# Patient Record
Sex: Female | Born: 1940 | Race: White | Hispanic: No | Smoking: Never smoker
Health system: Southern US, Community
[De-identification: ages and names within clinical notes are randomized; demographics above are authoritative.]

## PROBLEM LIST (undated history)

## (undated) DIAGNOSIS — F0391 Unspecified dementia with behavioral disturbance: Secondary | ICD-10-CM

## (undated) DIAGNOSIS — F0280 Dementia in other diseases classified elsewhere without behavioral disturbance: Secondary | ICD-10-CM

## (undated) DIAGNOSIS — F03918 Unspecified dementia, unspecified severity, with other behavioral disturbance: Secondary | ICD-10-CM

## (undated) DIAGNOSIS — R4701 Aphasia: Secondary | ICD-10-CM

## (undated) DIAGNOSIS — M810 Age-related osteoporosis without current pathological fracture: Secondary | ICD-10-CM

## (undated) DIAGNOSIS — F0390 Unspecified dementia without behavioral disturbance: Secondary | ICD-10-CM

## (undated) DIAGNOSIS — G309 Alzheimer's disease, unspecified: Secondary | ICD-10-CM

## (undated) HISTORY — PX: CATARACT EXTRACTION: SUR2

## (undated) HISTORY — DX: Dementia in other diseases classified elsewhere, unspecified severity, without behavioral disturbance, psychotic disturbance, mood disturbance, and anxiety: F02.80

## (undated) HISTORY — PX: COLONOSCOPY: SHX174

## (undated) HISTORY — DX: Alzheimer's disease, unspecified: G30.9

---

## 1998-07-02 ENCOUNTER — Other Ambulatory Visit: Admission: RE | Admit: 1998-07-02 | Discharge: 1998-07-02 | Payer: Self-pay | Admitting: *Deleted

## 1998-07-03 ENCOUNTER — Other Ambulatory Visit: Admission: RE | Admit: 1998-07-03 | Discharge: 1998-07-03 | Payer: Self-pay | Admitting: *Deleted

## 1998-09-05 ENCOUNTER — Other Ambulatory Visit: Admission: RE | Admit: 1998-09-05 | Discharge: 1998-09-05 | Payer: Self-pay | Admitting: *Deleted

## 2000-03-01 ENCOUNTER — Other Ambulatory Visit: Admission: RE | Admit: 2000-03-01 | Discharge: 2000-03-01 | Payer: Self-pay | Admitting: *Deleted

## 2001-06-13 ENCOUNTER — Other Ambulatory Visit: Admission: RE | Admit: 2001-06-13 | Discharge: 2001-06-13 | Payer: Self-pay | Admitting: *Deleted

## 2002-06-26 ENCOUNTER — Other Ambulatory Visit: Admission: RE | Admit: 2002-06-26 | Discharge: 2002-06-26 | Payer: Self-pay | Admitting: *Deleted

## 2004-03-06 ENCOUNTER — Other Ambulatory Visit: Admission: RE | Admit: 2004-03-06 | Discharge: 2004-03-06 | Payer: Self-pay | Admitting: *Deleted

## 2012-05-25 ENCOUNTER — Ambulatory Visit: Payer: Self-pay | Admitting: Family Medicine

## 2012-07-15 DIAGNOSIS — E039 Hypothyroidism, unspecified: Secondary | ICD-10-CM | POA: Insufficient documentation

## 2012-07-28 ENCOUNTER — Other Ambulatory Visit: Payer: Self-pay | Admitting: Chiropractor

## 2012-07-28 ENCOUNTER — Ambulatory Visit
Admission: RE | Admit: 2012-07-28 | Discharge: 2012-07-28 | Disposition: A | Discharge status: Home / Self Care | Payer: Medicare Other | Source: Ambulatory Visit | Attending: Chiropractor | Admitting: Chiropractor

## 2012-07-28 DIAGNOSIS — R52 Pain, unspecified: Secondary | ICD-10-CM

## 2014-03-23 ENCOUNTER — Ambulatory Visit: Payer: Self-pay | Admitting: Family Medicine

## 2014-03-23 LAB — URINALYSIS, COMPLETE
BACTERIA: NEGATIVE
BLOOD: NEGATIVE
Bilirubin,UR: NEGATIVE
GLUCOSE, UR: NEGATIVE mg/dL (ref 0–75)
Ketone: NEGATIVE
NITRITE: NEGATIVE
PH: 6 (ref 4.5–8.0)
PROTEIN: NEGATIVE
Specific Gravity: 1.02 (ref 1.003–1.030)

## 2014-05-15 DIAGNOSIS — M1990 Unspecified osteoarthritis, unspecified site: Secondary | ICD-10-CM | POA: Insufficient documentation

## 2015-07-23 DIAGNOSIS — R4701 Aphasia: Secondary | ICD-10-CM | POA: Insufficient documentation

## 2015-07-23 DIAGNOSIS — G309 Alzheimer's disease, unspecified: Secondary | ICD-10-CM | POA: Insufficient documentation

## 2016-04-07 ENCOUNTER — Ambulatory Visit: Payer: Medicare Other | Attending: Family Medicine | Admitting: Physical Therapy

## 2016-04-07 DIAGNOSIS — R293 Abnormal posture: Secondary | ICD-10-CM | POA: Insufficient documentation

## 2016-04-07 DIAGNOSIS — Z9181 History of falling: Secondary | ICD-10-CM

## 2016-04-07 DIAGNOSIS — M6281 Muscle weakness (generalized): Secondary | ICD-10-CM | POA: Insufficient documentation

## 2016-04-07 NOTE — Therapy (Signed)
Sydney Turner [address] [place], [place], [address] Phone: [phone]   Fax:  [phone]  Physical Therapy Evaluation  Patient Details  Name: Sydney Turner MRN: [id number] Date of Birth: [birth year] Referring Provider: Marlowe Alt Turner  Encounter Date: 04/07/2016      PT End of Session - 04/07/16 1709    Visit Number 1   Number of Visits 6   Date for PT Re-Evaluation 05/19/16   PT Start Time 0125   PT Stop Time 0225   PT Time Calculation (min) 60 min   Activity Tolerance Patient tolerated treatment well;Patient limited by fatigue   Behavior During Therapy [facility] for tasks assessed/performed      No past medical history on file.  No past surgical history on file.  There were no vitals filed for this visit.       Subjective Assessment - 04/07/16 1332    Subjective Pt states she has severe foot pain in L foot underneath MT heads due to plantar wart with extended periods of walking. 2 weeks ago she had wart removed. Pt states she gets exercise by walking the dog, moderate distance (approx mile). She states she has had 3 falls in the past with no injuries sustained. States tension in neck but no pain.   Patient is accompained by: Family member   Limitations House hold activities   Patient Stated Goals pt would like to increase strength and endurance to become indep with exercise program   Currently in Pain? No/denies            PT Long Term Goals - 04/07/16 1432    PT LONG TERM GOAL #1   Title Pt will be able to perform SLS on BLE for 2 minutes without UE support to improve balance and decrease fall risk   Baseline pt with LOB with SLS on LLE, able to perform 1 minute SLS on RLE   Time 6   Period Weeks   Status New   PT LONG TERM GOAL #2   Title Pt will improve MMT scores by half grade to progress strength and improve functional mobility   Baseline R/L hip flexion 4+/4, knee ext/flx 4+/5, plantarflex 4+/5,  shoulder flex 4+/5, biceps 4/4, triceps 4-/4   Time 6   Period Weeks   Status New   PT LONG TERM GOAL #3   Title Pt will be able to tolerate 30 minutes of continuous exercise in therapy clinic to promote independent exercise program post PT   Baseline pt needs to sit after 15 minutes using NuStep at level 5.   Time 6   Period Weeks   Status New      Objective: There ex: standing in // bars pt performed marching x30 B, donkey kicks x15 B, mini squats 2x15 B, calf raises 2x10, lateral walks with UE support 5x81ft. Nu Step 15 minutes level 5.   Pt response for medical necessity: Pt demonstrates mild weakness in LE/UE musculature, endurance deficits, balance deficits and tendency for excess thoracic kyphosis with sitting/standing posture. She will benefit from skilled PT program to address strength deficits, build functional endurance, increase dynamic/static balance ability and promote postural awareness with ultimate goal of progressing toward independent exercise program.         Plan - 04/07/16 1711    Clinical Impression Statement Pt is a pleasant 75 year old woman with dx of osteoarthritis of multiple joints. She presents for PT evaluation with pain in L  foot associated with minor foot surgery, but does not c/o pain limiting functional mobility. Strength testing: R/L hip flexion 4/4+, knee ext 5/5, knee flexion 5/4+, dorsiflexion 5/5, plantarflexion 4+/5, shoulder abd 5/5, flexion 4+/5, elbow flx 4/4, elbow ext 4/4-. ROM: cervical spine WFL for all planes with limited rotation due to "tightness", UE WFL all planes, lumbar flexion WFL, lumbar ext lacking 25%. Pt with moderate thoracic kyphosis, able to self correct 75% with scapular stabilizers. Balance: tandem stance >1 minute, SLS LLE <10 sec, SLS RLE >30sec. 5XSTS: 11.25s, TUG: 8.58sec. Pt will benefit from skilled PT services to address postural abnormality, progress strengthening program for UE/LE and incorporate static/dynamic balance  training to promote safety with household activities and exercise program.   Rehab Potential Good   PT Frequency 1x / week   PT Duration 6 weeks   PT Treatment/Interventions ADLs/Self Care Home Management;Aquatic Therapy;Cryotherapy;Moist Heat;Gait training;Stair training;Functional mobility training;Therapeutic activities;Therapeutic exercise;Balance training;Neuromuscular re-education;Patient/family education;Manual techniques;Passive range of motion   PT Next Visit Plan Progress strengthening program; emphasis on UE and neutral spine   Consulted and Agree with Plan of Care Patient;Family member/caregiver      Patient will benefit from skilled therapeutic intervention in order to improve the following deficits and impairments:  Decreased activity tolerance, Decreased balance, Decreased endurance, Decreased mobility, Decreased range of motion, Decreased safety awareness, Decreased strength, Hypomobility, Impaired perceived functional ability, Impaired flexibility, Improper body mechanics, Postural dysfunction, Pain  Visit Diagnosis: Muscle weakness (generalized)  Abnormal posture  History of falling     Problem List There are no active problems to display for this patient.  Sydney Turner, PT, DPT # [id number] Sydney Turner, SPT   04/07/2016, 5:25 PM   [facility] [address] [place], [place], [address] Phone: [phone]   Fax:  [phone]  Name: Sydney Turner MRN: [id number] Date of Birth: 19-Aug-1941

## 2016-04-08 ENCOUNTER — Encounter: Payer: Self-pay | Admitting: Physical Therapy

## 2016-04-14 ENCOUNTER — Encounter: Payer: Self-pay | Admitting: Physical Therapy

## 2016-04-14 ENCOUNTER — Ambulatory Visit: Payer: Medicare Other | Admitting: Physical Therapy

## 2016-04-14 DIAGNOSIS — M6281 Muscle weakness (generalized): Secondary | ICD-10-CM | POA: Diagnosis not present

## 2016-04-14 DIAGNOSIS — R293 Abnormal posture: Secondary | ICD-10-CM

## 2016-04-14 DIAGNOSIS — Z9181 History of falling: Secondary | ICD-10-CM

## 2016-04-14 NOTE — Therapy (Signed)
[facility] [address] [place], [place], [address] Phone: [phone]   Fax:  [phone]  Physical Therapy Treatment  Patient Details  Name: Sydney Turner MRN: [id number] Date of Birth: Oct 12, 1941 Referring Provider: Marlowe AltStephanie Jeanine Foley  Encounter Date: 04/14/2016      PT End of Session - 04/14/16 1806    Visit Number 2   Number of Visits 6   Date for PT Re-Evaluation 05/19/16   PT Start Time 1600   PT Stop Time 1647   PT Time Calculation (min) 47 min   Activity Tolerance Patient tolerated treatment well;Patient limited by fatigue   Behavior During Therapy [facility] for tasks assessed/performed      History reviewed. No pertinent past medical history.  History reviewed. No pertinent past surgical history.  There were no vitals filed for this visit.      Subjective Assessment - 04/14/16 1805    Subjective Pt reports that she is feeling well overall but did experience some muscle soreness following last therapy session.   Patient is accompained by: Family member   Limitations House hold activities   Patient Stated Goals pt would like to increase strength and endurance to become indep with exercise program   Currently in Pain? No/denies      Objective: Nu Step level 7, 15 minutes (warm up, no charge). In // bars (Scientist, physiologicalfacing mirror for visual feedback): bicep curls 2# 3x10, scap retractions red theraband 3x10 with emphasis on proper posture - "squeezing shoulder blades" and activating abdominals, shoulder flx/abd #2 3x10 BUE, punches with 2# on BUE with emphasis on open chest posture, standing hip 3 way (ant/lat/post) 2x15 bilat, standing abd 3x10 with cueing for neutral spine due to pt tendency for lateral lean, calf raises 3x10, standing donkey kicks 2x10, sit to stand 1x15. Pt with tendency to use momentum during there ex, requires cueing to slow down movement and focus on control. Pt requires frequent cueing for exercise set  up, but able to perform with supervision.  Pt response for medical necessity: Pt demonstrates mild weakness in LE/UE musculature, endurance deficits, balance deficits and tendency for excess thoracic kyphosis with sitting/standing posture. She will benefit from skilled PT program to address strength deficits, build functional endurance, increase dynamic/static balance ability and promote postural awareness with ultimate goal of progressing toward independent exercise program.        PT Long Term Goals - 04/07/16 1432    PT LONG TERM GOAL #1   Title Pt will be able to perform SLS on BLE for 2 minutes without UE support to improve balance and decrease fall risk   Baseline pt with LOB with SLS on LLE, able to perform 1 minute SLS on RLE   Time 6   Period Weeks   Status New   PT LONG TERM GOAL #2   Title Pt will improve MMT scores by half grade to progress strength and improve functional mobility   Baseline R/L hip flexion 4+/4, knee ext/flx 4+/5, plantarflex 4+/5, shoulder flex 4+/5, biceps 4/4, triceps 4-/4   Time 6   Period Weeks   Status New   PT LONG TERM GOAL #3   Title Pt will be able to tolerate 30 minutes of continuous exercise in therapy clinic to promote independent exercise program post PT   Baseline pt needs to sit after 15 minutes using NuStep at level 5.   Time 6   Period Weeks   Status New  Plan - 04/14/16 1806    Clinical Impression Statement Pt requires frequent cueing for posture with tendency for thoracic kyphosis during UE tasks. Pt requires frequent cueing for exercise set up with tendency for compensatory strategies with LE standing ther ex. Able to respond to verbal/visual cueing to correct exercises.   Rehab Potential Good   PT Frequency 1x / week   PT Duration 6 weeks   PT Treatment/Interventions ADLs/Self Care Home Management;Aquatic Therapy;Cryotherapy;Moist Heat;Gait training;Stair training;Functional mobility training;Therapeutic  activities;Therapeutic exercise;Balance training;Neuromuscular re-education;Patient/family education;Manual techniques;Passive range of motion   PT Next Visit Plan Progress strengthening program; emphasis on UE and neutral spine   Consulted and Agree with Plan of Care Patient;Family member/caregiver      Patient will benefit from skilled therapeutic intervention in order to improve the following deficits and impairments:  Decreased activity tolerance, Decreased balance, Decreased endurance, Decreased mobility, Decreased range of motion, Decreased safety awareness, Decreased strength, Hypomobility, Impaired perceived functional ability, Impaired flexibility, Improper body mechanics, Postural dysfunction, Pain  Visit Diagnosis: Muscle weakness (generalized)  Abnormal posture  History of falling     Problem List There are no active problems to display for this patient.  Cammie McgeeMichael C Sherk, PT, DPT # [id number] Michaelyn BarterLaura Dally Oshel, SPT  04/15/2016, 8:03 AM  [facility] [address] [place], [place], [address] Phone: [phone]   Fax:  [phone]  Name: Sydney Turner MRN: [id number] Date of Birth: 1941/01/28

## 2016-04-22 ENCOUNTER — Encounter: Payer: Self-pay | Admitting: Physical Therapy

## 2016-04-22 ENCOUNTER — Ambulatory Visit: Payer: Medicare Other | Attending: Family Medicine

## 2016-04-22 DIAGNOSIS — R293 Abnormal posture: Secondary | ICD-10-CM | POA: Diagnosis present

## 2016-04-22 DIAGNOSIS — Z9181 History of falling: Secondary | ICD-10-CM | POA: Diagnosis present

## 2016-04-22 DIAGNOSIS — M6281 Muscle weakness (generalized): Secondary | ICD-10-CM | POA: Diagnosis present

## 2016-04-22 NOTE — Therapy (Signed)
[facility] [address] [place], [place], [address] Phone: [phone]   Fax:  [phone]  Physical Therapy Treatment  Patient Details  Name: Sydney GumMargaret K Gellatly MRN: [id number] Date of Birth: July 12, 1941 Referring Provider: Marlowe AltStephanie Jeanine Foley  Encounter Date: 04/22/2016      PT End of Session - 04/22/16 1718    Visit Number 3   Number of Visits 6   Date for PT Re-Evaluation 05/19/16   PT Start Time 1415   PT Stop Time 1500   PT Time Calculation (min) 45 min   Activity Tolerance Patient tolerated treatment well   Behavior During Therapy [facility] for tasks assessed/performed      History reviewed. No pertinent past medical history.  History reviewed. No pertinent past surgical history.  There were no vitals filed for this visit.      Subjective Assessment - 04/22/16 1718    Subjective Pt states she enjoyed last therapy session and felt good about strengthening program. States that she is trying to be more aware of her overall posture during functional tasks.   Patient is accompained by: Family member   Limitations House hold activities   Patient Stated Goals pt would like to increase strength and endurance to become indep with exercise program   Currently in Pain? No/denies      Objective: Nu Step level 5, 15 minutes (warm up, no charge). In // bars (Scientist, physiologicalfacing mirror for visual feedback): bicep curls 2# 3x10, scap retractions red theraband 3x10 with emphasis on proper posture - "squeezing shoulder blades" and activating abdominals, shoulder flx/abd #2 3x10 BUE, punches with 2# on BUE with emphasis on open chest posture, D1/D2 pattern with #1 BUE 2x10, lumbar stretches ROT R/L LF R/L x10. Standing knee flexion #4 ankle weights 3x10 bilat. Standing marching #4 ankle weights 3x10 bilat. Pt with tendency to use momentum during there ex, requires cueing to slow down movement and focus on control. Pt requires frequent cueing for exercise  set up, but able to perform with supervision.  Pt response for medical necessity: Pt demonstrates mild weakness in LE/UE musculature, endurance deficits, balance deficits and tendency for excess thoracic kyphosis with sitting/standing posture. She will benefit from skilled PT program to address strength deficits, build functional endurance, increase dynamic/static balance ability and promote postural awareness with ultimate goal of progressing toward independent exercise program.      PT Long Term Goals - 04/07/16 1432    PT LONG TERM GOAL #1   Title Pt will be able to perform SLS on BLE for 2 minutes without UE support to improve balance and decrease fall risk   Baseline pt with LOB with SLS on LLE, able to perform 1 minute SLS on RLE   Time 6   Period Weeks   Status New   PT LONG TERM GOAL #2   Title Pt will improve MMT scores by half grade to progress strength and improve functional mobility   Baseline R/L hip flexion 4+/4, knee ext/flx 4+/5, plantarflex 4+/5, shoulder flex 4+/5, biceps 4/4, triceps 4-/4   Time 6   Period Weeks   Status New   PT LONG TERM GOAL #3   Title Pt will be able to tolerate 30 minutes of continuous exercise in therapy clinic to promote independent exercise program post PT   Baseline pt needs to sit after 15 minutes using NuStep at level 5.   Time 6   Period Weeks   Status New  Plan - 04/22/16 1719    Clinical Impression Statement Pt continues to require frequent cueing to promote neutral spine especially during reaching tasks; responds best to tactile cueing on anterior shoulder/scapula. Demonstrates good response to progressive strengthening program with no c/o of pain or discomfort during session.   Rehab Potential Good   PT Frequency 1x / week   PT Duration 6 weeks   PT Treatment/Interventions ADLs/Self Care Home Management;Aquatic Therapy;Cryotherapy;Moist Heat;Gait training;Stair training;Functional mobility training;Therapeutic  activities;Therapeutic exercise;Balance training;Neuromuscular re-education;Patient/family education;Manual techniques;Passive range of motion   PT Next Visit Plan Progress strengthening program; emphasis on UE and neutral spine   Consulted and Agree with Plan of Care Patient;Family member/caregiver      Patient will benefit from skilled therapeutic intervention in order to improve the following deficits and impairments:  Decreased activity tolerance, Decreased balance, Decreased endurance, Decreased mobility, Decreased range of motion, Decreased safety awareness, Decreased strength, Hypomobility, Impaired perceived functional ability, Impaired flexibility, Improper body mechanics, Postural dysfunction, Pain  Visit Diagnosis: Muscle weakness (generalized)  Abnormal posture  History of falling     Problem List There are no active problems to display for this patient.  This entire session was performed under direct supervision and direction of a licensed therapist/therapist assistant . I have personally read, edited and approve of the note as written.   Vernona RiegerLaura Anson Peddie, SPT Huprich,Jason DPT 04/23/2016, 11:37 AM  [place] [facility] [address] [place], [place], [address] Phone: [phone]   Fax:  [phone]  Name: Sydney GumMargaret K Mayberry MRN: [id number] Date of Birth: 1941-05-30

## 2016-04-29 ENCOUNTER — Ambulatory Visit: Payer: Medicare Other

## 2016-04-29 DIAGNOSIS — M6281 Muscle weakness (generalized): Secondary | ICD-10-CM

## 2016-04-29 DIAGNOSIS — Z9181 History of falling: Secondary | ICD-10-CM

## 2016-04-29 DIAGNOSIS — R293 Abnormal posture: Secondary | ICD-10-CM

## 2016-04-29 NOTE — Therapy (Signed)
[facility] [facility] [address] [place], [place], [address] Phone: [phone]   Fax:  [phone]  Physical Therapy Treatment  Patient Details  Name: Sydney Turner MRN: [id number] Date of Birth: [birth year] Referring Provider: Marlowe Alt Foley  Encounter Date: 04/29/2016      PT End of Session - 04/29/16 1708    Visit Number 4   Number of Visits 6   Date for PT Re-Evaluation 05/19/16   PT Start Time 1415   PT Stop Time 1500   PT Time Calculation (min) 45 min   Activity Tolerance Patient tolerated treatment well   Behavior During Therapy [facility] for tasks assessed/performed      History reviewed. No pertinent past medical history.  History reviewed. No pertinent past surgical history.  There were no vitals filed for this visit.      Subjective Assessment - 04/29/16 1706    Subjective Pt states she is doing well and has been walking the dog regularly. Reports no increase in muscle soreness or pain folllowing last session and states she is ready for more therapy.   Patient is accompained by: Family member   Limitations House hold activities   Patient Stated Goals pt would like to increase strength and endurance to become indep with exercise program   Currently in Pain? No/denies      Objective: Nu Step level 6, 15 minutes (warm up, no charge). In // bars (Scientist, physiological for visual feedback): bicep curls 2# 3x10, scap retractions red theraband 3x10 with emphasis on proper posture - "squeezing shoulder blades" and activating abdominals, shoulder flx/abd #2 3x10 BUE, punches with 2# on BUE with emphasis on open chest posture, ER/IR 2x10 with yellow theraband - pt limited in range with tendency for wrist flexion, lumbar stretches ROT R/L LF R/L x10. Standing marching 3x10 bilat. Standing R/L abduction and extension 2x10 with emphasis on neutral lumbar spine and UE prn. Standing stretches: gastroc stretch 45sec bilaterally, hamstring  stretch 45sec bilaterally, quad stretch 45sec bilaterally. Pt reporting tension in posterior leg during hamstring stretch R>L. Pt with tendency to use momentum during there ex, requires cueing to slow down movement and focus on control. Pt requires frequent cueing for exercise set up and benefits from tactile cueing.  Pt response for medical necessity: Pt demonstrates mild weakness in LE/UE musculature, endurance deficits, balance deficits and tendency for excess thoracic kyphosis with sitting/standing posture. She will benefit from skilled PT program to address strength deficits, build functional endurance, increase dynamic/static balance ability and promote postural awareness with ultimate goal of progressing toward independent exercise program.       PT Education - 04/29/16 1707    Education provided Yes   Education Details Emphasis on posture - neutral spine during LE ther ex and scapular retraction during UE ther ex   Person(s) Educated Patient   Methods Explanation   Comprehension Verbalized understanding;Returned demonstration             PT Long Term Goals - 04/07/16 1432    PT LONG TERM GOAL #1   Title Pt will be able to perform SLS on BLE for 2 minutes without UE support to improve balance and decrease fall risk   Baseline pt with LOB with SLS on LLE, able to perform 1 minute SLS on RLE   Time 6   Period Weeks   Status New   PT LONG TERM GOAL #2   Title Pt will improve MMT scores by half grade  to progress strength and improve functional mobility   Baseline R/L hip flexion 4+/4, knee ext/flx 4+/5, plantarflex 4+/5, shoulder flex 4+/5, biceps 4/4, triceps 4-/4   Time 6   Period Weeks   Status New   PT LONG TERM GOAL #3   Title Pt will be able to tolerate 30 minutes of continuous exercise in therapy clinic to promote independent exercise program post PT   Baseline pt needs to sit after 15 minutes using NuStep at level 5.   Time 6   Period Weeks   Status New                Plan - 04/29/16 1708    Clinical Impression Statement Pt responsive to verbal/tactile cueing to address correct posture for therapeutic exercise. She responds best to imitation in exercise instruction and benefits from tactile cueing for any corrections. She continues to require cueing for thoracic posture during UE tasks and maintaning neutral lumbar spine during LE tasks. Demonstrates weakness of shoulder ER/IR with targeted ER/IR with yellow theraband.   Rehab Potential Good   PT Frequency 1x / week   PT Duration 6 weeks   PT Treatment/Interventions ADLs/Self Care Home Management;Aquatic Therapy;Cryotherapy;Moist Heat;Gait training;Stair training;Functional mobility training;Therapeutic activities;Therapeutic exercise;Balance training;Neuromuscular re-education;Patient/family education;Manual techniques;Passive range of motion   PT Next Visit Plan Progress strengthening program; emphasis on UE and neutral spine   Consulted and Agree with Plan of Care Patient;Family member/caregiver      Patient will benefit from skilled therapeutic intervention in order to improve the following deficits and impairments:  Decreased activity tolerance, Decreased balance, Decreased endurance, Decreased mobility, Decreased range of motion, Decreased safety awareness, Decreased strength, Hypomobility, Impaired perceived functional ability, Impaired flexibility, Improper body mechanics, Postural dysfunction, Pain  Visit Diagnosis: Muscle weakness (generalized)  Abnormal posture  History of falling     Problem List There are no active problems to display for this patient.  Sydney Turner SPT Sydney Turner,Sydney Turner 04/30/2016, 10:35 AM  [place] [facility] [address] [place], [place], [address] Phone: [phone]   Fax:  [phone]  Name: Sydney Turner MRN: [id number] Date of Birth: 1941-07-14

## 2016-05-06 ENCOUNTER — Ambulatory Visit: Payer: Medicare Other | Admitting: Physical Therapy

## 2016-05-06 ENCOUNTER — Encounter: Payer: Self-pay | Admitting: Physical Therapy

## 2016-05-06 DIAGNOSIS — Z9181 History of falling: Secondary | ICD-10-CM

## 2016-05-06 DIAGNOSIS — M6281 Muscle weakness (generalized): Secondary | ICD-10-CM | POA: Diagnosis not present

## 2016-05-06 DIAGNOSIS — R293 Abnormal posture: Secondary | ICD-10-CM

## 2016-05-06 NOTE — Therapy (Signed)
[facility] [address] [place], [place], [address] Phone: [phone]   Fax:  [phone]  Physical Therapy Treatment  Patient Details  Name: Prescott GumMargaret K Benscoter MRN: [id number] Date of Birth: 07-Jul-1941 Referring Provider: Marlowe AltStephanie Jeanine Foley  Encounter Date: 05/06/2016      PT End of Session - 05/06/16 1509    Visit Number 5   Number of Visits 6   Date for PT Re-Evaluation 05/19/16   PT Start Time 1402   PT Stop Time 1447   PT Time Calculation (min) 45 min   Activity Tolerance Patient tolerated treatment well   Behavior [facility] for tasks assessed/performed      History reviewed. No pertinent past medical history.  History reviewed. No pertinent past surgical history.  There were no vitals filed for this visit.      Subjective Assessment - 05/06/16 1508    Subjective Pt arrives at PT session wearing clogs instead of gym shoes. States that her husband forgot to remind her to change shoes. States that she got a massage and saw reflexologist yesterday.   Patient is accompained by: Family member   Limitations House hold activities   Patient Stated Goals pt would like to increase strength and endurance to become indep with exercise program   Currently in Pain? No/denies      Objective: SciFit level 6, 15 minutes (warm up, no charge). In // bars (Scientist, physiologicalfacing mirror for visual feedback): bicep curls 2# 3x10, scap retractions red theraband 3x10 with emphasis on proper posture - "squeezing shoulder blades" and activating abdominals, punches with 2# on BUE with emphasis on open chest posture, tricep ext 2x10 with yellow theraband - pt with tendency for wrist flexion and difficulty performing UE tasks without moderate-max verbal/visual/tactile cueing this session. Standing marching 3x10 bilat. Mini squat x30 with emphasis on hip hinge and reduced knee flexion; verbal cueing to increase base of support. Heel raises x30 with UE  support on // bars prn. Standing stretches: gastroc stretch 45sec bilaterally, hamstring stretch 45sec bilaterally, quad stretch 45sec bilaterally.   Pt response for medical necessity: Pt demonstrates mild weakness in LE/UE musculature, endurance deficits, balance deficits and tendency for excess thoracic kyphosis with sitting/standing posture. She will benefit from skilled PT program to address strength deficits, build functional endurance, increase dynamic/static balance ability and promote postural awareness with ultimate goal of progressing toward independent exercise program.       PT Long Term Goals - 04/07/16 1432    PT LONG TERM GOAL #1   Title Pt will be able to perform SLS on BLE for 2 minutes without UE support to improve balance and decrease fall risk   Baseline pt with LOB with SLS on LLE, able to perform 1 minute SLS on RLE   Time 6   Period Weeks   Status New   PT LONG TERM GOAL #2   Title Pt will improve MMT scores by half grade to progress strength and improve functional mobility   Baseline R/L hip flexion 4+/4, knee ext/flx 4+/5, plantarflex 4+/5, shoulder flex 4+/5, biceps 4/4, triceps 4-/4   Time 6   Period Weeks   Status New   PT LONG TERM GOAL #3   Title Pt will be able to tolerate 30 minutes of continuous exercise in therapy clinic to promote independent exercise program post PT   Baseline pt needs to sit after 15 minutes using NuStep at level 5.   Time 6  Period Weeks   Status New            Plan - 05/06/16 1510    Clinical Impression Statement Pt demonstrates moderate difficulty performing exercises today with need for repeated verbal/tactile/visual cueing for majority of ther ex. She requires repeated cueing for postural correction with difficulty with carry over. Demonstrates functional UE/LE strength and overrall good mobility.    Rehab Potential Good   PT Frequency 1x / week   PT Duration 6 weeks   PT Treatment/Interventions ADLs/Self Care Home  Management;Aquatic Therapy;Cryotherapy;Moist Heat;Gait training;Stair training;Functional mobility training;Therapeutic activities;Therapeutic exercise;Balance training;Neuromuscular re-education;Patient/family education;Manual techniques;Passive range of motion   PT Next Visit Plan Progress strengthening program; emphasis on UE and neutral spine   Consulted and Agree with Plan of Care Patient;Family member/caregiver      Patient will benefit from skilled therapeutic intervention in order to improve the following deficits and impairments:  Decreased activity tolerance, Decreased balance, Decreased endurance, Decreased mobility, Decreased range of motion, Decreased safety awareness, Decreased strength, Hypomobility, Impaired perceived functional ability, Impaired flexibility, Improper body mechanics, Postural dysfunction, Pain  Visit Diagnosis: Muscle weakness (generalized)  Abnormal posture  History of falling     Problem List There are no active problems to display for this patient.  Cammie Mcgee, PT, DPT # [id number] Vernona Rieger Betta Balla SPT 05/06/2016, 3:12 PM  [facility] [address] [place], [place], [address] Phone: [phone]   Fax:  [phone]  Name: FERNANDO TORRY MRN: [id number] Date of Birth: 1941-08-11

## 2016-05-13 ENCOUNTER — Ambulatory Visit: Payer: Medicare Other | Admitting: Physical Therapy

## 2016-05-13 ENCOUNTER — Encounter: Payer: Self-pay | Admitting: Physical Therapy

## 2016-05-13 DIAGNOSIS — M6281 Muscle weakness (generalized): Secondary | ICD-10-CM

## 2016-05-13 DIAGNOSIS — Z9181 History of falling: Secondary | ICD-10-CM

## 2016-05-13 DIAGNOSIS — R293 Abnormal posture: Secondary | ICD-10-CM

## 2016-05-13 NOTE — Therapy (Addendum)
[facility] [address] [place], [place], [address] Phone: [phone]   Fax:  [phone]  Physical Therapy Treatment/Discharge Summary  Patient Details  Name: Sydney Turner MRN: [id number] Date of Birth: 03/30/1941 Referring Provider: Maryagnes Amos Foley  Encounter Date: 05/13/2016      PT End of Session - 05/13/16 1750    Visit Number 6   Number of Visits 6   Date for PT Re-Evaluation 05/19/16   PT Start Time 7026   PT Stop Time 1501   PT Time Calculation (min) 49 min   Activity Tolerance Patient tolerated treatment well   Behavior During Therapy [facility] for tasks assessed/performed      History reviewed. No pertinent past medical history.  History reviewed. No pertinent surgical history.  There were no vitals filed for this visit.      Subjective Assessment - 05/13/16 1748    Subjective Pt states she is wearing the correct shoes for physical therapy today and remembered to wear her sneakers. States that the hives on her LE have resolved and she is back to walking the dog regularly.    Patient is accompained by: Family member   Limitations House hold activities   Patient Stated Goals pt would like to increase strength and endurance to become indep with exercise program   Currently in Pain? No/denies      Objective: Nu Step level 5, 15 minutes (warm up, no charge). In // bars (Press photographer for visual feedback): bicep curls with red theraband 2x10, scap retractions 3x10 with emphasis on proper posture; modified punches with 2# on BUE with emphasis on open chest posture, shoulder ext 2x10 with red theraband - pt benefits from imitation of husband when performing majority of UE exercise. Standing hip abd/hip ext 3x10 bilat with cueing for neutral spine to limit compensations; pt with tendency for lateral trunk lean when performing hip abd. Mini squat x30 with emphasis on hip hinge. Heel raises x30 with UE support on //  bars prn; pt unable to perform single leg heel raise but with good tolerance of bilateral heel raise and able to perform without UE assist.  Pt response for medical necessity: Pt demonstrates improvement in LE strength and balance with supervised physical therapy. She is able to perform HEP without therapist supervision but with support of her husband for proper set up. Will progress to home based program to promote longterm functional mobility.       PT Education - 05/13/16 1750    Education provided Yes   Education Details HEP issued   Person(s) Educated Patient   Methods Explanation;Demonstration;Handout   Comprehension Verbalized understanding;Returned demonstration;Verbal cues required;Tactile cues required             PT Long Term Goals - 05/13/16 1752      PT LONG TERM GOAL #1   Title Pt will be able to perform SLS on BLE for 2 minutes without UE support to improve balance and decrease fall risk   Baseline pt with LOB with SLS on LLE, able to perform 1 minute SLS on RLE   Time 6   Period Weeks   Status Partially Met     PT LONG TERM GOAL #2   Title Pt will improve MMT scores by half grade to progress strength and improve functional mobility   Baseline R/L hip flexion 4+/4, knee ext/flx 4+/5, plantarflex 4+/5, shoulder flex 4+/5, biceps 4/4, triceps 4-/4   Time 6   Period  Weeks   Status Achieved     PT LONG TERM GOAL #3   Title Pt will be able to tolerate 30 minutes of continuous exercise in therapy clinic to promote independent exercise program post PT   Baseline pt needs to sit after 15 minutes using NuStep at level 5.   Time 6   Period Weeks   Status Achieved               Plan - 06/02/2016 1751    Clinical Impression Statement Pt demonstrates improvement in LE strength and balance; able to perform SLS on LLE for >1 minutes without balance check. She is able to perform all HEP exercises with good technique and benefits from imitation of her  husband/therapist when performing new exercises.    Rehab Potential Good   PT Frequency 1x / week   PT Duration 6 weeks   PT Treatment/Interventions ADLs/Self Care Home Management;Aquatic Therapy;Cryotherapy;Moist Heat;Gait training;Stair training;Functional mobility training;Therapeutic activities;Therapeutic exercise;Balance training;Neuromuscular re-education;Patient/family education;Manual techniques;Passive range of motion   PT Home Exercise Plan HEP issued   Consulted and Agree with Plan of Care Patient;Family member/caregiver   Family Member Consulted Husband      Patient will benefit from skilled therapeutic intervention in order to improve the following deficits and impairments:  Decreased activity tolerance, Decreased balance, Decreased endurance, Decreased mobility, Decreased range of motion, Decreased safety awareness, Decreased strength, Hypomobility, Impaired perceived functional ability, Impaired flexibility, Improper body mechanics, Postural dysfunction, Pain  Visit Diagnosis: Muscle weakness (generalized)  Abnormal posture  History of falling       G-Codes - June 02, 2016 1659    Functional Assessment Tool Used Clinical impression/ gait/ muscle strength/ pain   Functional Limitation Mobility: Walking and moving around   Mobility: Walking and Moving Around Current Status ([id number]) At least 1 percent but less than 20 percent impaired, limited or restricted   Mobility: Walking and Moving Around Goal Status [phone]) At least 1 percent but less than 20 percent impaired, limited or restricted   Mobility: Walking and Moving Around Discharge Status [phone]) At least 1 percent but less than 20 percent impaired, limited or restricted      Problem List There are no active problems to display for this patient.  Pura Spice, PT, DPT # [id number] Pura Spice SPT 05/14/2016, 2:00 PM  [facility] [address] [place], [place],  [address] Phone: [phone]   Fax:  [phone]  Name: Sydney Turner MRN: [id number] Date of Birth: 11-10-1940

## 2016-05-21 ENCOUNTER — Ambulatory Visit: Payer: Medicare Other | Attending: Neurology | Admitting: Speech Pathology

## 2016-05-21 DIAGNOSIS — R4701 Aphasia: Secondary | ICD-10-CM | POA: Insufficient documentation

## 2016-05-21 DIAGNOSIS — R41841 Cognitive communication deficit: Secondary | ICD-10-CM

## 2016-05-22 ENCOUNTER — Encounter: Payer: Self-pay | Admitting: Speech Pathology

## 2016-05-22 NOTE — Therapy (Signed)
[facility] SERVICES [address] [place], [place], [address] Phone: [phone]   Fax:  [phone]  Speech Language Pathology Evaluation  Patient Details  Name: Sydney Turner MRN: [id number] Date of Birth: [birth year] Referring Provider: Dr. Malvin Johns  Encounter Date: 05/21/2016      End of Session - 05/22/16 1417    Visit Number 1   Number of Visits 17   Date for SLP Re-Evaluation 07/24/16   SLP Start Time 1500   SLP Stop Time  1600   SLP Time Calculation (min) 60 min   Activity Tolerance Patient tolerated treatment well      Past Medical History:  Diagnosis Date  . Alzheimer's dementia    With Primary Progressive Aphasia    History reviewed. No pertinent surgical history.  There were no vitals filed for this visit.          SLP Evaluation [facility] - 05/22/16 0001      SLP Visit Information   SLP Received On 05/21/16   Referring Provider Dr. 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   Patient/Family Stated Goal Maximize communication     Pain Assessment   Currently in Pain? No/denies     Prior Functional Status   Cognitive/Linguistic Baseline Baseline deficits   Baseline deficit details Alzheimer's dementia, PPA     Cognition   Overall Cognitive Status History of cognitive impairments - at baseline     Auditory Comprehension   Overall Auditory Comprehension Impaired   Overall Auditory Comprehension Comments Comprehension decreases as length and complexity of verbal information increases     Reading Comprehension   Reading Status Impaired     Expression   Primary Mode of Expression Verbal     Verbal Expression   Overall Verbal Expression Impaired     Oral Motor/Sensory Function   Overall Oral Motor/Sensory Function Appears  within functional limits for tasks assessed     Motor Speech   Overall Motor Speech Appears within functional limits for tasks assessed     Standardized Assessments   Standardized Assessments  Western Aphasia Battery revised      Western Aphasia Battery- Revised   Spontaneous Speech      Information content  8/10       Fluency   6/10      Comprehension     Yes/No questions  54/60        Auditory Word Recognition 50/60        Sequential Commands 6/80     Repetition   44/100      Naming    Object Naming  51/60        Word Fluency   3/20        Sentence Completion 8/10        Responsive Speech   8/10      Aphasia Quotient  61.8/100    Reading and Writing    Reading   testing incomplete        Writing   testing incomplete           SLP Education - 05/22/16 1416    Education provided Yes   Education Details Role of SLP in treatment of PPA   Person(s) Educated Patient   Methods Explanation   Comprehension Verbalized understanding  SLP Long Term Goals - Jun 01, 2016 1419      SLP LONG TERM GOAL #1   Title Patient will complete 2 unit processing tasks with 80% accuracy without the need of repetition of task instructions or significant delays in responding.   Time 8   Period Weeks   Status New     SLP LONG TERM GOAL #2   Title Patient will complete semantic feature word finding tasks with 80% accuracy.   Time 8   Period Weeks   Status New     SLP LONG TERM GOAL #3   Title Patient will demonstrate reading comprehension for sentences with 80% accuracy.    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  The  patient will benefit from skilled speech therapy for restorative and compensatory treatment of aphasia as well as patient/family education for maximizing communicative effectiveness.   Speech Therapy Frequency 2x / week   Duration Other (comment)  8 weeks   Treatment/Interventions Patient/family education;Compensatory strategies;Multimodal communcation approach;Internal/external aids;Functional tasks   Potential to Achieve Goals Good   Potential Considerations Ability to learn/carryover information;Co-morbidities;Cooperation/participation level;Previous level of function;Severity of impairments;Family/community support   SLP Home Exercise Plan TBD   Consulted and Agree with Plan of Care Patient;Family member/caregiver   Family Member Consulted Spouse      Patient will benefit from skilled therapeutic intervention in order to improve the following deficits and impairments:   Aphasia - Plan: SLP plan of care cert/re-cert  Cognitive communication deficit - Plan: SLP plan of care cert/re-cert      G-Codes - 06-01-2016 1421    Functional Assessment Tool Used Western Aphasia Battery- Revised, clinical judgment   Functional Limitations Spoken language expressive   Spoken Language Expression Current Status [phone]) At least 40 percent but less than 60 percent impaired, limited or restricted   Spoken Language Expression Goal Status (K1601) At least 20 percent but less than 40 percent impaired, limited or restricted      Problem List There are no active problems to display for this patient.  Sydney Primrose, MS/CCC- SLP  Leandrew Koyanagi 06/01/16, 2:23 PM   [facility] MAIN [facility] SERVICES [address] [place], [place], [address] Phone: [phone]   Fax:  [phone]  Name: Sydney Turner MRN: [id number] Date of Birth: 11-18-1940

## 2016-05-25 ENCOUNTER — Ambulatory Visit: Payer: Medicare Other | Admitting: Speech Pathology

## 2016-05-25 DIAGNOSIS — R4701 Aphasia: Secondary | ICD-10-CM | POA: Diagnosis not present

## 2016-05-25 DIAGNOSIS — R41841 Cognitive communication deficit: Secondary | ICD-10-CM

## 2016-05-26 ENCOUNTER — Encounter: Payer: Self-pay | Admitting: Speech Pathology

## 2016-05-26 NOTE — Therapy (Signed)
[place] [facility] MAIN [facility] SERVICES [address] [place], [place], [address] Phone: [phone]   Fax:  [phone]  Speech Language Pathology Treatment  Patient Details  Name: KARISHMA UNREIN MRN: [id number] Date of Birth: 09/05/1941 Referring Provider: Dr. Malvin Johns  Encounter Date: 05/25/2016      End of Session - 05/26/16 1635    Visit Number 2   Number of Visits 17   Date for SLP Re-Evaluation 07/24/16   SLP Start Time 1500   SLP Stop Time  1600   SLP Time Calculation (min) 60 min   Activity Tolerance Patient tolerated treatment well      Past Medical History:  Diagnosis Date  . Alzheimer's dementia    With Primary Progressive Aphasia    History reviewed. No pertinent surgical history.  There were no vitals filed for this visit.      Subjective Assessment - 05/26/16 1634    Subjective Patient requests language exercise home program   Patient is accompained by: Family member   Currently in Pain? No/denies               ADULT SLP TREATMENT - 05/26/16 0001      General Information   Behavior/Cognition Alert;Cooperative;Pleasant mood     Treatment Provided   Treatment provided Cognitive-Linquistic     Pain Assessment   Pain Assessment No/denies pain     Cognitive-Linquistic Treatment   Treatment focused on Aphasia;Cognition;Patient/family/caregiver education   Skilled Treatment READING/WRITING: completed Western Aphasia Battery reading and writing subtests. Patient requires significant support to complete reading and writing tasks at the word and sentence level.  AUDITORY COMPREHENSION: complete one unit listening tasks with 90% accuracy.  VERBAL EXPRESSION: answer semantic analysis questions with 50% accuracy independently.     Assessment / Recommendations / Plan   Plan Continue with current plan of care     Progression Toward Goals   Progression toward goals Progressing toward goals          SLP Education -  05/26/16 1635    Education provided Yes   Education Details Role of practice in maintaining skills   Person(s) Educated Patient;Spouse   Methods Explanation   Comprehension Verbalized understanding            SLP Long Term Goals - 05/22/16 1419      SLP LONG TERM GOAL #1   Title Patient will complete 2 unit processing tasks with 80% accuracy without the need of repetition of task instructions or significant delays in responding.   Time 8   Period Weeks   Status New     SLP LONG TERM GOAL #2   Title Patient will complete semantic feature word finding tasks with 80% accuracy.   Time 8   Period Weeks   Status New     SLP LONG TERM GOAL #3   Title Patient will demonstrate reading comprehension for sentences with 80% accuracy.    Time 8   Period Weeks   Status New          Plan - 05/26/16 1636    Clinical Impression Statement The patient is eager to continue to practice her language skills and continue to communicate effectively with family and friends.   Speech Therapy Frequency 2x / week   Duration Other (comment)   Treatment/Interventions Patient/family education;Compensatory strategies;Multimodal communcation approach;Internal/external aids;Functional tasks   Potential to Achieve Goals Good   Potential Considerations Ability to learn/carryover information;Co-morbidities;Cooperation/participation level;Previous level of function;Severity of impairments;Family/community support  SLP Home Exercise Plan TBD   Consulted and Agree with Plan of Care Patient;Family member/caregiver      Patient will benefit from skilled therapeutic intervention in order to improve the following deficits and impairments:   Aphasia  Cognitive communication deficit    Problem List There are no active problems to display for this patient.  Dollene PrimroseSusan G Ludie Pavlik, MS/CCC- SLP  Leandrew KoyanagiAbernathy, Susie 05/26/2016, 4:36 PM  [facility] MAIN [facility] [address] [place], [place], [address] Phone: [phone]   Fax:  [phone]   Name: Prescott GumMargaret K Alfrey MRN: [id number] Date of Birth: 1941/10/04

## 2016-05-28 ENCOUNTER — Ambulatory Visit: Payer: Medicare Other | Admitting: Speech Pathology

## 2016-05-28 DIAGNOSIS — R4701 Aphasia: Secondary | ICD-10-CM | POA: Diagnosis not present

## 2016-05-28 DIAGNOSIS — R41841 Cognitive communication deficit: Secondary | ICD-10-CM

## 2016-05-29 ENCOUNTER — Encounter: Payer: Self-pay | Admitting: Speech Pathology

## 2016-05-29 NOTE — Therapy (Signed)
[place] [facility] [address] [place], [place], [address] Phone: [phone]   Fax:  [phone]  Speech Language Pathology Treatment  Patient Details  Name: Sydney Turner MRN: [id number] Date of Birth: 03-May-1941 Referring Provider: Dr. Malvin JohnsPotter  Encounter Date: 05/28/2016      End of Session - 05/29/16 1244    Visit Number 3   Number of Visits 17   Date for SLP Re-Evaluation 07/24/16   SLP Start Time 1300   SLP Stop Time  1400   SLP Time Calculation (min) 60 min      Past Medical History:  Diagnosis Date  . Alzheimer's dementia    With Primary Progressive Aphasia    History reviewed. No pertinent surgical history.  There were no vitals filed for this visit.      Subjective Assessment - 05/29/16 1241    Subjective Patient requests language exercise home program and tips for her family   Patient is accompained by: Family member   Currently in Pain? No/denies               ADULT SLP TREATMENT - 05/29/16 0001      General Information   Behavior/Cognition Alert;Cooperative;Pleasant mood     Treatment Provided   Treatment provided Cognitive-Linquistic     Pain Assessment   Pain Assessment No/denies pain     Cognitive-Linquistic Treatment   Treatment focused on Aphasia;Cognition;Patient/family/caregiver education   Skilled Treatment READING/WRITING: Patient requires significant support to complete reading and writing tasks at the word and sentence level.  Written cues did not aid patient's memory for task at hand.  VERBAL EXPRESSION: attempted variety of word retrieval exercises; patient did not retain task rules and was not easily cued for targeted response.  PATIENT/FAMILY EDUCATION: Patient[facility]  The patient demonstrates significant decline in language skills.  Her Aphasia Quotient (Western Aphasia Battery- Revised) went for 83.5 (mild aphasia) to 61.8  (moderate aphasia).     Assessment / Recommendations / Plan   Plan Continue with current plan of care     Progression Toward Goals   Progression toward goals Progressing toward goals          SLP Education - 05/29/16 1244    Education provided Yes   Education Details Review documentation from ST a year ago   Person(s) Educated Patient;Spouse   Methods Explanation   Comprehension Verbalized understanding            SLP Long Term Goals - 05/22/16 1419      SLP LONG TERM GOAL #1   Title Patient will complete 2 unit processing tasks with 80% accuracy without the need of repetition of task instructions or significant delays in responding.   Time 8   Period Weeks   Status New     SLP LONG TERM GOAL #2   Title Patient will complete semantic feature word finding tasks with 80% accuracy.   Time 8   Period Weeks   Status New     SLP LONG TERM GOAL #3   Title Patient will demonstrate reading comprehension for sentences with 80% accuracy.    Time 8   Period Weeks   Status New          Plan - 05/29/16 1245    Clinical Impression Statement The patient is eager to continue to practice her language skills and continue to communicate effectively with family and friends.   Speech Therapy Frequency  2x / week   Duration Other (comment)   Treatment/Interventions Patient/family education;Compensatory strategies;Multimodal communcation approach;Internal/external aids;Functional tasks   Potential to Achieve Goals Good   Potential Considerations Ability to learn/carryover information;Co-morbidities;Cooperation/participation level;Previous level of function;Severity of impairments;Family/community support   Consulted and Agree with Plan of Care Patient;Family member/caregiver   Family Member Consulted Spouse      Patient will benefit from skilled therapeutic intervention in order to improve the following deficits and impairments:   Aphasia  Cognitive communication  deficit    Problem List There are no active problems to display for this patient.  Dollene Primrose, MS/CCC- SLP  Leandrew Koyanagi 05/29/2016, 12:46 PM  [place] [facility] MAIN [facility] SERVICES [address] [place], [place], [address] Phone: [phone]   Fax:  [phone]   Name: Sydney Turner MRN: [id number] Date of Birth: 02/12/1941

## 2016-06-01 ENCOUNTER — Encounter: Payer: 59 | Admitting: Speech Pathology

## 2016-06-01 ENCOUNTER — Encounter: Payer: Medicare Other | Admitting: Speech Pathology

## 2016-06-02 ENCOUNTER — Ambulatory Visit: Payer: Medicare Other | Admitting: Speech Pathology

## 2016-06-02 DIAGNOSIS — R4701 Aphasia: Secondary | ICD-10-CM

## 2016-06-02 DIAGNOSIS — R41841 Cognitive communication deficit: Secondary | ICD-10-CM

## 2016-06-03 ENCOUNTER — Encounter: Payer: Self-pay | Admitting: Speech Pathology

## 2016-06-03 ENCOUNTER — Encounter: Payer: Medicare Other | Admitting: Speech Pathology

## 2016-06-03 NOTE — Therapy (Signed)
[place] MAIN [facility] SERVICES [address] [place], [place], [address] Phone: [phone]   Fax:  [phone]  Speech Language Pathology Treatment/Discharge Summary  Patient Details  Name: Sydney Turner MRN: [id number] Date of Birth: 24-Aug-1941 Referring Provider: Dr. Melrose Nakayama  Encounter Date: 06/02/2016      End of Session - 06/03/16 0929    Visit Number 4   Number of Visits 17   Date for SLP Re-Evaluation 07/24/16   SLP Start Time 59   SLP Stop Time  1640   SLP Time Calculation (min) 40 min   Activity Tolerance Patient tolerated treatment well      Past Medical History:  Diagnosis Date  . Alzheimer's dementia    With Primary Progressive Aphasia    History reviewed. No pertinent surgical history.  There were no vitals filed for this visit.      Subjective Assessment - 06/03/16 0927    Subjective Patient and spouse request discharge from speech therapy.   Patient is accompained by: Family member   Currently in Pain? No/denies               ADULT SLP TREATMENT - 06/03/16 0001      General Information   Behavior/Cognition Alert;Cooperative;Pleasant mood     Treatment Provided   Treatment provided Cognitive-Linquistic     Pain Assessment   Pain Assessment No/denies pain     Cognitive-Linquistic Treatment   Treatment focused on Aphasia;Cognition;Patient/family/caregiver education   Skilled Treatment PATIENT/FAMILY EDUCATION: The patient and her husband state that they do not want to continue with formal speech therapy.  The patient's husband is eager to have any information that will help him support his wife's communication status.  Reviewed the Western Aphasia Battery- Revised, subtest by subtest.  Results, interpretation, and communication tips/strategies discussed.  Reviewed results from [facility] dated 04/24/2015.  The patient demonstrates significant decline in language skills.  Her Aphasia Quotient (Western Aphasia  Battery- Revised) went from 83.5 (mild aphasia) to 61.8 (moderate aphasia).  Gave written word retrieval tips as well as games that promote word retrieval and non-verbal communication skills.     Assessment / Recommendations / Plan   Plan Continue with current plan of care;Discharge SLP treatment due to (comment);Other (Comment)  Per patient and spouse request     Progression Toward Goals   Progression toward goals Not progressing toward goals (comment)  Discharge per patient /spouse request          SLP Education - 06/03/16 0927    Education provided Yes   Education Details Review results/recommendations per testing 05/21/2016   Person(s) Educated Patient;Spouse   Methods Explanation;Demonstration   Comprehension Verbalized understanding            SLP Long Term Goals - 06/03/16 0932      SLP LONG TERM GOAL #1   Title Patient will complete 2 unit processing tasks with 80% accuracy without the need of repetition of task instructions or significant delays in responding.   Status Not Met     SLP LONG TERM GOAL #2   Title Patient will complete semantic feature word finding tasks with 80% accuracy.   Status Not Met     SLP LONG TERM GOAL #3   Title Patient will demonstrate reading comprehension for sentences with 80% accuracy.    Status Not Met          Plan - 06/03/16 0929    Clinical Impression Statement Documentation shows that the patient  has experienced significant decline in language skills over the past year.  The patient and her husband agree that their communication is very functional; also agreeing that this due to their shared knowledge.  They both want to maintain their communication effectiveness, but also have ideas / strategies for family and friends to implement with her.  The patient and her husband state today that they do not want to continue with formal speech therapy.  The patient's husband is eager to have any information that will help him support his  wife's communication status.  Reviewed the Western Aphasia Battery- Revised, subtest by subtest.  Results, interpretation, and communication tips/strategies discussed.  Reviewed results from [facility] dated 04/24/2015.  The patient demonstrates significant decline in language skills.  Her Aphasia Quotient (Western Aphasia Battery- Revised) went from 83.5 (mild aphasia) to 61.8 (moderate aphasia).  Gave written word retrieval tips as well as games that promote word retrieval and non-verbal communication skills.   Speech Therapy Frequency Other (comment)  Discharge speech therapy   Duration Other (comment)  D/C   Treatment/Interventions Patient/family education;Compensatory strategies;Multimodal communcation approach;Internal/external aids;Functional tasks   Potential to Achieve Goals Good   Potential Considerations Ability to learn/carryover information;Co-morbidities;Cooperation/participation level;Previous level of function;Severity of impairments;Family/community support   SLP Home Exercise Plan word retrieval strategies, games to promote verbal and non-verbal communication   Consulted and Agree with Plan of Care Patient;Family member/caregiver   Family Member Consulted Spouse      Patient will benefit from skilled therapeutic intervention in order to improve the following deficits and impairments:   Aphasia  Cognitive communication deficit      G-Codes - June 23, 2016 0932    Functional Assessment Tool Used Western Aphasia Battery- Revised, clinical judgment   Functional Limitations Spoken language expressive   Spoken Language Expression Current Status [phone]) At least 40 percent but less than 60 percent impaired, limited or restricted   Spoken Language Expression Goal Status (U0454) At least 40 percent but less than 60 percent impaired, limited or restricted   Spoken Language Expression Discharge Status [phone]) At least 40 percent but less than 60 percent impaired, limited or restricted       Problem List There are no active problems to display for this patient.  Leroy Sea, MS/CCC- SLP  Lou Miner 2016-06-23, 9:34 AM  [place] MAIN [facility] SERVICES [address] [place], [place], [address] Phone: [phone]   Fax:  [phone]   Name: Sydney Turner MRN: [id number] Date of Birth: April 20, 1941

## 2016-06-05 ENCOUNTER — Encounter: Payer: 59 | Admitting: Speech Pathology

## 2016-06-10 ENCOUNTER — Encounter: Payer: Medicare Other | Admitting: Speech Pathology

## 2016-06-12 ENCOUNTER — Encounter: Payer: 59 | Admitting: Speech Pathology

## 2016-06-15 ENCOUNTER — Encounter: Payer: Medicare Other | Admitting: Speech Pathology

## 2016-06-16 ENCOUNTER — Encounter: Payer: 59 | Admitting: Speech Pathology

## 2016-06-17 ENCOUNTER — Encounter: Payer: Medicare Other | Admitting: Speech Pathology

## 2016-06-19 ENCOUNTER — Encounter: Payer: 59 | Admitting: Speech Pathology

## 2016-06-23 ENCOUNTER — Encounter: Payer: Medicare Other | Admitting: Speech Pathology

## 2016-06-25 ENCOUNTER — Encounter: Payer: Medicare Other | Admitting: Speech Pathology

## 2017-02-02 ENCOUNTER — Encounter: Payer: Self-pay | Admitting: Emergency Medicine

## 2017-02-02 ENCOUNTER — Inpatient Hospital Stay
Admission: EM | Admit: 2017-02-02 | Discharge: 2017-02-03 | DRG: 872 | Disposition: A | Discharge status: Home / Self Care | Payer: Medicare Other | Attending: Internal Medicine | Admitting: Internal Medicine

## 2017-02-02 ENCOUNTER — Emergency Department: Payer: Medicare Other

## 2017-02-02 DIAGNOSIS — R4182 Altered mental status, unspecified: Secondary | ICD-10-CM

## 2017-02-02 DIAGNOSIS — Z880 Allergy status to penicillin: Secondary | ICD-10-CM

## 2017-02-02 DIAGNOSIS — R4701 Aphasia: Secondary | ICD-10-CM | POA: Diagnosis present

## 2017-02-02 DIAGNOSIS — E039 Hypothyroidism, unspecified: Secondary | ICD-10-CM | POA: Diagnosis not present

## 2017-02-02 DIAGNOSIS — Z882 Allergy status to sulfonamides status: Secondary | ICD-10-CM | POA: Diagnosis not present

## 2017-02-02 DIAGNOSIS — Z79899 Other long term (current) drug therapy: Secondary | ICD-10-CM

## 2017-02-02 DIAGNOSIS — M810 Age-related osteoporosis without current pathological fracture: Secondary | ICD-10-CM | POA: Diagnosis not present

## 2017-02-02 DIAGNOSIS — R6510 Systemic inflammatory response syndrome (SIRS) of non-infectious origin without acute organ dysfunction: Secondary | ICD-10-CM

## 2017-02-02 DIAGNOSIS — A419 Sepsis, unspecified organism: Principal | ICD-10-CM | POA: Diagnosis present

## 2017-02-02 DIAGNOSIS — Z881 Allergy status to other antibiotic agents status: Secondary | ICD-10-CM | POA: Diagnosis not present

## 2017-02-02 DIAGNOSIS — E871 Hypo-osmolality and hyponatremia: Secondary | ICD-10-CM | POA: Diagnosis present

## 2017-02-02 DIAGNOSIS — F0280 Dementia in other diseases classified elsewhere without behavioral disturbance: Secondary | ICD-10-CM | POA: Diagnosis present

## 2017-02-02 DIAGNOSIS — D72829 Elevated white blood cell count, unspecified: Secondary | ICD-10-CM

## 2017-02-02 DIAGNOSIS — G309 Alzheimer's disease, unspecified: Secondary | ICD-10-CM | POA: Diagnosis not present

## 2017-02-02 DIAGNOSIS — N390 Urinary tract infection, site not specified: Secondary | ICD-10-CM | POA: Diagnosis not present

## 2017-02-02 LAB — CBC WITH DIFFERENTIAL/PLATELET
BASOS ABS: 0 10*3/uL (ref 0–0.1)
BASOS PCT: 0 %
EOS ABS: 0.1 10*3/uL (ref 0–0.7)
Eosinophils Relative: 0 %
HEMATOCRIT: 36.3 % (ref 35.0–47.0)
HEMOGLOBIN: 12.2 g/dL (ref 12.0–16.0)
Lymphocytes Relative: 2 %
Lymphs Abs: 0.2 10*3/uL — ABNORMAL LOW (ref 1.0–3.6)
MCH: 31.5 pg (ref 26.0–34.0)
MCHC: 33.7 g/dL (ref 32.0–36.0)
MCV: 93.7 fL (ref 80.0–100.0)
Monocytes Absolute: 0.5 10*3/uL (ref 0.2–0.9)
Monocytes Relative: 4 %
NEUTROS ABS: 12.7 10*3/uL — AB (ref 1.4–6.5)
NEUTROS PCT: 94 %
Platelets: 212 10*3/uL (ref 150–440)
RBC: 3.87 MIL/uL (ref 3.80–5.20)
RDW: 13.7 % (ref 11.5–14.5)
WBC: 13.5 10*3/uL — AB (ref 3.6–11.0)

## 2017-02-02 LAB — URINALYSIS, COMPLETE (UACMP) WITH MICROSCOPIC
BILIRUBIN URINE: NEGATIVE
Bacteria, UA: NONE SEEN
GLUCOSE, UA: NEGATIVE mg/dL
Ketones, ur: NEGATIVE mg/dL
LEUKOCYTES UA: NEGATIVE
NITRITE: NEGATIVE
PROTEIN: NEGATIVE mg/dL
Specific Gravity, Urine: 1.013 (ref 1.005–1.030)
Squamous Epithelial / LPF: NONE SEEN
pH: 7 (ref 5.0–8.0)

## 2017-02-02 LAB — COMPREHENSIVE METABOLIC PANEL
ALK PHOS: 49 U/L (ref 38–126)
ALT: 14 U/L (ref 14–54)
ANION GAP: 9 (ref 5–15)
AST: 27 U/L (ref 15–41)
Albumin: 3.7 g/dL (ref 3.5–5.0)
BUN: 18 mg/dL (ref 6–20)
CALCIUM: 9.5 mg/dL (ref 8.9–10.3)
CO2: 26 mmol/L (ref 22–32)
CREATININE: 0.86 mg/dL (ref 0.44–1.00)
Chloride: 97 mmol/L — ABNORMAL LOW (ref 101–111)
Glucose, Bld: 111 mg/dL — ABNORMAL HIGH (ref 65–99)
Potassium: 4.1 mmol/L (ref 3.5–5.1)
SODIUM: 132 mmol/L — AB (ref 135–145)
Total Bilirubin: 1.3 mg/dL — ABNORMAL HIGH (ref 0.3–1.2)
Total Protein: 7 g/dL (ref 6.5–8.1)

## 2017-02-02 LAB — INFLUENZA PANEL BY PCR (TYPE A & B)
INFLBPCR: NEGATIVE
Influenza A By PCR: NEGATIVE

## 2017-02-02 LAB — LACTIC ACID, PLASMA: Lactic Acid, Venous: 1.8 mmol/L (ref 0.5–1.9)

## 2017-02-02 LAB — TROPONIN I: Troponin I: <0.03 ng/mL (ref <0.03)

## 2017-02-02 MED ORDER — LEVOFLOXACIN IN D5W 750 MG/150ML IV SOLN
750.0000 mg | Freq: Once | INTRAVENOUS | Status: AC
  Administered 2017-02-02: 750 mg via INTRAVENOUS
  Filled 2017-02-02: qty 150

## 2017-02-02 MED ORDER — LORAZEPAM 2 MG/ML IJ SOLN
1.0000 mg | Freq: Once | INTRAMUSCULAR | Status: AC
  Administered 2017-02-02: 1 mg via INTRAVENOUS
  Filled 2017-02-02: qty 1

## 2017-02-02 MED ORDER — DEXTROSE 5 % IV SOLN
2.0000 g | Freq: Once | INTRAVENOUS | Status: AC
  Administered 2017-02-02: 2 g via INTRAVENOUS
  Filled 2017-02-02: qty 2

## 2017-02-02 MED ORDER — VANCOMYCIN HCL IN DEXTROSE 1-5 GM/200ML-% IV SOLN
1000.0000 mg | Freq: Once | INTRAVENOUS | Status: AC
  Administered 2017-02-03: 1000 mg via INTRAVENOUS
  Filled 2017-02-02: qty 200

## 2017-02-02 MED ORDER — SODIUM CHLORIDE 0.9 % IV BOLUS (SEPSIS)
1000.0000 mL | Freq: Once | INTRAVENOUS | Status: AC
  Administered 2017-02-02: 1000 mL via INTRAVENOUS

## 2017-02-02 NOTE — H&P (Signed)
History and Physical   [facility] [place] [facility] Admission History and Physical [employer], D.O.    Patient Name: Sydney Turner MR#: [id number] Date of Birth: 05-30-1941 Date of Admission: 02/02/2017  Referring MD/NP/PA: Dr. Derrill Kay Primary Care Physician: Katharine Look, MD Patient coming from: Independent living   Chief Complaint:  Chief Complaint  Patient presents with  . Altered Mental Status  Please note the entire history is obtained from the patient's emergency department chart, emergency department provider and the patient's family who is at the bedside. Patient's personal history is limited by dementia and altered mental status.   HPI: Sydney Turner is a 76 y.o. female with a known history of Alzheimer's dementia, osteoporosis, osteoarthritis, hypothyroidism presents to the emergency department for evaluation of altered mental status.  Patient was in a usual state of health until the past few weeks she has been treated with Macrobid and Cipro for frequent urinary tract infections. She was brought to the emergency department today with a complaint of altered mental status, increasing confusion and agitation..  EMS/ED Course: Patient received aztreonam, Levaquin and vancomycin  Review of Systems:  Unable to obtain secondary to altered mental status and dementia   Past Medical History:  Diagnosis Date  . Alzheimer's dementia    With Primary Progressive Aphasia  Alzheimer's dementia, osteoporosis, osteoarthritis, hypothyroidism   Surgical history significant for cataract extractions, breast biopsies   reports that she has never smoked. She has never used smokeless tobacco. Her alcohol and drug histories are not on file.  Allergies  Allergen Reactions  . Amoxicillin   . Penicillins   . Sulfa Antibiotics    Family History   Medical History Relation Name Comments  Cancer Father    Stroke Father    Breast cancer Mother    Cancer  Mother    Colon cancer Mother       Prior to Admission medications   Medication Sig Start Date End Date Taking? Authorizing Provider  levothyroxine (SYNTHROID, LEVOTHROID) 112 MCG tablet Take 112 mcg by mouth daily before breakfast.   Yes Historical Provider, MD  traZODone (DESYREL) 50 MG tablet Take 1 tablet by mouth as needed. 01/05/17  Yes Historical Provider, MD    Physical Exam: Vitals:   02/02/17 2109 02/02/17 2145 02/02/17 2215 02/02/17 2300  BP:    109/60  Pulse:  88 91 86  Resp:  (!) 26 (!) 23 (!) 21  Temp:      TempSrc:      SpO2:  96% 100% 95%  Weight: 63.6 kg (140 lb 3.2 oz)       GENERAL: 76 y.o.-year-old female patient, well-developed, well-nourished lying in the bed in no acute distress. Arousable but confused, mumbling.   HEENT: Head atraumatic, normocephalic. Pupils equal, round, reactive to light and accommodation. No scleral icterus. Extraocular muscles intact. Nares are patent. Oropharynx is clear. Mucus membranes moist. NECK: Supple, full range of motion. No JVD. CHEST: Normal breath sounds bilaterally. No wheezing, rales, rhonchi or crackles. No use of accessory muscles of respiration.  No reproducible chest wall tenderness.  CARDIOVASCULAR: S1, S2 normal. No murmurs, rubs, or gallops. Cap refill <2 seconds. Pulses intact distally.  ABDOMEN: Soft, nondistended.  Mild suprapubic tenderness to palpation. No rebound, guarding, rigidity. Normoactive bowel sounds present in all four quadrants. No organomegaly or mass. EXTREMITIES: No pedal edema, cyanosis, or clubbing. No calf tenderness or Homan's sign.  NEUROLOGIC: The patient is arousable but not alert, cannot follow commands.  PSYCHIATRIC:  Normal affect, mood, thought content. SKIN: Warm, dry, and intact without obvious rash, lesion, or ulcer.    Labs on Admission:  CBC:  Recent Labs Lab 02/02/17 2109  WBC 13.5*  NEUTROABS 12.7*  HGB 12.2  HCT 36.3  MCV 93.7  PLT 212   Basic Metabolic  Panel:  Recent Labs Lab 02/02/17 2109  NA 132*  K 4.1  CL 97*  CO2 26  GLUCOSE 111*  BUN 18  CREATININE 0.86  CALCIUM 9.5   GFR: CrCl cannot be calculated (Unknown ideal weight.). Liver Function Tests:  Recent Labs Lab 02/02/17 2109  AST 27  ALT 14  ALKPHOS 49  BILITOT 1.3*  PROT 7.0  ALBUMIN 3.7   No results for input(s): LIPASE, AMYLASE in the last 168 hours. No results for input(s): AMMONIA in the last 168 hours. Coagulation Profile: No results for input(s): INR, PROTIME in the last 168 hours. Cardiac Enzymes:  Recent Labs Lab 02/02/17 2109  TROPONINI <0.03   BNP (last 3 results) No results for input(s): PROBNP in the last 8760 hours. HbA1C: No results for input(s): HGBA1C in the last 72 hours. CBG: No results for input(s): GLUCAP in the last 168 hours. Lipid Profile: No results for input(s): CHOL, HDL, LDLCALC, TRIG, CHOLHDL, LDLDIRECT in the last 72 hours. Thyroid Function Tests: No results for input(s): TSH, T4TOTAL, FREET4, T3FREE, THYROIDAB in the last 72 hours. Anemia Panel: No results for input(s): VITAMINB12, FOLATE, FERRITIN, TIBC, IRON, RETICCTPCT in the last 72 hours. Urine analysis:    Component Value Date/Time   COLORURINE YELLOW (A) 02/02/2017 2109   APPEARANCEUR CLEAR (A) 02/02/2017 2109   APPEARANCEUR CLEAR 03/23/2014 1953   LABSPEC 1.013 02/02/2017 2109   LABSPEC 1.020 03/23/2014 1953   PHURINE 7.0 02/02/2017 2109   GLUCOSEU NEGATIVE 02/02/2017 2109   GLUCOSEU NEGATIVE 03/23/2014 1953   HGBUR MODERATE (A) 02/02/2017 2109   BILIRUBINUR NEGATIVE 02/02/2017 2109   BILIRUBINUR NEGATIVE 03/23/2014 1953   KETONESUR NEGATIVE 02/02/2017 2109   PROTEINUR NEGATIVE 02/02/2017 2109   NITRITE NEGATIVE 02/02/2017 2109   LEUKOCYTESUR NEGATIVE 02/02/2017 2109   LEUKOCYTESUR TRACE 03/23/2014 1953   Sepsis Labs: (procalcitonin:4,lacticidven:4) )No results found for this or any previous visit (from the past 240 hour(s)).    Radiological Exams on Admission: Dg Chest Port 1 View  Result Date: 02/02/2017 CLINICAL DATA:  76 y/o  F; weakness and altered mental status. EXAM: PORTABLE CHEST 1 VIEW COMPARISON:  07/28/2012 chest radiograph. FINDINGS: Stable normal cardiac silhouette. Aortic atherosclerosis with calcification. Clear lungs. No pleural effusion or pneumothorax. No acute osseous abnormality is evident. IMPRESSION: No active disease. Electronically Signed   By: Mitzi Hansen M.D.   On: 02/02/2017 21:55    EKG: Normal sinus rhythm at 94 bpm with normal axis and nonspecific ST-T wave changes.   Assessment/Plan  This is a 76 y.o. female with a history of Alzheimer's dementia, osteoporosis, osteoarthritis, hypothyroidism  now being admitted with:  #. Sepsis secondary to UTI - Admit to inpatient with telemetry monitoring - IV antibiotics: Azactam, Levaquin, Vanco - IV fluid hydration - Follow up blood,urine & sputum cultures - Repeat CBC in am.   #. Hyponatremia, mild - IV fluids and repeat BMP in AM  #. History of hypothyroidism - Continue levothyroxine  Admission status: Inpatient IV Fluids: NS Diet/Nutrition: Regular Consults called: None  DVT Px: Lovenox, SCDs and early ambulation. Code Status: Full Code  Disposition Plan: To home in 1-2 days  All the records are reviewed and  case discussed with ED provider. Management plans discussed with the patient and/or family who express understanding and agree with plan of care.  Sonya Pucci D.O. on 02/02/2017 at 11:55 PM Between 7am to 6pm - Pager - [phone] After 6pm go to [email or web address] - Social research officer, government [facility] [place] [facility] Office [phone] CC: Primary care physician; Katharine Look, MD   02/02/2017, 11:55 PM

## 2017-02-02 NOTE — ED Provider Notes (Signed)
[facility] Emergency Department Provider Note   ____________________________________________   I have reviewed the triage vital signs and the nursing notes.   HISTORY  Chief Complaint Altered Mental Status   History limited by: dementia, AMS   HPI Sydney Turner is a 76 y.o. female who presents to the emergency department today because of concerns for altered mental status. Patient has a history of dementia and is unable to give any history. History is obtained from daughters. The patient has had frequent UTIs in the past few weeks. She has been treated with both macrobid and cipro. Family states that he behavior has been more confused and erratic.    Past Medical History:  Diagnosis Date  . Alzheimer's dementia    With Primary Progressive Aphasia    There are no active problems to display for this patient.   No past surgical history on file.  Prior to Admission medications   Medication Sig Start Date End Date Taking? Authorizing Provider  levothyroxine (SYNTHROID, LEVOTHROID) 112 MCG tablet Take 112 mcg by mouth daily before breakfast.    Historical Provider, MD    Allergies Amoxicillin; Penicillins; and Sulfa antibiotics  No family history on file.  Social History Social History  Substance Use Topics  . Smoking status: Never Smoker  . Smokeless tobacco: Never Used  . Alcohol use Not on file    Review of Systems Unable to obtain secondary to dementia and AMS.  ____________________________________________   PHYSICAL EXAM:  VITAL SIGNS: ED Triage Vitals  Enc Vitals Group     BP 02/02/17 2108 118/73     Pulse Rate 02/02/17 2108 93     Resp --      Temp 02/02/17 2108 (!) 101.4 F (38.6 C)     Temp Source 02/02/17 2108 Rectal     SpO2 02/02/17 2108 96 %     Weight 02/02/17 2109 140 lb 3.2 oz (63.6 kg)     Height --     Constitutional: Awake and alert. Not oriented to events.  Eyes: Conjunctivae are normal. Normal  extraocular movements. ENT   Head: Normocephalic and atraumatic.   Nose: No congestion/rhinnorhea.   Mouth/Throat: Mucous membranes are moist.   Neck: No stridor. Hematological/Lymphatic/Immunilogical: No cervical lymphadenopathy. Cardiovascular: Normal rate, regular rhythm.  No murmurs, rubs, or gallops.  Respiratory: Normal respiratory effort without tachypnea nor retractions. Breath sounds are clear and equal bilaterally. No wheezes/rales/rhonchi. Gastrointestinal: Soft and non tender. No rebound. No guarding.  Genitourinary: Deferred Musculoskeletal: Normal range of motion in all extremities. No lower extremity edema. Neurologic:  Awake and alert. Demented. Not oriented to events. Appears to move all extremities.  Skin:  Skin is warm, dry and intact. No rash noted.  ____________________________________________    LABS (pertinent positives/negatives)  WBC 13.5 Lactic 1.8 Cr .86   ____________________________________________   EKG  I, Phineas Semen, attending physician, personally viewed and interpreted this EKG  EKG Time: 2116 Rate: 94 Rhythm: sinus rhythm Axis: normal Intervals: qtc 416 QRS: narrow, RSR' in V1 ST changes: no st elevation Impression: abnormal ekg   ____________________________________________    RADIOLOGY  CXR IMPRESSION:  No active disease.   ____________________________________________   PROCEDURES  Procedures  CRITICAL CARE Performed by: Phineas Semen   Total critical care time: 30 minutes  Critical care time was exclusive of separately billable procedures and treating other patients.  Critical care was necessary to treat or prevent imminent or life-threatening deterioration.  Critical care was time spent personally by me  on the following activities: development of treatment plan with patient and/or surrogate as well as nursing, discussions with consultants, evaluation of patient's response to treatment,  examination of patient, obtaining history from patient or surrogate, ordering and performing treatments and interventions, ordering and review of laboratory studies, ordering and review of radiographic studies, pulse oximetry and re-evaluation of patient's condition.  ____________________________________________   INITIAL IMPRESSION / ASSESSMENT AND PLAN / ED COURSE  Pertinent labs & imaging results that were available during my care of the patient were reviewed by me and considered in my medical decision making (see chart for details).  Presented to the emergency department if he has concerns for continuing urinary tract infection. Patient is unable to give any history and history is obtained from daughters. Patient was febrile and heart rate was over 90. She was called a code sepsis. Patient was given multiple broad spectrum antibiotics. Leukocytosis was present. Patient was admitted to the hospitalist service.   ____________________________________________   FINAL CLINICAL IMPRESSION(S) / ED DIAGNOSES  Final diagnoses:  Altered mental status, unspecified altered mental status type  Leukocytosis, unspecified type  SIRS (systemic inflammatory response syndrome) (HCC)     Note: This dictation was prepared with Dragon dictation. Any transcriptional errors that result from this process are unintentional     Phineas Semen, MD 02/03/17 1510

## 2017-02-02 NOTE — ED Triage Notes (Signed)
Pt arrived to ED per EMS from independent living facility for AMS. EMS reports 4 UTI's in last month, treated with Microbid and cipro. Family concerned due to AMS, worried for worsening infection due to UTI's. HX of dementia.

## 2017-02-03 ENCOUNTER — Inpatient Hospital Stay: Payer: Medicare Other

## 2017-02-03 DIAGNOSIS — Z79899 Other long term (current) drug therapy: Secondary | ICD-10-CM | POA: Diagnosis not present

## 2017-02-03 DIAGNOSIS — N390 Urinary tract infection, site not specified: Secondary | ICD-10-CM

## 2017-02-03 DIAGNOSIS — E871 Hypo-osmolality and hyponatremia: Secondary | ICD-10-CM | POA: Diagnosis not present

## 2017-02-03 DIAGNOSIS — R6510 Systemic inflammatory response syndrome (SIRS) of non-infectious origin without acute organ dysfunction: Secondary | ICD-10-CM | POA: Diagnosis present

## 2017-02-03 DIAGNOSIS — G309 Alzheimer's disease, unspecified: Secondary | ICD-10-CM | POA: Diagnosis not present

## 2017-02-03 DIAGNOSIS — M810 Age-related osteoporosis without current pathological fracture: Secondary | ICD-10-CM | POA: Diagnosis not present

## 2017-02-03 DIAGNOSIS — Z882 Allergy status to sulfonamides status: Secondary | ICD-10-CM | POA: Diagnosis not present

## 2017-02-03 DIAGNOSIS — E039 Hypothyroidism, unspecified: Secondary | ICD-10-CM | POA: Diagnosis not present

## 2017-02-03 DIAGNOSIS — R4701 Aphasia: Secondary | ICD-10-CM | POA: Diagnosis not present

## 2017-02-03 DIAGNOSIS — A419 Sepsis, unspecified organism: Secondary | ICD-10-CM | POA: Diagnosis present

## 2017-02-03 DIAGNOSIS — F0280 Dementia in other diseases classified elsewhere without behavioral disturbance: Secondary | ICD-10-CM | POA: Diagnosis not present

## 2017-02-03 DIAGNOSIS — Z880 Allergy status to penicillin: Secondary | ICD-10-CM | POA: Diagnosis not present

## 2017-02-03 DIAGNOSIS — Z881 Allergy status to other antibiotic agents status: Secondary | ICD-10-CM | POA: Diagnosis not present

## 2017-02-03 LAB — BASIC METABOLIC PANEL
Anion gap: 5 (ref 5–15)
BUN: 19 mg/dL (ref 6–20)
CALCIUM: 8.6 mg/dL — AB (ref 8.9–10.3)
CO2: 26 mmol/L (ref 22–32)
CREATININE: 0.76 mg/dL (ref 0.44–1.00)
Chloride: 100 mmol/L — ABNORMAL LOW (ref 101–111)
GFR calc Af Amer: >60 mL/min (ref >60)
GFR calc non Af Amer: >60 mL/min (ref >60)
GLUCOSE: 94 mg/dL (ref 65–99)
Potassium: 3.8 mmol/L (ref 3.5–5.1)
Sodium: 131 mmol/L — ABNORMAL LOW (ref 135–145)

## 2017-02-03 LAB — CBC
HCT: 30.2 % — ABNORMAL LOW (ref 35.0–47.0)
Hemoglobin: 10.4 g/dL — ABNORMAL LOW (ref 12.0–16.0)
MCH: 32.3 pg (ref 26.0–34.0)
MCHC: 34.3 g/dL (ref 32.0–36.0)
MCV: 94 fL (ref 80.0–100.0)
PLATELETS: 164 10*3/uL (ref 150–440)
RBC: 3.21 MIL/uL — ABNORMAL LOW (ref 3.80–5.20)
RDW: 13.7 % (ref 11.5–14.5)
WBC: 9.8 10*3/uL (ref 3.6–11.0)

## 2017-02-03 LAB — APTT: aPTT: 40 seconds — ABNORMAL HIGH (ref 24–36)

## 2017-02-03 LAB — LACTIC ACID, PLASMA: LACTIC ACID, VENOUS: 1 mmol/L (ref 0.5–1.9)

## 2017-02-03 LAB — PROCALCITONIN: PROCALCITONIN: 0.37 ng/mL

## 2017-02-03 LAB — PROTIME-INR
INR: 1.29
Prothrombin Time: 16.2 seconds — ABNORMAL HIGH (ref 11.4–15.2)

## 2017-02-03 MED ORDER — BISACODYL 5 MG PO TBEC: 5.0000 mg | DELAYED_RELEASE_TABLET | Freq: Every day | ORAL | Status: DC | PRN

## 2017-02-03 MED ORDER — DEXTROSE 5 % IV SOLN
2.0000 g | Freq: Three times a day (TID) | INTRAVENOUS | Status: DC
  Administered 2017-02-03: 2 g via INTRAVENOUS
  Filled 2017-02-03 (×3): qty 2

## 2017-02-03 MED ORDER — TRAZODONE HCL 50 MG PO TABS
50.0000 mg | ORAL_TABLET | ORAL | Status: DC | PRN
Start: 2017-02-03 — End: 2017-02-03
  Administered 2017-02-03: 50 mg via ORAL
  Filled 2017-02-03: qty 1

## 2017-02-03 MED ORDER — ALBUTEROL SULFATE (2.5 MG/3ML) 0.083% IN NEBU
2.5000 mg | INHALATION_SOLUTION | Freq: Four times a day (QID) | RESPIRATORY_TRACT | Status: DC | PRN
Start: 2017-02-03 — End: 2017-02-03

## 2017-02-03 MED ORDER — ONDANSETRON HCL 4 MG PO TABS: 4.0000 mg | ORAL_TABLET | Freq: Four times a day (QID) | ORAL | Status: DC | PRN

## 2017-02-03 MED ORDER — SENNOSIDES-DOCUSATE SODIUM 8.6-50 MG PO TABS: 1.0000 | ORAL_TABLET | Freq: Every evening | ORAL | Status: DC | PRN

## 2017-02-03 MED ORDER — ACETAMINOPHEN 650 MG RE SUPP: 650.0000 mg | Freq: Four times a day (QID) | RECTAL | Status: DC | PRN

## 2017-02-03 MED ORDER — ENOXAPARIN SODIUM 40 MG/0.4ML ~~LOC~~ SOLN: 40.0000 mg | SUBCUTANEOUS | Status: DC

## 2017-02-03 MED ORDER — LEVOTHYROXINE SODIUM 112 MCG PO TABS
112.0000 ug | ORAL_TABLET | Freq: Every day | ORAL | Status: DC
  Administered 2017-02-03: 112 ug via ORAL
  Filled 2017-02-03: qty 1

## 2017-02-03 MED ORDER — LEVOFLOXACIN IN D5W 750 MG/150ML IV SOLN: 750.0000 mg | Freq: Once | INTRAVENOUS | Status: DC

## 2017-02-03 MED ORDER — LEVOFLOXACIN IN D5W 750 MG/150ML IV SOLN: 750.0000 mg | INTRAVENOUS | Status: DC

## 2017-02-03 MED ORDER — OXYCODONE HCL 5 MG PO TABS
5.0000 mg | ORAL_TABLET | ORAL | Status: DC | PRN
  Administered 2017-02-03: 5 mg via ORAL
  Filled 2017-02-03: qty 1

## 2017-02-03 MED ORDER — MAGNESIUM CITRATE PO SOLN
1.0000 | Freq: Once | ORAL | Status: DC | PRN
  Filled 2017-02-03: qty 296

## 2017-02-03 MED ORDER — ONDANSETRON HCL 4 MG/2ML IJ SOLN: 4.0000 mg | Freq: Four times a day (QID) | INTRAMUSCULAR | Status: DC | PRN

## 2017-02-03 MED ORDER — ACETAMINOPHEN 325 MG PO TABS: 650.0000 mg | ORAL_TABLET | Freq: Four times a day (QID) | ORAL | Status: DC | PRN

## 2017-02-03 MED ORDER — DEXTROSE 5 % IV SOLN: 2.0000 g | Freq: Once | INTRAVENOUS | Status: DC

## 2017-02-03 MED ORDER — VANCOMYCIN HCL IN DEXTROSE 1-5 GM/200ML-% IV SOLN
1000.0000 mg | INTRAVENOUS | Status: DC
  Filled 2017-02-03: qty 200

## 2017-02-03 MED ORDER — LEVOFLOXACIN IN D5W 750 MG/150ML IV SOLN
750.0000 mg | INTRAVENOUS | Status: DC
  Filled 2017-02-03: qty 150

## 2017-02-03 MED ORDER — SODIUM CHLORIDE 0.9 % IV SOLN
INTRAVENOUS | Status: DC
  Administered 2017-02-03: 04:00:00 via INTRAVENOUS

## 2017-02-03 MED ORDER — LEVOFLOXACIN 25 MG/ML PO SOLN: 750.0000 mg | Freq: Every day | ORAL | 0 refills | Status: AC

## 2017-02-03 MED ORDER — IPRATROPIUM BROMIDE 0.02 % IN SOLN: 0.5000 mg | Freq: Four times a day (QID) | RESPIRATORY_TRACT | Status: DC | PRN

## 2017-02-03 MED ORDER — RISPERIDONE 0.5 MG PO TBDP
0.5000 mg | ORAL_TABLET | Freq: Three times a day (TID) | ORAL | Status: DC | PRN
  Administered 2017-02-03: 0.5 mg via ORAL
  Filled 2017-02-03: qty 1

## 2017-02-03 NOTE — Progress Notes (Signed)
PT Cancellation Note  Patient Details Name: Sydney Turner MRN: [id number] DOB: [birth year]   Cancelled Treatment:    Reason Eval/Treat Not Completed: Fatigue/lethargy limiting ability to participate (Consult received and chart reviewed.  Patient agitated this AM, received medications to calm patient.  Currently sleeping soundly; caregiver/family at bedside requests therapist not wake patient at this time.  will re-attempt at later time/date as medically appropriate.)  Emmette Katt H. Manson Passey, PT, DPT, NCS 02/03/17, 11:43 AM [phone]

## 2017-02-03 NOTE — Progress Notes (Addendum)
Pharmacy Antibiotic Note  Sydney Turner is a 76 y.o. female admitted on 02/02/2017 with sepsis.  Pharmacy has been consulted for vancomycin, Levaquin, and aztreonam dosing.  Plan: DW 64kg  Vd 45L kei 0.046 hr-1  t1/2 15 hours Vancomycin 1 gram q 18 hours ordered with stacked dosing. Level before 5th dose. Goal trough 15-20.  Levaquin 750 mg q 48 hours ordered.  Aztreonam 2 grams q 8 hours ordered.  4/18 0357 SCr decreased slightly, CrCl estimated >50 mL/min. Levofloxacin dose modified to Q24H - other abx remain unchanged.  Imanii Gosdin A. [place], [place].D., BCPS  Height:  (157.5 cm) Weight: 140 lb 3.2 oz (63.6 kg) IBW/kg (Calculated) : 50.1  Temp (24hrs), Avg:101.4 F (38.6 C), Min:101.4 F (38.6 C), Max:101.4 F (38.6 C)   Recent Labs Lab 02/02/17 2109 02/02/17 2124  WBC 13.5*  --   CREATININE 0.86  --   LATICACIDVEN  --  1.8    Estimated Creatinine Clearance: 49.5 mL/min (by C-G formula based on SCr of 0.86 mg/dL).    Allergies  Allergen Reactions  . Amoxicillin   . Penicillins   . Sulfa Antibiotics     Antimicrobials this admission: Vancomycin, Levaquin, aztreonam 4/17  >>    >>   Dose adjustments this admission:   Microbiology results: 4/17 BCx: pending 4/17 UCx: pending      4/17 CXR: no active disease 4/17 UA: (-) Thank you for allowing pharmacy to be a part of this patient's care.  McBane,Matthew S 02/03/2017 1:10 AM

## 2017-02-03 NOTE — Progress Notes (Signed)
Patient being discharged home today. PIV removed. Discharge instructions reviewed with pt and family, all questions answered. Follow up appointment is scheduled, prescriptions sent to her pharmacy for pickup. She is leaving with all of her belongings, will be transported home via family.

## 2017-02-03 NOTE — Discharge Summary (Signed)
[facility] - [place] at [facility]   PATIENT NAME: Sydney Turner    MR#:  [id number]  DATE OF BIRTH:  24-Jan-1941  DATE OF ADMISSION:  02/02/2017 ADMITTING PHYSICIAN: Tonye Royalty, DO  DATE OF DISCHARGE: 02/03/2017  PRIMARY CARE PHYSICIAN: Katharine Look, MD    ADMISSION DIAGNOSIS:  SIRS (systemic inflammatory response syndrome) (HCC) [R65.10] Sepsis (HCC) [A41.9] Altered mental status, unspecified altered mental status type [R41.82] Leukocytosis, unspecified type [D72.829]  DISCHARGE DIAGNOSIS:  Active Problems:   Sepsis secondary to UTI (HCC)   SECONDARY DIAGNOSIS:   Past Medical History:  Diagnosis Date  . Alzheimer's dementia    With Primary Progressive Aphasia    HOSPITAL COURSE:   76 y.o.femalewith a history of Alzheimer's dementia, osteoporosis, osteoarthritis, hypothyroidism admitted with sepsis.  1. Sepsis: Patient presents with elevated WBC, tachypnea and fever. Source unclear as UA negative for UTI (has RBC) and CXR negative. However family is reporting patient has had a cough for a few days. She may have PNA not yet seen on XRAY, however they do not want to repeat XRAY as they felt it was traumatic to the patient. Continue Levaquin (ALLERGY to PCN). 2. Hyponatremia: From poor po intake 3. Hypothyroid: Continue Synthroid  4. Alzheimer's dementia: family wanted to take her home since she was becoming agitated in the hospital  DISCHARGE CONDITIONS AND DIET:   Stable Regular idet  CONSULTS OBTAINED:    DRUG ALLERGIES:   Allergies  Allergen Reactions  . Amoxicillin   . Penicillins   . Sulfa Antibiotics     DISCHARGE MEDICATIONS:   Current Discharge Medication List    START taking these medications   Details  levofloxacin (LEVAQUIN) 25 MG/ML solution Take 30 mLs (750 mg total) by mouth daily. Qty: 75 mL, Refills: 0      CONTINUE these medications which have NOT CHANGED   Details  levothyroxine (SYNTHROID,  LEVOTHROID) 112 MCG tablet Take 112 mcg by mouth daily before breakfast.    traZODone (DESYREL) 50 MG tablet Take 1 tablet by mouth as needed.          Today   CHIEF COMPLAINT:  Patient with dementia   VITAL SIGNS:  Blood pressure 122/68, pulse 85, temperature 97.8 F (36.6 C), temperature source Oral, resp. rate 18, height  (1.575 m), weight 64.8 kg (142 lb 12.8 oz), SpO2 98 %.   REVIEW OF SYSTEMS:  Review of Systems  Unable to perform ROS: Dementia     PHYSICAL EXAMINATION:  GENERAL:  76 y.o.-year-old patient lying in the bed with no acute distress.  NECK:  Supple, no jugular venous distention. No thyroid enlargement, no tenderness.  LUNGS: Normal breath sounds bilaterally, no wheezing, rales,rhonchi  No use of accessory muscles of respiration.  CARDIOVASCULAR: S1, S2 normal. No murmurs, rubs, or gallops.  ABDOMEN: Soft, non-tender, non-distended. Bowel sounds present. No organomegaly or mass.  EXTREMITIES: No pedal edema, cyanosis, or clubbing.  PSYCHIATRIC: The patient is alert and oriented x name  SKIN: No obvious rash, lesion, or ulcer.   DATA REVIEW:   CBC  Recent Labs Lab 02/03/17 0357  WBC 9.8  HGB 10.4*  HCT 30.2*  PLT 164    Chemistries   Recent Labs Lab 02/02/17 2109 02/03/17 0357  NA 132* 131*  K 4.1 3.8  CL 97* 100*  CO2 26 26  GLUCOSE 111* 94  BUN 18 19  CREATININE 0.86 0.76  CALCIUM 9.5 8.6*  AST 27  --   ALT 14  --  ALKPHOS 49  --   BILITOT 1.3*  --     Cardiac Enzymes  Recent Labs Lab 02/02/17 2109  TROPONINI <0.03    Microbiology Results  @  RADIOLOGY:  Dg Chest Port 1 View  Result Date: 02/02/2017 CLINICAL DATA:  76 y/o  F; weakness and altered mental status. EXAM: PORTABLE CHEST 1 VIEW COMPARISON:  07/28/2012 chest radiograph. FINDINGS: Stable normal cardiac silhouette. Aortic atherosclerosis with calcification. Clear lungs. No pleural effusion or pneumothorax. No acute osseous abnormality is  evident. IMPRESSION: No active disease. Electronically Signed   By: Mitzi Hansen M.D.   On: 02/02/2017 21:55      Current Discharge Medication List    START taking these medications   Details  levofloxacin (LEVAQUIN) 25 MG/ML solution Take 30 mLs (750 mg total) by mouth daily. Qty: 75 mL, Refills: 0      CONTINUE these medications which have NOT CHANGED   Details  levothyroxine (SYNTHROID, LEVOTHROID) 112 MCG tablet Take 112 mcg by mouth daily before breakfast.    traZODone (DESYREL) 50 MG tablet Take 1 tablet by mouth as needed.         Management plans discussed with the patient's family and they arein agreement. Stable for discharge home  Patient should follow up with pcp  CODE STATUS:     Code Status Orders        Start     Ordered   02/03/17 0324  Full code  Continuous     02/03/17 0323    Code Status History    Date Active Date Inactive Code Status Order ID Comments User Context   This patient has a current code status but no historical code status.    Advance Directive Documentation     Most Recent Value  Type of Advance Directive  Healthcare Power of Attorney  Pre-existing out of facility DNR order (yellow form or pink MOST form)  -  "MOST" Form in Place?  -      TOTAL TIME TAKING CARE OF THIS PATIENT: 37 minutes.    Note: This dictation was prepared with Dragon dictation along with smaller phrase technology. Any transcriptional errors that result from this process are unintentional.  Kuzey Ogata M.D on 02/03/2017 at 2:15 PM  Between 7am to 6pm - Pager - [phone] After 6pm go to [email or web address] - Social research officer, government  [facility]  Office  [phone]  CC: Primary care physician; Katharine Look, MD

## 2017-02-03 NOTE — Progress Notes (Signed)
[facility] - [place] at [facility]   PATIENT NAME: Sydney Turner    MR#:  [id number]  DATE OF BIRTH:  24-Nov-1940  SUBJECTIVE:   Patient with dementia Here with fevers Family at bedside Patient agitated this am  REVIEW OF SYSTEMS:    Not able to obtain due to dementia    Tolerating Diet: yes      DRUG ALLERGIES:   Allergies  Allergen Reactions  . Amoxicillin   . Penicillins   . Sulfa Antibiotics     VITALS:  Blood pressure 122/68, pulse 85, temperature 97.8 F (36.6 C), temperature source Oral, resp. rate 18, height  (1.575 m), weight 64.8 kg (142 lb 12.8 oz), SpO2 98 %.  PHYSICAL EXAMINATION:  Constitutional: Appears well-developed and well-nourished. No distress. HENT: Normocephalic. Marland Kitchen Oropharynx is clear and moist.  Eyes: Conjunctivae and EOM are normal. PERRLA, no scleral icterus.  Neck: Normal ROM. Neck supple. No JVD. No tracheal deviation. CVS: RRR, S1/S2 +, no murmurs, no gallops, no carotid bruit.  Pulmonary: Effort and breath sounds normal, no stridor, rhonchi, wheezes, rales.  Abdominal: Soft. BS +,  no distension, tenderness, rebound or guarding.  Musculoskeletal: Normal range of motion. No edema and no tenderness.  Neuro: Alert. CN 2-12 grossly intact. No focal deficits. Skin: Skin is warm and dry. No rash noted. Psychiatric:dementia confused agitated     LABORATORY PANEL:   CBC  Recent Labs Lab 02/03/17 0357  WBC 9.8  HGB 10.4*  HCT 30.2*  PLT 164   ------------------------------------------------------------------------------------------------------------------  Chemistries   Recent Labs Lab 02/02/17 2109 02/03/17 0357  NA 132* 131*  K 4.1 3.8  CL 97* 100*  CO2 26 26  GLUCOSE 111* 94  BUN 18 19  CREATININE 0.86 0.76  CALCIUM 9.5 8.6*  AST 27  --   ALT 14  --   ALKPHOS 49  --   BILITOT 1.3*  --     ------------------------------------------------------------------------------------------------------------------  Cardiac Enzymes  Recent Labs Lab 02/02/17 2109  TROPONINI <0.03   ------------------------------------------------------------------------------------------------------------------  RADIOLOGY:  Dg Chest Port 1 View  Result Date: 02/02/2017 CLINICAL DATA:  76 y/o  F; weakness and altered mental status. EXAM: PORTABLE CHEST 1 VIEW COMPARISON:  07/28/2012 chest radiograph. FINDINGS: Stable normal cardiac silhouette. Aortic atherosclerosis with calcification. Clear lungs. No pleural effusion or pneumothorax. No acute osseous abnormality is evident. IMPRESSION: No active disease. Electronically Signed   By: Mitzi Hansen M.D.   On: 02/02/2017 21:55     ASSESSMENT AND PLAN:   76 y.o. female with a history of Alzheimer's dementia, osteoporosis, osteoarthritis, hypothyroidism admitted with sepsis.  1. Sepsis: Patient presents with elevated WBC, tachypnea and fever. Source unclear as UA negative for UTI (has RBC) and CXR negative. However family is reporting patient has had a cough for a few days. She may have PNA not yet seen on XRAY, however they do not want to repeat XRAY as they felt it was traumatic to the patient. Continue Levaquin and Aztreonam now (ALLERGY to PCN). Follow up on blood cultures.  2. Hyponatremia: Due to poor by mouth intake Continue IV fluids and repeat BMP in a.m.  3. Hypothyroid: Continue Synthroid  4. Alzheimer's dementia: I will add Risperdal when necessary for education     Management plans discussed with the patient's family and they are in agreement.  CODE STATUS: FULL  TOTAL TIME TAKING CARE OF THIS PATIENT: 45 minutes.     POSSIBLE D/C tomorrow, DEPENDING ON CLINICAL CONDITION.  Samiah Ricklefs M.D on 02/03/2017 at 9:35 AM  Between 7am to 6pm - Pager - [phone] After 6pm go to [email or web address] - password [email or web address]  [facility] [place]  Office  [phone]  CC: Primary care physician; Katharine Look, MD  Note: This dictation was prepared with Dragon dictation along with smaller phrase technology. Any transcriptional errors that result from this process are unintentional.

## 2017-02-04 LAB — URINE CULTURE: CULTURE: NO GROWTH

## 2017-02-07 LAB — CULTURE, BLOOD (ROUTINE X 2)
CULTURE: NO GROWTH
Culture: NO GROWTH

## 2017-08-31 ENCOUNTER — Encounter: Payer: Self-pay | Admitting: Emergency Medicine

## 2017-08-31 ENCOUNTER — Emergency Department
Admission: EM | Admit: 2017-08-31 | Discharge: 2017-08-31 | Disposition: A | Discharge status: Home / Self Care | Payer: Medicare Other | Attending: Emergency Medicine | Admitting: Emergency Medicine

## 2017-08-31 ENCOUNTER — Emergency Department: Payer: Medicare Other

## 2017-08-31 ENCOUNTER — Other Ambulatory Visit: Payer: Self-pay

## 2017-08-31 DIAGNOSIS — W19XXXA Unspecified fall, initial encounter: Secondary | ICD-10-CM | POA: Diagnosis not present

## 2017-08-31 DIAGNOSIS — Z043 Encounter for examination and observation following other accident: Secondary | ICD-10-CM | POA: Diagnosis present

## 2017-08-31 DIAGNOSIS — F0281 Dementia in other diseases classified elsewhere with behavioral disturbance: Secondary | ICD-10-CM | POA: Insufficient documentation

## 2017-08-31 DIAGNOSIS — G308 Other Alzheimer's disease: Secondary | ICD-10-CM | POA: Insufficient documentation

## 2017-08-31 MED ORDER — ACETAMINOPHEN 325 MG PO TABS
650.0000 mg | ORAL_TABLET | Freq: Once | ORAL | Status: AC
  Administered 2017-08-31: 650 mg via ORAL
  Filled 2017-08-31: qty 2

## 2017-08-31 NOTE — ED Triage Notes (Signed)
Pt via ems from [facility] following mechanical fall. EMS states no loc; pt is at baseline, has dementia. Pt cannot tell if she is is pain, but she only gets agitated when being changed, etc. Pt states her name is Sydney Turner. NAD noted.

## 2017-08-31 NOTE — ED Provider Notes (Addendum)
[facility] Emergency Department Provider Note  ____________________________________________   I have reviewed the triage vital signs and the nursing notes.   HISTORY  Chief Complaint Fall    HPI Sydney Turner is a 76 y.o. female who presents today complaining of nothing.  She has no complaints of pain.  Patient unfortunately suffers from dementia and Alzheimer's.  She is at her baseline per EMS.  She apparently had a witnessed fall today.  Not syncopal.  No loss of consciousness no seizure.  The patient states that she has no pain.  Level 5 chart caveat; no further history available due to patient status.     Past Medical History:  Diagnosis Date  . Alzheimer's dementia    With Primary Progressive Aphasia    Patient Active Problem List   Diagnosis Date Noted  . Sepsis secondary to UTI (HCC) 02/03/2017    History reviewed. No pertinent surgical history.  Prior to Admission medications   Medication Sig Start Date End Date Taking? Authorizing Provider  levothyroxine (SYNTHROID, LEVOTHROID) 112 MCG tablet Take 112 mcg by mouth daily before breakfast.    [provider]  traZODone (DESYREL) 50 MG tablet Take 1 tablet by mouth as needed. 01/05/17   [provider]    Allergies Amoxicillin; Penicillins; and Sulfa antibiotics  History reviewed. No pertinent family history.  Social History Social History   Tobacco Use  . Smoking status: Never Smoker  . Smokeless tobacco: Never Used  Substance Use Topics  . Alcohol use: Not on file  . Drug use: Not on file    Review of Systems Level 5 chart caveat; no further history available due to patient status.   ____________________________________________   PHYSICAL EXAM:  VITAL SIGNS: ED Triage Vitals  Enc Vitals Group     BP 08/31/17 0940 (!) 143/73     Pulse Rate 08/31/17 0940 76     Resp --      Temp 08/31/17 0940 (!) 97.5 F (36.4 C)     Temp Source 08/31/17 0940 Oral      SpO2 --      Weight 08/31/17 0941 140 lb (63.5 kg)     Height 08/31/17 0941 5\' 5"  (1.651 m)     Head Circumference --      Peak Flow --      Pain Score --      Pain Loc --      Pain Edu? --      Excl. in GC? --     Constitutional: Alert and oriented to name pleasantly demented. Well appearing and in no acute distress. Eyes: Conjunctivae are normal Head: Atraumatic HEENT: No congestion/rhinnorhea. Mucous membranes are moist.  Oropharynx non-erythematous Neck:   Nontender with no meningismus, no masses, no stridor Cardiovascular: Normal rate, regular rhythm. Grossly normal heart sounds.  Good peripheral circulation. Respiratory: Normal respiratory effort.  No retractions. Lungs CTAB. Abdominal: Soft and nontender. No distention. No guarding no rebound Back:  There is no focal tenderness or step off.  there is no midline tenderness there are no lesions noted. there is no CVA tenderness Musculoskeletal: No lower extremity tenderness, no upper extremity tenderness. No joint effusions, no DVT signs strong distal pulses no edema Neurologic:  Normal speech and language. No gross focal neurologic deficits are appreciated.  Skin:  Skin is warm, dry and intact. No rash noted. Psychiatric: Mood and affect are normal. Speech and behavior are normal.  ____________________________________________   LABS (all labs ordered are  listed, but only abnormal results are displayed)  Labs Reviewed - No data to display  Pertinent labs  results that were available during my care of the patient were reviewed by me and considered in my medical decision making (see chart for details). ____________________________________________  EKG  I personally interpreted any EKGs ordered by me or triage  ____________________________________________  RADIOLOGY  Pertinent labs & imaging results that were available during my care of the patient were reviewed by me and considered in my medical decision making (see  chart for details). If possible, patient and/or family made aware of any abnormal findings. ____________________________________________    PROCEDURES  Procedure(s) performed: None  Procedures  Critical Care performed: None  ____________________________________________   INITIAL IMPRESSION / ASSESSMENT AND PLAN / ED COURSE  Pertinent labs & imaging results that were available during my care of the patient were reviewed by me and considered in my medical decision making (see chart for details).  Patient here after a witnessed non-syncopal fall with a history of dementia, obviously as we cannot completely determine whether she has sustained a significant head injury other clinically there is low suspicion will obtain a CT scan.  It is thought that she did hit her head I do not see any obvious significant injury however.  There is tenderness to palpation of the hips, we will see about trying to get her ambulatory if CT is negative.  ----------------------------------------- 11:26 AM on 08/31/2017 -----------------------------------------  Family at bed side feel she is at her baseline. Do not desire further testing for this dnr pt.  Will ambulate her to make sure no other issues. No pain or tenderness to extremity exams including hips/knees/ankles/shoulders/elbows/ wrists.  In nad.  abd benign. Neck and back non tender.  ----------------------------------------- 2:39 PM on 08/31/2017 -----------------------------------------  Pt ambulatory with no concerns will d/c.    ____________________________________________   FINAL CLINICAL IMPRESSION(S) / ED DIAGNOSES  Final diagnoses:  None      This chart was dictated using voice recognition software.  Despite best efforts to proofread,  errors can occur which can change meaning.      Jeanmarie PlantMcShane, Kaleab Frasier A, MD 08/31/17 [id number]    Jeanmarie PlantMcShane, Zaevion Parke A, MD 08/31/17 1128    Jeanmarie PlantMcShane, Briarrose Shor A, MD 08/31/17 [id number]

## 2017-08-31 NOTE — ED Notes (Signed)
Attempted to ambulate patient. Patient unable to stand or take forward steps at this time. Patient pulling backwards on medics. Denies pain but unable to follow directions. MD made aware. Patient repositioned in bed. Will continue to monitor.

## 2017-09-05 ENCOUNTER — Emergency Department
Admission: EM | Admit: 2017-09-05 | Discharge: 2017-09-05 | Disposition: A | Discharge status: Home / Self Care | Payer: Medicare Other | Attending: Emergency Medicine | Admitting: Emergency Medicine

## 2017-09-05 ENCOUNTER — Other Ambulatory Visit: Payer: Self-pay

## 2017-09-05 DIAGNOSIS — W19XXXD Unspecified fall, subsequent encounter: Secondary | ICD-10-CM

## 2017-09-05 DIAGNOSIS — W010XXA Fall on same level from slipping, tripping and stumbling without subsequent striking against object, initial encounter: Secondary | ICD-10-CM | POA: Insufficient documentation

## 2017-09-05 DIAGNOSIS — Z79899 Other long term (current) drug therapy: Secondary | ICD-10-CM | POA: Insufficient documentation

## 2017-09-05 DIAGNOSIS — F0280 Dementia in other diseases classified elsewhere without behavioral disturbance: Secondary | ICD-10-CM | POA: Diagnosis not present

## 2017-09-05 DIAGNOSIS — G309 Alzheimer's disease, unspecified: Secondary | ICD-10-CM | POA: Insufficient documentation

## 2017-09-05 NOTE — ED Notes (Signed)
Daughter here to pick up pt.   

## 2017-09-05 NOTE — ED Provider Notes (Signed)
[facility] Emergency Department Provider Note  ____________________________________________   First MD Initiated Contact with Patient 09/05/17 [phone]     (approximate)  I have reviewed the triage vital signs and the nursing notes.   HISTORY  Chief Complaint Fall   HPI Sydney Turner is a 76 y.o. female with a history of Alzheimer's dementia who walks with a walker who is presenting with a fall.  Report from the patient's skilled nursing facility says that the patient was [place] from church with her walker when she tripped over the walker and fell forward.  Unsure if the patient hit her head.  There was no noted loss of consciousness.  Patient is complaining of pain but is unable to localize her pain.  She is unable to give further details of the fall due to what appears to be short-term memory loss.  Past Medical History:  Diagnosis Date  . Alzheimer's dementia    With Primary Progressive Aphasia    Patient Active Problem List   Diagnosis Date Noted  . Sepsis secondary to UTI (HCC) 02/03/2017    History reviewed. No pertinent surgical history.  Prior to Admission medications   Medication Sig Start Date End Date Taking? Authorizing Provider  acetaminophen (TYLENOL) 500 MG tablet Take 500 mg every 4 (four) hours as needed by mouth.    [provider]  Calcium Carb-Cholecalciferol (CALCIUM 600+D) 600-800 MG-UNIT TABS Take 1 tablet daily by mouth.    [provider]  Cholecalciferol (D3-1000 PO) Take 1,000 Units daily by mouth.    [provider]  ciclopirox (PENLAC) 8 % solution Apply at bedtime topically. Apply over nail and surrounding skin. Apply daily over previous coat. After seven (7) days, may remove with alcohol and continue cycle.    [provider]  citalopram (CELEXA) 10 MG tablet Take 10 mg daily by mouth.    [provider]  docusate sodium (COLACE) 100 MG capsule Take 100 mg daily by mouth.     [provider]  guaifenesin (ROBITUSSIN) 100 MG/5ML syrup Take 200 mg every 6 (six) hours as needed by mouth for cough.    [provider]  levothyroxine (SYNTHROID, LEVOTHROID) 112 MCG tablet Take 112 mcg by mouth daily before breakfast.    [provider]  loperamide (IMODIUM) 2 MG capsule Take 2 mg as needed by mouth for diarrhea or loose stools.    [provider]  loratadine (CLARITIN) 10 MG tablet Take 10 mg daily by mouth.    [provider]  LORazepam (ATIVAN) 0.5 MG tablet Take 0.25 mg 3 (three) times daily as needed by mouth (Agitation).    [provider]  magnesium hydroxide (MILK OF MAGNESIA) 400 MG/5ML suspension Take 15 mLs at bedtime as needed by mouth for mild constipation.    [provider]  Multiple Vitamin (MULTIVITAMIN) tablet Take 1 tablet daily by mouth.    [provider]  nitrofurantoin (MACRODANTIN) 100 MG capsule Take 100 mg daily by mouth.    [provider]  QUEtiapine (SEROQUEL) 25 MG tablet Take 25 mg daily by mouth.    [provider]  traZODone (DESYREL) 50 MG tablet Take 1 tablet by mouth as needed. 01/05/17   [provider]    Allergies Amoxicillin; Penicillins; and Sulfa antibiotics  No family history on file.  Social History Social History   Tobacco Use  . Smoking status: Never Smoker  . Smokeless tobacco: Never Used  Substance Use Topics  .  Alcohol use: Not on file  . Drug use: Not on file    Review of Systems  Level 5 caveat secondary to dementia   ____________________________________________   PHYSICAL EXAM:  VITAL SIGNS: ED Triage Vitals [09/05/17 0950]  Enc Vitals Group     BP (!) 139/56     Pulse Rate 75     Resp 16     Temp (!) 97.4 F (36.3 C)     Temp Source Axillary     SpO2 100 %     Weight 140 lb (63.5 kg)     Height 5\' 5"  (1.651 m)     Head Circumference      Peak Flow      Pain Score      Pain Loc      Pain Edu?       Excl. in GC?     Constitutional: Alert and oriented to self only. Well appearing and in no acute distress. Eyes: Conjunctivae are normal.  Head: Atraumatic. Nose: No congestion/rhinnorhea. Mouth/Throat: Mucous membranes are moist.  Neck: No stridor.  No tenderness to the cervical spine.  No deformity or step-off. Cardiovascular: Normal rate, regular rhythm. Grossly normal heart sounds.   Respiratory: Normal respiratory effort.  No retractions. Lungs CTAB. Gastrointestinal: Soft and nontender. No distention. No CVA tenderness. Musculoskeletal: No lower extremity tenderness nor edema.  No joint effusions.  Tenderness over the chest wall.  Moving her bilateral lower extremities.  No tenderness the bilateral hips. Neurologic:  Normal speech and language. No gross focal neurologic deficits are appreciated. Skin:  Skin is warm, dry and intact. No rash noted. Psychiatric: Mood and affect are normal. Speech and behavior are normal.  ____________________________________________   LABS (all labs ordered are listed, but only abnormal results are displayed)  Labs Reviewed - No data to display ____________________________________________  EKG   ____________________________________________  RADIOLOGY   ____________________________________________   PROCEDURES  Procedure(s) performed:   Procedures  Critical Care performed:   ____________________________________________   INITIAL IMPRESSION / ASSESSMENT AND PLAN / ED COURSE  Pertinent labs & imaging results that were available during my care of the patient were reviewed by me and considered in my medical decision making (see chart for details).  DDX: Hip fracture, contusion, back pain, neck pain, intracranial hemorrhage, skull fracture  As part of my medical decision making, I reviewed the following data within the electronic MEDICAL RECORD NUMBER Notes from prior ED visits  ----------------------------------------- 9:57 AM  on 09/05/2017 -----------------------------------------  Patient with a recent CT head without any evidence of acute injury.  I discussed the case with the patient's daughter, Gary FleetHolly Williams.  She agrees to hold off on imaging at this time and says that her and her sister will come to the emergency department and will decide on further workup if any at that point.  Patient ambulating without difficulty with only minimal assistance.    ----------------------------------------- 10:54 AM on 09/05/2017 -----------------------------------------  Patient's 2 daughters are in the room.  They agree that the patient will forego further imaging at this time as she has had recent imaging and frequent falls and appears to be at her baseline.  Patient was able to walk in the emergency department.  We discussed the risks and benefits of imaging and signs and symptoms to look out for to bring the patient back for further testing.  The family is understanding of the plan willing to comply.  Will be discharged at this time.  ____________________________________________   FINAL  CLINICAL IMPRESSION(S) / ED DIAGNOSES  Mechanical fall.    NEW MEDICATIONS STARTED DURING THIS VISIT:  This SmartLink is deprecated. Use AVSMEDLIST instead to display the medication list for a patient.   Note:  This document was prepared using Dragon voice recognition software and may include unintentional dictation errors.     Myrna BlazerSchaevitz, Oluwatamilore Starnes Matthew, MD 09/05/17 1054

## 2017-09-05 NOTE — ED Triage Notes (Signed)
Pt came to ED via EMS from [facility] post fall. Witnessed, mechanical fall. Advanced dementia. VS stable

## 2017-09-06 ENCOUNTER — Emergency Department: Payer: Medicare Other

## 2017-09-06 ENCOUNTER — Emergency Department
Admission: EM | Admit: 2017-09-06 | Discharge: 2017-09-06 | Disposition: A | Discharge status: Home / Self Care | Payer: Medicare Other | Attending: Student in an Organized Health Care Education/Training Program | Admitting: Student in an Organized Health Care Education/Training Program

## 2017-09-06 DIAGNOSIS — F0281 Dementia in other diseases classified elsewhere with behavioral disturbance: Secondary | ICD-10-CM | POA: Diagnosis not present

## 2017-09-06 DIAGNOSIS — W01190A Fall on same level from slipping, tripping and stumbling with subsequent striking against furniture, initial encounter: Secondary | ICD-10-CM | POA: Diagnosis not present

## 2017-09-06 DIAGNOSIS — Y9301 Activity, walking, marching and hiking: Secondary | ICD-10-CM | POA: Diagnosis not present

## 2017-09-06 DIAGNOSIS — G309 Alzheimer's disease, unspecified: Secondary | ICD-10-CM | POA: Insufficient documentation

## 2017-09-06 DIAGNOSIS — S0990XA Unspecified injury of head, initial encounter: Secondary | ICD-10-CM | POA: Diagnosis not present

## 2017-09-06 DIAGNOSIS — Z79899 Other long term (current) drug therapy: Secondary | ICD-10-CM | POA: Insufficient documentation

## 2017-09-06 DIAGNOSIS — Y92129 Unspecified place in nursing home as the place of occurrence of the external cause: Secondary | ICD-10-CM | POA: Diagnosis not present

## 2017-09-06 DIAGNOSIS — Y999 Unspecified external cause status: Secondary | ICD-10-CM | POA: Diagnosis not present

## 2017-09-06 DIAGNOSIS — W19XXXA Unspecified fall, initial encounter: Secondary | ICD-10-CM

## 2017-09-06 NOTE — ED Triage Notes (Signed)
She arrives today via [facility] from [facility] where she experienced a fall this am while pushing a chair in the dining room  Staff reports that she hit her head on a corner  Pt with history of advance dementia and is a poor historian  Per EMS pt has fallen x3 in the last week

## 2017-09-06 NOTE — ED Notes (Addendum)
Patient is now going to [facility] via ambulance per the patient's daughter. Patient's daughter will stay to speak to hospital case manager/social worker.

## 2017-09-06 NOTE — ED Notes (Signed)
Pt presents post fall with no obvious injury. Pt has fallen multiple times over last several weeks. Pt unable to follow commands or answer questions appropriately. Pt in no obvious distress at this time.

## 2017-09-06 NOTE — ED Notes (Signed)
Daughter Jeanice LimHolly is at bedside and received report. Jeanice LimHolly states she will take the patient back to [facility].

## 2017-09-06 NOTE — ED Notes (Signed)
Patient's brief was urine-soaked and patient was changed into a diaper.

## 2017-09-06 NOTE — ED Provider Notes (Addendum)
[facility] Emergency Department Provider Note    None    (approximate)  I have reviewed the triage vital signs and the nursing notes.   HISTORY  Chief Complaint Fall  Level V Caveat:  dementia  HPI Sydney Turner is a 76 y.o. female with a history of Alzheimer's dementia presents after mechanical fall and hitting her head on a corner of a piece of furniture.  Patient with limited history.  No report of loss of consciousness.  No evidence of other injury.  She denies any other pain.  Past Medical History:  Diagnosis Date  . Alzheimer's dementia    With Primary Progressive Aphasia   No family history on file. No past surgical history on file. Patient Active Problem List   Diagnosis Date Noted  . Sepsis secondary to UTI (HCC) 02/03/2017      Prior to Admission medications   Medication Sig Start Date End Date Taking? Authorizing Provider  acetaminophen (TYLENOL) 500 MG tablet Take 500 mg every 4 (four) hours as needed by mouth.   Yes [provider]  Calcium Carb-Cholecalciferol (CALCIUM 600+D) 600-800 MG-UNIT TABS Take 1 tablet daily by mouth.   Yes [provider]  Cholecalciferol (D3-1000 PO) Take 1,000 Units daily by mouth.   Yes [provider]  ciclopirox (PENLAC) 8 % solution Apply at bedtime topically. Apply over nail and surrounding skin. Apply daily over previous coat. After seven (7) days, may remove with alcohol and continue cycle.   Yes [provider]  citalopram (CELEXA) 10 MG tablet Take 10 mg daily by mouth.   Yes [provider]  docusate sodium (COLACE) 100 MG capsule Take 100 mg daily by mouth.   Yes [provider]  gentamicin (GARAMYCIN) 0.3 % ophthalmic solution Place 1 drop 4 (four) times daily into the right eye. 09/03/17 09/10/17 Yes [provider]  guaifenesin (ROBITUSSIN) 100 MG/5ML syrup Take 200 mg every 6 (six) hours as needed by mouth for cough.   Yes  [provider]  levothyroxine (SYNTHROID, LEVOTHROID) 112 MCG tablet Take 112 mcg by mouth daily before breakfast.   Yes [provider]  loperamide (IMODIUM) 2 MG capsule Take 2 mg as needed by mouth for diarrhea or loose stools.   Yes [provider]  loratadine (CLARITIN) 10 MG tablet Take 10 mg daily by mouth.   Yes [provider]  LORazepam (ATIVAN) 0.5 MG tablet Take 0.25 mg 3 (three) times daily as needed by mouth (Agitation).   Yes [provider]  magnesium hydroxide (MILK OF MAGNESIA) 400 MG/5ML suspension Take 15 mLs at bedtime as needed by mouth for mild constipation.   Yes [provider]  Multiple Vitamin (MULTIVITAMIN) tablet Take 1 tablet daily by mouth.   Yes [provider]  nitrofurantoin (MACRODANTIN) 100 MG capsule Take 100 mg daily by mouth.   Yes [provider]  QUEtiapine (SEROQUEL) 25 MG tablet Take 25 mg daily by mouth.   Yes [provider]  traZODone (DESYREL) 50 MG tablet Take 50 mg at bedtime by mouth.  01/05/17  Yes [provider]    Allergies Amoxicillin; Penicillins; and Sulfa antibiotics    Social History Social History   Tobacco Use  . Smoking status: Never Smoker  . Smokeless tobacco: Never Used  Substance Use Topics  . Alcohol use: Not on file  . Drug use: Not on file    Review of Systems Patient denies headaches, rhinorrhea, blurry vision, numbness,  shortness of breath, chest pain, edema, cough, abdominal pain, nausea, vomiting, diarrhea, dysuria, fevers, rashes or hallucinations unless otherwise stated above in HPI. ____________________________________________   PHYSICAL EXAM:  VITAL SIGNS: Vitals:   09/06/17 1049 09/06/17 1119  BP: (!) 130/52 (!) 147/69  Pulse: 72 73  Resp: 17   Temp:    SpO2: 96% 96%    Constitutional: Alert, in no acute distress. Eyes: Conjunctivae are normal.  Head: Atraumatic. Nose: No  congestion/rhinnorhea. Mouth/Throat: Mucous membranes are moist.   Neck: No stridor. Painless ROM.  Cardiovascular: Normal rate, regular rhythm. Grossly normal heart sounds.  Good peripheral circulation. Respiratory: Normal respiratory effort.  No retractions. Lungs CTAB. Gastrointestinal: Soft and nontender. No distention. No abdominal bruits. No CVA tenderness. Musculoskeletal: No lower extremity tenderness nor edema.  No joint effusions. Neurologic:  no gross focal neurologic deficits are appreciated. No facial droop Skin:  Skin is warm, dry and intact. No rash noted. Psychiatric: Mood and affect are normal. Speech and behavior are normal.  ____________________________________________   LABS (all labs ordered are listed, but only abnormal results are displayed)  No results found for this or any previous visit (from the past 24 hour(s)). ____________________________________________  EKG____________________________________________  RADIOLOGY  I personally reviewed all radiographic images ordered to evaluate for the above acute complaints and reviewed radiology reports and findings.  These findings were personally discussed with the patient.  Please see medical record for radiology report.  ____________________________________________   PROCEDURES  Procedure(s) performed:  Procedures    Critical Care performed: no ____________________________________________   INITIAL IMPRESSION / ASSESSMENT AND PLAN / ED COURSE  Pertinent labs & imaging results that were available during my care of the patient were reviewed by me and considered in my medical decision making (see chart for details).  DDX: iph, contusion, laceration, fracture,   Sydney Turner is a 76 y.o. who presents to the ED with fall today at [facility].  Patient's evaluation, gated by her history of dementia but patient is well-appearing and in no acute distress.  Has had multiple falls in the past several  weeks.  CT imaging ordered to evaluate for acute traumatic injury shows no acute abnormality.  Remainder of exam shows no evidence of traumatic injury.  She has no thoracic or lower C-spine tenderness to palpation.  Report of possible superior endplate fracture I do suspect as subacute or chronic.  Do not feel that further emergent diagnostic imaging clinically indicated at this time.  Clinical Course as of Sep 06 1218  Mon Sep 06, 2017  1147 Spoke with patient's daughter at bedside and she is concerned about the level of care she is receiving at the nursing facility.  Has been having multiple falls.  I medically think that she is stable for discharge back there but would require physical therapy evaluation.  We will have social work consult as the patient may qualify for other facilities.  [PR]    Clinical Course User Index [PR] Willy Eddyobinson, Mazie Fencl, MD     ____________________________________________   FINAL CLINICAL IMPRESSION(S) / ED DIAGNOSES  Final diagnoses:  Fall, initial encounter  Minor head injury, initial encounter      NEW MEDICATIONS STARTED DURING THIS VISIT:  This SmartLink is deprecated. Use AVSMEDLIST instead to display the medication list for a patient.   Note:  This document was prepared using Dragon voice recognition software and may include unintentional dictation errors.    Willy Eddyobinson, Maliik Karner, MD 09/06/17 1107    Willy Eddyobinson, Ying Blankenhorn, MD 09/06/17 [phone]

## 2017-09-06 NOTE — Clinical Social Work Note (Signed)
CSW received a Social Work consult to disucss placement concerns with pt's daughter. CSW met with Pt's daughter Arnette Felts in the family consultation room at length about dtr's concerns for pt care at [facility]. CSW and dtr discussed a plan for dtr to meet with [facility] staff to address concerns, start researching other ALF/SNF options, consult with the [place] [employer], and access caregiver support for dtr. CSW provided resource information for the [place] [employer], a listing of locked ALF/SNF options, and caregiver support groups. Dtr emotional and tearful when speaking of the concerns of her mother and balancing homelife and care for her mother. CSW provided brief solution focused counseling, active listening, validation and positive reinforcement. Dtr was pleased with information provided and thanked CSW for assistance. CSW signing off as no further Social Work needs identified.   Oretha Ellis, Latanya Presser, [place] Social Worker-ED [phone]

## 2017-09-06 NOTE — ED Notes (Signed)
[facility] is here for discharge.

## 2017-09-14 ENCOUNTER — Emergency Department: Payer: Medicare Other

## 2017-09-14 ENCOUNTER — Other Ambulatory Visit: Payer: Self-pay

## 2017-09-14 ENCOUNTER — Encounter: Payer: Self-pay | Admitting: Emergency Medicine

## 2017-09-14 ENCOUNTER — Emergency Department
Admission: EM | Admit: 2017-09-14 | Discharge: 2017-09-14 | Disposition: A | Discharge status: Home / Self Care | Payer: Medicare Other | Attending: Emergency Medicine | Admitting: Emergency Medicine

## 2017-09-14 DIAGNOSIS — R531 Weakness: Secondary | ICD-10-CM | POA: Diagnosis present

## 2017-09-14 DIAGNOSIS — M6281 Muscle weakness (generalized): Secondary | ICD-10-CM | POA: Insufficient documentation

## 2017-09-14 DIAGNOSIS — Z79899 Other long term (current) drug therapy: Secondary | ICD-10-CM | POA: Insufficient documentation

## 2017-09-14 DIAGNOSIS — F0390 Unspecified dementia without behavioral disturbance: Secondary | ICD-10-CM | POA: Diagnosis not present

## 2017-09-14 HISTORY — DX: Aphasia: R47.01

## 2017-09-14 HISTORY — DX: Age-related osteoporosis without current pathological fracture: M81.0

## 2017-09-14 HISTORY — DX: Unspecified dementia, unspecified severity, with other behavioral disturbance: F03.918

## 2017-09-14 HISTORY — DX: Unspecified dementia with behavioral disturbance: F03.91

## 2017-09-14 LAB — URINALYSIS, COMPLETE (UACMP) WITH MICROSCOPIC
Bilirubin Urine: NEGATIVE
GLUCOSE, UA: NEGATIVE mg/dL
Ketones, ur: NEGATIVE mg/dL
Leukocytes, UA: NEGATIVE
Nitrite: NEGATIVE
PH: 7 (ref 5.0–8.0)
Protein, ur: NEGATIVE mg/dL
Specific Gravity, Urine: 1.011 (ref 1.005–1.030)

## 2017-09-14 LAB — BASIC METABOLIC PANEL
Anion gap: 8 (ref 5–15)
BUN: 21 mg/dL — AB (ref 6–20)
CALCIUM: 9.9 mg/dL (ref 8.9–10.3)
CO2: 28 mmol/L (ref 22–32)
CREATININE: 0.82 mg/dL (ref 0.44–1.00)
Chloride: 101 mmol/L (ref 101–111)
GFR calc Af Amer: >60 mL/min (ref >60)
GLUCOSE: 95 mg/dL (ref 65–99)
Potassium: 4.1 mmol/L (ref 3.5–5.1)
Sodium: 137 mmol/L (ref 135–145)

## 2017-09-14 LAB — CBC
HCT: 35.1 % (ref 35.0–47.0)
Hemoglobin: 11.9 g/dL — ABNORMAL LOW (ref 12.0–16.0)
MCH: 32.2 pg (ref 26.0–34.0)
MCHC: 33.8 g/dL (ref 32.0–36.0)
MCV: 95.3 fL (ref 80.0–100.0)
PLATELETS: 208 10*3/uL (ref 150–440)
RBC: 3.68 MIL/uL — ABNORMAL LOW (ref 3.80–5.20)
RDW: 13.8 % (ref 11.5–14.5)
WBC: 5.9 10*3/uL (ref 3.6–11.0)

## 2017-09-14 MED ORDER — SODIUM CHLORIDE 0.9 % IV BOLUS (SEPSIS)
500.0000 mL | Freq: Once | INTRAVENOUS | Status: AC
  Administered 2017-09-14: 500 mL via INTRAVENOUS

## 2017-09-14 NOTE — ED Notes (Signed)
Social work at bedside to talk to family.

## 2017-09-14 NOTE — ED Notes (Signed)
Pt given applesauce to eat.

## 2017-09-14 NOTE — ED Notes (Signed)
This tech arrived in pt room per RN and dr Alphonzo Lemmingsmcshane and PA student present to get pt up TO SEE HOW PT AMBULATES; pt with assistance from this tech and pa student sat up in bed and pt ambulated with this tech and pa student walked 10 steps leaning more torwards right with weakness present on right side; after dr Alphonzo Lemmingsmcshane offered to repeat ct and left room pt family asked to tell dr Alphonzo Lemmingsmcshane to decline ct; this tech informed him and kate, rn; pt also had repeat ekg per dr Alphonzo Lemmingsmcshane; when this tech left room pt laying comfortably on right side and with several blankets

## 2017-09-14 NOTE — ED Notes (Signed)
Pt family came out asking for pt to be changed. Pt brief soiled with urine. Pt cleaned with warm wipes and new brief placed on pt. Tolerated well.

## 2017-09-14 NOTE — ED Provider Notes (Addendum)
[facility] Emergency Department Provider Note  ____________________________________________   I have reviewed the triage vital signs and the nursing notes.   HISTORY  Chief Complaint Weakness    HPI Prescott GumMargaret K Claud is a 76 y.o. female for some severe ongoing progressive dementia, she also has a history of unsteady gait, she has been falling more recently than normal over the last month or so.  She has been here several times for this.  Today, she did not quite fall but she felt that she might and she was caught by staff.  No impact to the ground.  She has had 2- CT scans in the last 2 weeks for falls.  She does not use a walker, and the family has requested a PT consult when this began happening several months ago and they have yet to see physical therapy.  Patient is DNR.  She herself cannot give any history history as per family, EMS and nursing home facility whom I have called and talked to.  Patient saw her primary care doctor yesterday, they are starting to wean off some sedating medications that she is on several.  If family is concerned about her unsteady gait.   Level 5 chart caveat; no further history available due to patient status.   Past Medical History:  Diagnosis Date  . Alzheimer's dementia    With Primary Progressive Aphasia  . Aphasia   . Dementia with behavioral disturbance   . Osteoporosis     Patient Active Problem List   Diagnosis Date Noted  . Sepsis secondary to UTI (HCC) 02/03/2017    History reviewed. No pertinent surgical history.  Prior to Admission medications   Medication Sig Start Date End Date Taking? Authorizing Provider  Calcium Carb-Cholecalciferol (CALCIUM 600+D) 600-800 MG-UNIT TABS Take 1 tablet daily by mouth.   Yes [provider]  Cholecalciferol (D3-1000 PO) Take 1,000 Units daily by mouth.   Yes [provider]  ciclopirox (PENLAC) 8 % solution Apply at bedtime topically. Apply over nail and  surrounding skin. Apply daily over previous coat. After seven (7) days, may remove with alcohol and continue cycle.   Yes [provider]  citalopram (CELEXA) 10 MG tablet Take 10 mg daily by mouth.   Yes [provider]  docusate sodium (COLACE) 100 MG capsule Take 100 mg daily by mouth.   Yes [provider]  levothyroxine (SYNTHROID, LEVOTHROID) 112 MCG tablet Take 112 mcg by mouth daily before breakfast.   Yes [provider]  loratadine (CLARITIN) 10 MG tablet Take 10 mg daily by mouth.   Yes [provider]  Multiple Vitamin (MULTIVITAMIN) tablet Take 1 tablet daily by mouth.   Yes [provider]  nitrofurantoin (MACRODANTIN) 100 MG capsule Take 100 mg daily by mouth.   Yes [provider]  QUEtiapine (SEROQUEL) 25 MG tablet Take 25 mg daily by mouth.   Yes [provider]  traZODone (DESYREL) 50 MG tablet Take 50 mg at bedtime by mouth.  01/05/17  Yes [provider]  acetaminophen (TYLENOL) 500 MG tablet Take 500 mg every 4 (four) hours as needed by mouth.    [provider]  guaifenesin (ROBITUSSIN) 100 MG/5ML syrup Take 200 mg every 6 (six) hours as needed by mouth for cough.    [provider]  loperamide (IMODIUM) 2 MG capsule Take 2 mg as needed by mouth for diarrhea or loose stools.    [provider]  LORazepam (ATIVAN) 0.5 MG  tablet Take 0.25 mg 3 (three) times daily as needed by mouth (Agitation).    [provider]  magnesium hydroxide (MILK OF MAGNESIA) 400 MG/5ML suspension Take 15 mLs at bedtime as needed by mouth for mild constipation.    [provider]    Allergies Amoxicillin; Penicillins; and Sulfa antibiotics  No family history on file.  Social History Social History   Tobacco Use  . Smoking status: Never Smoker  . Smokeless tobacco: Never Used  Substance Use Topics  . Alcohol use: No    Frequency: Never  . Drug use: No    Review of  Systems Level 5 chart caveat; no further history available due to patient status.   ____________________________________________   PHYSICAL EXAM:  VITAL SIGNS: ED Triage Vitals  Enc Vitals Group     BP 09/14/17 1107 (!) 115/98     Pulse Rate 09/14/17 1107 71     Resp 09/14/17 1107 16     Temp 09/14/17 1107 (!) 97.4 F (36.3 C)     Temp Source 09/14/17 1107 Axillary     SpO2 09/14/17 1107 94 %     Weight 09/14/17 1058 118 lb 2.7 oz (53.6 kg)     Height 09/14/17 1058 5\' 5"  (1.651 m)     Head Circumference --      Peak Flow --      Pain Score --      Pain Loc --      Pain Edu? --      Excl. in GC? --     Constitutional: Alert and easily demented no acute distress at baseline per family Eyes: Conjunctivae are normal Head: Atraumatic HEENT: No congestion/rhinnorhea. Mucous membranes are moist.  Oropharynx non-erythematous Neck:   Nontender with no meningismus, no masses, no stridor Cardiovascular: Normal rate, regular rhythm. Grossly normal heart sounds.  Good peripheral circulation. Respiratory: Normal respiratory effort.  No retractions. Lungs CTAB. Abdominal: Soft and nontender. No distention. No guarding no rebound Back:  There is no focal tenderness or step off.  there is no midline tenderness there are no lesions noted. there is no CVA tenderness Musculoskeletal: No lower extremity tenderness, no upper extremity tenderness. No joint effusions, no DVT signs strong distal pulses no edema Neurologic:  Normal speech and language. No gross focal neurologic deficits are appreciated.  Skin:  Skin is warm, dry and intact. No rash noted. Psychiatric: Mood and affect are normal. Speech and behavior are normal.  ____________________________________________   LABS (all labs ordered are listed, but only abnormal results are displayed)  Labs Reviewed  CBC - Abnormal; Notable for the following components:      Result Value   RBC 3.68 (*)    Hemoglobin 11.9 (*)    All other  components within normal limits  BASIC METABOLIC PANEL - Abnormal; Notable for the following components:   BUN 21 (*)    All other components within normal limits  URINALYSIS, COMPLETE (UACMP) WITH MICROSCOPIC - Abnormal; Notable for the following components:   Color, Urine AMBER (*)    APPearance CLOUDY (*)    Hgb urine dipstick SMALL (*)    Bacteria, UA RARE (*)    Squamous Epithelial / LPF 0-5 (*)    All other components within normal limits  URINE CULTURE    Pertinent labs  results that were available during my care of the patient were reviewed by me and considered in my medical decision making (see chart for details). ____________________________________________  EKG  I  personally interpreted any EKGs ordered by me or triage Initial EKG showed artifact that was repeated, repeat EKG shows sinus tach, patient had just become somewhat agitated when EKG was performed, rate 115 normal axis ____________________________________________  RADIOLOGY  Pertinent labs & imaging results that were available during my care of the patient were reviewed by me and considered in my medical decision making (see chart for details). If possible, patient and/or family made aware of any abnormal findings.  No results found. ____________________________________________    PROCEDURES  Procedure(s) performed: None  Procedures  Critical Care performed: None  ____________________________________________   INITIAL IMPRESSION / ASSESSMENT AND PLAN / ED COURSE  Pertinent labs & imaging results that were available during my care of the patient were reviewed by me and considered in my medical decision making (see chart for details). Patient here with progressive ongoing weakness likely secondary to dementia, has been going on for months, acutely worse over the last several weeks.  Family declines a CT scan and I have canceled it.  She has had 2- CT scans thus far.  Urinalysis does not show what  appears to be urinary tract infection to be but I am sending a urine culture she is on suppressive therapy with Macrobid.  CBC is reassuring she is not anemic or showing signs of a high white count.  There is no evidence of acute ischemia.  Patient is not in any dysrhythmia here on the monitor.  She is nonfocal and her neurologic exam to the extent that she participates.  She is able to sit up and stand on her own but does require assistance with walking.  She is not using a walker at the facility.  I have called the facility and suggested that they obtain physical therapy consult for her and they will do so.  They will also make sure that she uses a walker as needed.  I have informally discussed this patient with neurology and they have no further recommendations.  I have discussed with the family about admission, at this time that she does not meet criteria for admission is everything that they can do for ongoing workup of her progressive gait instability is available to them as an outpatient in addition, patient is much more likely to become discombobulated if admitted.  In addition, there is possibly some elements of polypharmacy to this and family are working on that as well.  There is no evidence of acute pathology today and we will I believe safely discharge the patient home she can see her primary care doctor tomorrow in the facility.  They are also somewhat frustrated with the facility and I have had case management talk to them about ongoing options for them to work and advocate for the safety of their mother.  Extensive return precautions and follow-up given and understood.  They do understand that without CT scan there is a limited what I can evaluate but at this time there is no evidence of head injury and she did not even fall today.  ----------------------------------------- 3:50 PM on 09/14/2017 -----------------------------------------  Patient remains at her baseline, family agree with  discharge at this time.  They do feel the patient would decline if admitted there is no acute pathology today that requires admission and they will work with the nursing home on further care, patient likely will require some assistance with walking such as a walker which I have discussed with the nursing home and they agreed to.  They also agreed to physical  therapy consultation via the PCP.  The patient herself has no complaints family are concerned about her decline and they are interested in hospice.  They do not wish her to be admitted at this time.  Return precautions and follow-up given and understood and the family understands that they can return at any time.  They can also changes her mind about CT, however, even if the patient had had an occult stroke which was causing her symptoms starting 3 weeks to a month ago, it is unlikely any acute intervention would be required and there be significant risk to any degree of anticoagulation.  They will will continue to titrate down her medications.    ____________________________________________   FINAL CLINICAL IMPRESSION(S) / ED DIAGNOSES  Final diagnoses:  None      This chart was dictated using voice recognition software.  Despite best efforts to proofread,  errors can occur which can change meaning.      Jeanmarie Plant, MD 09/14/17 1520    Jeanmarie Plant, MD 09/14/17 [phone]

## 2017-09-14 NOTE — Care Management Note (Signed)
Case Management Note  Patient Details  Name: Sydney Turner MRN: [id number] Date of Birth: [birth year]  Subjective/Objective:     Spoke at length to both dauhters at bedside. Verified that the plan set up with CSW was followed, and the OMBUDSMAN is due to visit the facility tomorrow. For today I am supplying them with the information for [facility], so that they can report their concerns . I have asked them if they are seeking a new facility and they tell me this was the only bed option available. I have let the MD know of the plan.               Action/Plan:   Expected Discharge Date:                  Expected Discharge Plan:     In-House Referral:     Discharge planning Services     Post Acute Care Choice:    Choice offered to:     DME Arranged:    DME Agency:     HH Arranged:    HH Agency:     Status of Service:     If discussed at [employer] Length of Stay Meetings, dates discussed:    Additional Comments:  Berna BueCheryl Felipe Cabell, RN 09/14/2017, 1:02 PM

## 2017-09-14 NOTE — ED Notes (Signed)
Visitor at bedside.

## 2017-09-14 NOTE — ED Notes (Signed)
Fall bell placed on pt R shoulder.

## 2017-09-14 NOTE — ED Triage Notes (Signed)
Patient from [facility] via [facility]. Staff reports patient was walking across dining hall and they "saw her knees start to buckle". Staff reports they were able to catch patient before she fell. Patient oriented at baseline per staff. History of dementia. Patient in NAD upon arrival. Even respirations and relaxed facial expressions noted.

## 2017-09-14 NOTE — Discharge Instructions (Signed)
Follow closely with primary care doctor return to the emergency room for any new or worrisome symptoms.

## 2017-09-17 LAB — URINE CULTURE

## 2017-09-18 NOTE — Progress Notes (Signed)
ED Culture Report Follow up  Pt seen in ED 11/27 for weakness. UA with 0-5 WBC, and no leuk/nitrites. UCx growing aerococcus. Spoke with EDP Dr Marisa SeverinSiadecki who agreed with holding off on starting antibiotics at this time but asked writer to query pt for fever and other urinary sxs.   [facility] - [facility] who stated pt has had no fever, no pain, no signs of urinary frequency and is otherwise doing ok. No antibiotic started at this time.

## 2018-05-20 ENCOUNTER — Emergency Department: Payer: Medicare Other

## 2018-05-20 ENCOUNTER — Encounter: Payer: Self-pay | Admitting: Emergency Medicine

## 2018-05-20 ENCOUNTER — Other Ambulatory Visit: Payer: Self-pay

## 2018-05-20 ENCOUNTER — Emergency Department
Admission: EM | Admit: 2018-05-20 | Discharge: 2018-05-20 | Disposition: A | Discharge status: Skilled Nursing Facility | Payer: Medicare Other | Attending: Emergency Medicine | Admitting: Emergency Medicine

## 2018-05-20 DIAGNOSIS — G309 Alzheimer's disease, unspecified: Secondary | ICD-10-CM | POA: Diagnosis not present

## 2018-05-20 DIAGNOSIS — N390 Urinary tract infection, site not specified: Secondary | ICD-10-CM | POA: Insufficient documentation

## 2018-05-20 DIAGNOSIS — Z79899 Other long term (current) drug therapy: Secondary | ICD-10-CM | POA: Insufficient documentation

## 2018-05-20 DIAGNOSIS — R569 Unspecified convulsions: Secondary | ICD-10-CM | POA: Diagnosis present

## 2018-05-20 HISTORY — DX: Unspecified dementia, unspecified severity, without behavioral disturbance, psychotic disturbance, mood disturbance, and anxiety: F03.90

## 2018-05-20 LAB — COMPREHENSIVE METABOLIC PANEL
ALBUMIN: 4.1 g/dL (ref 3.5–5.0)
ALT: 17 U/L (ref 0–44)
AST: 23 U/L (ref 15–41)
Alkaline Phosphatase: 56 U/L (ref 38–126)
Anion gap: 7 (ref 5–15)
BUN: 25 mg/dL — AB (ref 8–23)
CHLORIDE: 105 mmol/L (ref 98–111)
CO2: 30 mmol/L (ref 22–32)
CREATININE: 0.72 mg/dL (ref 0.44–1.00)
Calcium: 9.8 mg/dL (ref 8.9–10.3)
GFR calc Af Amer: >60 mL/min (ref >60)
GFR calc non Af Amer: >60 mL/min (ref >60)
GLUCOSE: 87 mg/dL (ref 70–99)
Potassium: 4 mmol/L (ref 3.5–5.1)
Sodium: 142 mmol/L (ref 135–145)
Total Bilirubin: 1 mg/dL (ref 0.3–1.2)
Total Protein: 7.1 g/dL (ref 6.5–8.1)

## 2018-05-20 LAB — URINALYSIS, COMPLETE (UACMP) WITH MICROSCOPIC
Bilirubin Urine: NEGATIVE
GLUCOSE, UA: NEGATIVE mg/dL
Ketones, ur: NEGATIVE mg/dL
Leukocytes, UA: NEGATIVE
Nitrite: POSITIVE — AB
PH: 6 (ref 5.0–8.0)
PROTEIN: NEGATIVE mg/dL
Specific Gravity, Urine: 1.015 (ref 1.005–1.030)

## 2018-05-20 LAB — CBC
HCT: 36.5 % (ref 35.0–47.0)
Hemoglobin: 12.2 g/dL (ref 12.0–16.0)
MCH: 32.9 pg (ref 26.0–34.0)
MCHC: 33.4 g/dL (ref 32.0–36.0)
MCV: 98.4 fL (ref 80.0–100.0)
PLATELETS: 168 10*3/uL (ref 150–440)
RBC: 3.71 MIL/uL — AB (ref 3.80–5.20)
RDW: 13.9 % (ref 11.5–14.5)
WBC: 5.3 10*3/uL (ref 3.6–11.0)

## 2018-05-20 LAB — TROPONIN I

## 2018-05-20 MED ORDER — CEFTRIAXONE SODIUM 1 G IJ SOLR
1.0000 g | Freq: Once | INTRAMUSCULAR | Status: AC
  Administered 2018-05-20: 1 g via INTRAVENOUS
  Filled 2018-05-20: qty 10

## 2018-05-20 MED ORDER — CEPHALEXIN 500 MG PO CAPS: 500.0000 mg | ORAL_CAPSULE | Freq: Three times a day (TID) | ORAL | 0 refills | Status: AC

## 2018-05-20 MED ORDER — SODIUM CHLORIDE 0.9 % IV BOLUS
1000.0000 mL | Freq: Once | INTRAVENOUS | Status: AC
  Administered 2018-05-20: 1000 mL via INTRAVENOUS

## 2018-05-20 MED ORDER — LORAZEPAM 2 MG/ML IJ SOLN
1.0000 mg | Freq: Once | INTRAMUSCULAR | Status: AC
  Administered 2018-05-20: 1 mg via INTRAMUSCULAR
  Filled 2018-05-20: qty 1

## 2018-05-20 NOTE — ED Notes (Signed)
Patient transported to CT 

## 2018-05-20 NOTE — ED Triage Notes (Signed)
Pt here via EMS from [facility] with c/o seizure like activity per staff.  Pt does not have a hx of seizures.  Was found on the floor by staff, no blood thinner listed on PTA med list. Denies any pain appears in NAD.

## 2018-05-20 NOTE — ED Notes (Signed)
Family refusing blood work at this time due to patient normally needing ativan prior to blood draws.

## 2018-05-20 NOTE — ED Triage Notes (Signed)
Per EMS, pt was lowered to the ground-she did not fall and hit the ground with the seizure like activity.

## 2018-05-20 NOTE — ED Notes (Signed)
Pts daughter arrived, stated she had one seizure about 6 months ago and that was her first one.

## 2018-05-20 NOTE — ED Provider Notes (Signed)
[facility] Emergency Department Provider Note ____________________________________________   First MD Initiated Contact with Patient 05/20/18 1345     (approximate)  I have reviewed the triage vital signs and the nursing notes.   HISTORY  Chief Complaint Seizures  HPI Sydney Turner is a 77 y.o. female with history of dementia, just coming off of hospice because of "graduating from hospice," who is presenting to the emergency department after seizure-like activity.  Per family, the patient was lowered from her wheelchair onto the ground.  Unclear if it was a focal seizure versus generalized and how long it lasted.  However, the family says that she had similar seizure-like activity about 6 weeks ago.  Was not placed on any medications and was not brought to the emergency department at that time.  Family is wary about work-up at this time because they are concerned about doing testing that will lead to diagnoses that may not be able to be cured because the patient's terminal state.  However, they are also wanting hospice to get back involved in her cares about a diagnosis for the patient's ailments.  Patient at this time unable to give history.  Family says the patient is at her baseline.  Past Medical History:  Diagnosis Date  . Alzheimer's dementia    With Primary Progressive Aphasia  . Aphasia   . Dementia   . Dementia with behavioral disturbance   . Osteoporosis     Patient Active Problem List   Diagnosis Date Noted  . Sepsis secondary to UTI (HCC) 02/03/2017    History reviewed. No pertinent surgical history.  Prior to Admission medications   Medication Sig Start Date End Date Taking? Authorizing Provider  acetaminophen (TYLENOL) 500 MG tablet Take 500 mg every 4 (four) hours as needed by mouth.    [provider]  Calcium Carb-Cholecalciferol (CALCIUM 600+D) 600-800 MG-UNIT TABS Take 1 tablet daily by mouth.    [provider]    Cholecalciferol (D3-1000 PO) Take 1,000 Units daily by mouth.    [provider]  ciclopirox (PENLAC) 8 % solution Apply at bedtime topically. Apply over nail and surrounding skin. Apply daily over previous coat. After seven (7) days, may remove with alcohol and continue cycle.    [provider]  citalopram (CELEXA) 10 MG tablet Take 10 mg daily by mouth.    [provider]  docusate sodium (COLACE) 100 MG capsule Take 100 mg daily by mouth.    [provider]  guaifenesin (ROBITUSSIN) 100 MG/5ML syrup Take 200 mg every 6 (six) hours as needed by mouth for cough.    [provider]  levothyroxine (SYNTHROID, LEVOTHROID) 112 MCG tablet Take 112 mcg by mouth daily before breakfast.    [provider]  loperamide (IMODIUM) 2 MG capsule Take 2 mg as needed by mouth for diarrhea or loose stools.    [provider]  loratadine (CLARITIN) 10 MG tablet Take 10 mg daily by mouth.    [provider]  LORazepam (ATIVAN) 0.5 MG tablet Take 0.25 mg 3 (three) times daily as needed by mouth (Agitation).    [provider]  magnesium hydroxide (MILK OF MAGNESIA) 400 MG/5ML suspension Take 15 mLs at bedtime as needed by mouth for mild constipation.    [provider]  Multiple Vitamin (MULTIVITAMIN) tablet Take 1 tablet daily by mouth.    [provider]  nitrofurantoin (MACRODANTIN) 100 MG capsule Take 100 mg daily by mouth.  [provider]  QUEtiapine (SEROQUEL) 25 MG tablet Take 25 mg daily by mouth.    [provider]  traZODone (DESYREL) 50 MG tablet Take 50 mg at bedtime by mouth.  01/05/17   [provider]    Allergies Amoxicillin; Penicillins; and Sulfa antibiotics  No family history on file.  Social History Social History   Tobacco Use  . Smoking status: Never Smoker  . Smokeless tobacco: Never Used  Substance Use Topics  . Alcohol use: No    Frequency: Never  .  Drug use: No    Review of Systems  Level 5 caveat secondary to advanced dementia.   ____________________________________________   PHYSICAL EXAM:  VITAL SIGNS: ED Triage Vitals  Enc Vitals Group     BP 05/20/18 1306 (!) 115/58     Pulse Rate 05/20/18 1306 68     Resp 05/20/18 1306 18     Temp --      Temp src --      SpO2 05/20/18 1306 100 %     Weight 05/20/18 1307 115 lb (52.2 kg)     Height 05/20/18 1307 5\' 5"  (1.651 m)     Head Circumference --      Peak Flow --      Pain Score 05/20/18 1307 0     Pain Loc --      Pain Edu? --      Excl. in GC? --     Constitutional: Alert but not responding verbally at this time.  Eyes open spontaneously.  Sitting with legs crossed with family.  no acute distress. Eyes: Conjunctivae are normal.  Head: Atraumatic. Nose: No congestion/rhinnorhea. Mouth/Throat: Mucous membranes are moist.  Neck: No stridor.   Cardiovascular: Normal rate, regular rhythm. Grossly normal heart sounds.  Respiratory: Normal respiratory effort.  No retractions. Lungs CTAB. Gastrointestinal: Soft and nontender. No distention.  Musculoskeletal: No lower extremity tenderness nor edema.  No joint effusions.  No tenderness to bilateral hips. Neurologic:  Normal speech and language. No gross focal neurologic deficits are appreciated. Skin:  Skin is warm, dry and intact. No rash noted. Psychiatric: Mood and affect are normal. Speech and behavior are normal.  ____________________________________________   LABS (all labs ordered are listed, but only abnormal results are displayed)  Labs Reviewed  CBC  COMPREHENSIVE METABOLIC PANEL  TROPONIN I  URINALYSIS, COMPLETE (UACMP) WITH MICROSCOPIC   ____________________________________________  EKG  May 20, 2018 at 1319 Normal sinus rhythm with sinus arrhythmia with a rate of 67. Incomplete right bundle branch block.  Normal axis.  No ST segment elevation or depression.  No abnormal T wave  inversion. ____________________________________________  RADIOLOGY  ED head without any acute process. ____________________________________________   PROCEDURES  Procedure(s) performed:   Procedures  Critical Care performed:   ____________________________________________   INITIAL IMPRESSION / ASSESSMENT AND PLAN / ED COURSE  Pertinent labs & imaging results that were available during my care of the patient were reviewed by me and considered in my medical decision making (see chart for details).  DDX: Seizure, syncope, electrolyte abnormality, brain tumor, UTI As part of my medical decision making, I reviewed the following data within the electronic MEDICAL RECORD NUMBER Notes from prior ED visits  ----------------------------------------- 4:18 PM on 05/20/2018 -----------------------------------------  Discussed with Clydie BraunKaren from hospice who states that the patient would likely need to be evaluated back in her skilled nursing facility.  At this point we are waiting for labs as well as imaging.  ----------------------------------------- 6:54  PM on 05/20/2018 -----------------------------------------  I discussed the case with the neurologist, Dr. Otelia Limes of the consult service who does not recommend starting antiepileptics at this time secondary to the patient have a positive urinalysis for UTI and considering that the patient likely had a provoked seizure from the UTI.  Patient will be treated with Rocephin.  I discussed the case with the patient's daughter, Gary Fleet.  Patient to be disposed back to her nursing home where she will be evaluated again by hospice care. ____________________________________________   FINAL CLINICAL IMPRESSION(S) / ED DIAGNOSES  UTI.  Seizure.   NEW MEDICATIONS STARTED DURING THIS VISIT:  New Prescriptions   No medications on file     Note:  This document was prepared using Dragon voice recognition software and may include  unintentional dictation errors.     Myrna Blazer, MD 05/20/18 [phone]

## 2018-05-20 NOTE — ED Notes (Signed)
Pt's family would like to speak with the MD prior to lab draw.

## 2018-05-20 NOTE — ED Notes (Signed)
Cleaned large BM from pt and changed diaper - family is still refusing labs until they speak with the provider - they state that pt has had one seizure previously but did not respond the same afterwards - family wants a definitive diagnosis if the lab work is draw

## 2018-05-20 NOTE — ED Notes (Signed)
Resumed care from teresa rn.  Pt alert.  Family with pt.  meds given.

## 2018-05-20 NOTE — ED Notes (Signed)
md talking with family

## 2018-05-24 LAB — URINE CULTURE: Culture: >=100000 — AB

## 2018-07-04 ENCOUNTER — Emergency Department
Admission: EM | Admit: 2018-07-04 | Discharge: 2018-07-04 | Disposition: A | Discharge status: Other Acute Inpatient Hospital | Payer: Medicare Other | Attending: Emergency Medicine | Admitting: Emergency Medicine

## 2018-07-04 DIAGNOSIS — Z79899 Other long term (current) drug therapy: Secondary | ICD-10-CM | POA: Insufficient documentation

## 2018-07-04 DIAGNOSIS — N390 Urinary tract infection, site not specified: Secondary | ICD-10-CM

## 2018-07-04 DIAGNOSIS — R531 Weakness: Secondary | ICD-10-CM

## 2018-07-04 DIAGNOSIS — R5383 Other fatigue: Secondary | ICD-10-CM | POA: Diagnosis present

## 2018-07-04 DIAGNOSIS — G309 Alzheimer's disease, unspecified: Secondary | ICD-10-CM | POA: Insufficient documentation

## 2018-07-04 LAB — COMPREHENSIVE METABOLIC PANEL
ALT: 15 U/L (ref 0–44)
ANION GAP: 8 (ref 5–15)
AST: 24 U/L (ref 15–41)
Albumin: 4 g/dL (ref 3.5–5.0)
Alkaline Phosphatase: 53 U/L (ref 38–126)
BUN: 26 mg/dL — ABNORMAL HIGH (ref 8–23)
CHLORIDE: 105 mmol/L (ref 98–111)
CO2: 28 mmol/L (ref 22–32)
CREATININE: 0.8 mg/dL (ref 0.44–1.00)
Calcium: 10.1 mg/dL (ref 8.9–10.3)
Glucose, Bld: 107 mg/dL — ABNORMAL HIGH (ref 70–99)
Potassium: 4.1 mmol/L (ref 3.5–5.1)
Sodium: 141 mmol/L (ref 135–145)
Total Bilirubin: 1.7 mg/dL — ABNORMAL HIGH (ref 0.3–1.2)
Total Protein: 7.1 g/dL (ref 6.5–8.1)

## 2018-07-04 LAB — CBC
HCT: 37.8 % (ref 35.0–47.0)
Hemoglobin: 12.9 g/dL (ref 12.0–16.0)
MCH: 33.4 pg (ref 26.0–34.0)
MCHC: 34.2 g/dL (ref 32.0–36.0)
MCV: 97.6 fL (ref 80.0–100.0)
Platelets: 173 10*3/uL (ref 150–440)
RBC: 3.87 MIL/uL (ref 3.80–5.20)
RDW: 13.8 % (ref 11.5–14.5)
WBC: 4.3 10*3/uL (ref 3.6–11.0)

## 2018-07-04 LAB — URINALYSIS, COMPLETE (UACMP) WITH MICROSCOPIC
BILIRUBIN URINE: NEGATIVE
GLUCOSE, UA: NEGATIVE mg/dL
Ketones, ur: 20 mg/dL — AB
LEUKOCYTES UA: NEGATIVE
NITRITE: POSITIVE — AB
Protein, ur: NEGATIVE mg/dL
SPECIFIC GRAVITY, URINE: 1.017 (ref 1.005–1.030)
pH: 6 (ref 5.0–8.0)

## 2018-07-04 MED ORDER — NITROFURANTOIN MONOHYD MACRO 100 MG PO CAPS: 100.0000 mg | ORAL_CAPSULE | Freq: Two times a day (BID) | ORAL | 0 refills | Status: AC

## 2018-07-04 NOTE — ED Triage Notes (Signed)
Pt presents today from [facility] memory care. Pt presents today for drowiness. Pt VSS WNL Pt is Alert and ambulatory from stretcher to the bed. Pt has dementia and is limited verbal at baseline.

## 2018-07-04 NOTE — ED Notes (Signed)
.   Pt is resting, Respirations even and unlabored, NAD. Stretcher lowest postion and locked. Call bell within reach. Denies any needs at this time RN will continue to monitor.  Family at bedside.  

## 2018-07-04 NOTE — ED Notes (Signed)
.   Pt is resting, Respirations even and unlabored, NAD. Stretcher lowest postion and locked. Call bell within reach. Denies any needs at this time RN will continue to monitor. Urine sent @ 1400

## 2018-07-05 NOTE — ED Provider Notes (Signed)
[facility] Emergency Department Provider Note   ____________________________________________    I have reviewed the triage vital signs and the nursing notes.   HISTORY  Chief Complaint Fatigue  History limited by dementia   HPI Sydney Turner is a 77 y.o. female who was brought in by EMS from facility apparently because she was "more drowsy this morning ".  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  She is unable to provide any history at this time.  No reports of fevers, cough, rash or foul-smelling urine   Past Medical History:  Diagnosis Date  . Alzheimer's dementia    With Primary Progressive Aphasia  . Aphasia   . Dementia   . Dementia with behavioral disturbance   . Osteoporosis     Patient Active Problem List   Diagnosis Date Noted  . Sepsis secondary to UTI (HCC) 02/03/2017    No past surgical history on file.  Prior to Admission medications   Medication Sig Start Date End Date Taking? Authorizing Provider  acetaminophen (TYLENOL) 500 MG tablet Take 500 mg every 4 (four) hours as needed by mouth.    [provider]  Calcium Carb-Cholecalciferol (CALCIUM 600+D) 600-800 MG-UNIT TABS Take 1 tablet daily by mouth.    [provider]  Cholecalciferol (D3-1000 PO) Take 1,000 Units daily by mouth.    [provider]  ciclopirox (PENLAC) 8 % solution Apply at bedtime topically. Apply over nail and surrounding skin. Apply daily over previous coat. After seven (7) days, may remove with alcohol and continue cycle.    [provider]  citalopram (CELEXA) 10 MG tablet Take 10 mg daily by mouth.    [provider]  docusate sodium (COLACE) 100 MG capsule Take 100 mg daily by mouth.    [provider]  guaifenesin (ROBITUSSIN) 100 MG/5ML syrup Take 200 mg every 6 (six) hours as needed by mouth for cough.    [provider]  levothyroxine  (SYNTHROID, LEVOTHROID) 112 MCG tablet Take 112 mcg by mouth daily before breakfast.    [provider]  loperamide (IMODIUM) 2 MG capsule Take 2 mg as needed by mouth for diarrhea or loose stools.    [provider]  loratadine (CLARITIN) 10 MG tablet Take 10 mg daily by mouth.    [provider]  LORazepam (ATIVAN) 0.5 MG tablet Take 0.25 mg 3 (three) times daily as needed by mouth (Agitation).    [provider]  magnesium hydroxide (MILK OF MAGNESIA) 400 MG/5ML suspension Take 15 mLs at bedtime as needed by mouth for mild constipation.    [provider]  Multiple Vitamin (MULTIVITAMIN) tablet Take 1 tablet daily by mouth.    [provider]  nitrofurantoin (MACRODANTIN) 100 MG capsule Take 100 mg daily by mouth.    [provider]  nitrofurantoin, macrocrystal-monohydrate, (MACROBID) 100 MG capsule Take 1 capsule (100 mg total) by mouth 2 (two) times daily for 7 days. 07/04/18 07/11/18  Jene EveryKinner, Hayli Milligan, MD  QUEtiapine (SEROQUEL) 25 MG tablet Take 25 mg daily by mouth.    [provider]  traZODone (DESYREL) 50 MG tablet Take 50 mg at bedtime by mouth.  01/05/17   [provider]     Allergies Amoxicillin; Penicillins; and Sulfa antibiotics  No family history on file.  Social History Social History   Tobacco Use  . Smoking status: Never Smoker  . Smokeless tobacco: Never Used  Substance Use Topics  .  Alcohol use: No    Frequency: Never  . Drug use: No    Level 5 caveat: Unable to obtain review of Systems due to dementia     ____________________________________________   PHYSICAL EXAM:  VITAL SIGNS: ED Triage Vitals  Enc Vitals Group     BP 07/04/18 1200 (!) 118/104     Pulse --      Resp 07/04/18 1200 19     Temp --      Temp src --      SpO2 --      Weight 07/04/18 1148 49.9 kg (110 lb)     Height --      Head Circumference --      Peak Flow --      Pain Score 07/04/18 1439 0      Pain Loc --      Pain Edu? --      Excl. in GC? --     Constitutional: Alert, eyes open Eyes: Conjunctivae are normal.  Head: Atraumatic. Nose: No congestion/rhinnorhea. Mouth/Throat: Mucous membranes are moist.   Neck:  Painless ROM Cardiovascular: Normal rate, regular rhythm. Grossly normal heart sounds.  Good peripheral circulation. Respiratory: Normal respiratory effort.  No retractions. Lungs CTAB. Gastrointestinal: Soft and nontender. No distention.    Musculoskeletal: No lower extremity tenderness nor edema.  Warm and well perfused Neurologic:  . No gross focal neurologic deficits are appreciated.  Skin:  Skin is warm, dry and intact. No rash noted.   ____________________________________________   LABS (all labs ordered are listed, but only abnormal results are displayed)  Labs Reviewed  COMPREHENSIVE METABOLIC PANEL - Abnormal; Notable for the following components:      Result Value   Glucose, Bld 107 (*)    BUN 26 (*)    Total Bilirubin 1.7 (*)    All other components within normal limits  URINALYSIS, COMPLETE (UACMP) WITH MICROSCOPIC - Abnormal; Notable for the following components:   Color, Urine YELLOW (*)    APPearance HAZY (*)    Hgb urine dipstick SMALL (*)    Ketones, ur 20 (*)    Nitrite POSITIVE (*)    Bacteria, UA FEW (*)    All other components within normal limits  URINE CULTURE  CBC   ____________________________________________  EKG   ____________________________________________  RADIOLOGY  None ____________________________________________   PROCEDURES  Procedure(s) performed: No  Procedures   Critical Care performed: No ____________________________________________   INITIAL IMPRESSION / ASSESSMENT AND PLAN / ED COURSE  Pertinent labs & imaging results that were available during my care of the patient were reviewed by me and considered in my medical decision making (see chart for details).  Patient with a history of  Altheimer dementia presents with possibly decreased responsiveness today.  Review of records demonstrates that this is happened before with no etiology demonstrated.  Daughters have arrived and they report they wished that she had not been sent to the emergency department as she appears to be at her baseline currently.  In the past she has had a urinary tract infection with the symptoms so urine was checked which does demonstrate some nitrites, will send for culture and treat at this time.    ____________________________________________   FINAL CLINICAL IMPRESSION(S) / ED DIAGNOSES  Final diagnoses:  Generalized weakness  Lower urinary tract infectious disease        Note:  This document was prepared using Dragon voice recognition software and may include unintentional dictation errors.    Jene Every,  MD 07/05/18 1355

## 2018-07-07 LAB — URINE CULTURE: Culture: >=100000 — AB

## 2018-08-28 ENCOUNTER — Emergency Department: Payer: Medicare Other

## 2018-08-28 ENCOUNTER — Emergency Department
Admission: EM | Admit: 2018-08-28 | Discharge: 2018-08-28 | Disposition: A | Discharge status: Home / Self Care | Payer: Medicare Other | Attending: Emergency Medicine | Admitting: Emergency Medicine

## 2018-08-28 DIAGNOSIS — Y939 Activity, unspecified: Secondary | ICD-10-CM | POA: Diagnosis not present

## 2018-08-28 DIAGNOSIS — S61412A Laceration without foreign body of left hand, initial encounter: Secondary | ICD-10-CM | POA: Insufficient documentation

## 2018-08-28 DIAGNOSIS — S0990XA Unspecified injury of head, initial encounter: Secondary | ICD-10-CM | POA: Diagnosis present

## 2018-08-28 DIAGNOSIS — G309 Alzheimer's disease, unspecified: Secondary | ICD-10-CM | POA: Insufficient documentation

## 2018-08-28 DIAGNOSIS — W1830XA Fall on same level, unspecified, initial encounter: Secondary | ICD-10-CM | POA: Insufficient documentation

## 2018-08-28 DIAGNOSIS — Z79899 Other long term (current) drug therapy: Secondary | ICD-10-CM | POA: Diagnosis not present

## 2018-08-28 DIAGNOSIS — S0083XA Contusion of other part of head, initial encounter: Secondary | ICD-10-CM | POA: Diagnosis not present

## 2018-08-28 DIAGNOSIS — Y92129 Unspecified place in nursing home as the place of occurrence of the external cause: Secondary | ICD-10-CM | POA: Diagnosis not present

## 2018-08-28 DIAGNOSIS — Z23 Encounter for immunization: Secondary | ICD-10-CM | POA: Diagnosis not present

## 2018-08-28 DIAGNOSIS — Y999 Unspecified external cause status: Secondary | ICD-10-CM | POA: Diagnosis not present

## 2018-08-28 DIAGNOSIS — F0390 Unspecified dementia without behavioral disturbance: Secondary | ICD-10-CM | POA: Insufficient documentation

## 2018-08-28 DIAGNOSIS — W19XXXA Unspecified fall, initial encounter: Secondary | ICD-10-CM

## 2018-08-28 MED ORDER — LORAZEPAM 2 MG/ML IJ SOLN
INTRAMUSCULAR | Status: AC
  Filled 2018-08-28: qty 1

## 2018-08-28 MED ORDER — TETANUS-DIPHTH-ACELL PERTUSSIS 5-2.5-18.5 LF-MCG/0.5 IM SUSP
0.5000 mL | Freq: Once | INTRAMUSCULAR | Status: AC
  Administered 2018-08-28: 0.5 mL via INTRAMUSCULAR
  Filled 2018-08-28: qty 0.5

## 2018-08-28 MED ORDER — LORAZEPAM 2 MG/ML IJ SOLN
1.0000 mg | Freq: Once | INTRAMUSCULAR | Status: AC
  Administered 2018-08-28: 1 mg via INTRAVENOUS

## 2018-08-28 NOTE — ED Notes (Signed)
Parts of triage assessment not completed secondary to patients dementia.

## 2018-08-28 NOTE — Discharge Instructions (Addendum)
Please keep wound clean and dry.  Change dressing daily.  Be careful about falling again.  These follow-up with your regular doctor later this week and return for any further problems

## 2018-08-28 NOTE — ED Triage Notes (Signed)
Per [facility], pt fell about 10am today.  Reportedly had 3 seizures witnessed by staff at [facility].  Pt injured wrist and family request it be xrayed and no other treatment to be done.  Hx:  Alzheimers.  VS:  142/80, 74, 95% Room air

## 2018-08-28 NOTE — ED Provider Notes (Signed)
[facility] Emergency Department Provider Note   ____________________________________________   First MD Initiated Contact with Patient 08/28/18 1609     (approximate)  I have reviewed the triage vital signs and the nursing notes.   HISTORY  Chief Complaint Fall History limited by dementia and progressive a aphasia that the patient has   HPI Sydney Turner is a 77 y.o. female who comes from [facility].  Per EMS her family wants her wrists x-rayed but no other treatment.  She apparently fell and then had 3 seizures afterwards.  She has a history of Alzheimer's disease.  She does not have appear to have any injury to the right wrist she has a skin tear on the ulnar surface of the left hand MCP area.  Patient does not seem to have any pain in her head neck chest back belly or hips or legs.   Past Medical History:  Diagnosis Date  . Alzheimer's dementia    With Primary Progressive Aphasia  . Aphasia   . Dementia   . Dementia with behavioral disturbance   . Osteoporosis     Patient Active Problem List   Diagnosis Date Noted  . Sepsis secondary to UTI (HCC) 02/03/2017    No past surgical history on file.  Prior to Admission medications   Medication Sig Start Date End Date Taking? Authorizing Provider  acetaminophen (TYLENOL) 325 MG tablet Take 650 mg by mouth 3 (three) times daily.    Yes [provider]  acetaminophen (TYLENOL) 500 MG tablet Take 500 mg by mouth every 4 (four) hours as needed for mild pain or fever.   Yes [provider]  alum & mag hydroxide-simeth (MINTOX) 200-200-20 MG/5ML suspension Take 30 mLs by mouth daily as needed for indigestion or heartburn (max 4 doses per 24 hours).   Yes [provider]  azithromycin (ZITHROMAX) 250 MG tablet Take 250 mg by mouth daily. 08/28/18 08/31/18 Yes [provider]  Calcium Carb-Cholecalciferol (CALCIUM 600+D) 600-800 MG-UNIT TABS Take 1 tablet daily by  mouth.   Yes [provider]  Cholecalciferol (D3-1000 PO) Take 1,000 Units daily by mouth.   Yes [provider]  ciclopirox (PENLAC) 8 % solution Apply at bedtime topically. Apply over nail and surrounding skin. Apply daily over previous coat. After seven (7) days, may remove with alcohol and continue cycle.   Yes [provider]  docusate sodium (COLACE) 100 MG capsule Take 100 mg daily by mouth.   Yes [provider]  guaifenesin (ROBITUSSIN) 100 MG/5ML syrup Take 200 mg by mouth every 6 (six) hours as needed for cough ((max 4 doses in 24 hours)).    Yes [provider]  levothyroxine (SYNTHROID, LEVOTHROID) 112 MCG tablet Take 112 mcg by mouth daily before breakfast.   Yes [provider]  loperamide (IMODIUM) 2 MG capsule Take 2 mg by mouth as needed for diarrhea or loose stools (max 8 doses in 24 hours).    Yes [provider]  loratadine (CLARITIN) 10 MG tablet Take 10 mg daily by mouth.   Yes [provider]  LORazepam (ATIVAN) 0.5 MG tablet Take 0.25 mg 3 (three) times daily as needed by mouth (Agitation).   Yes [provider]  magnesium hydroxide (MILK OF MAGNESIA) 400 MG/5ML suspension Take 30 mLs by mouth at bedtime as needed for mild constipation.    Yes [provider]  Multiple Vitamin (MULTIVITAMIN) tablet Take 1 tablet daily by mouth.   Yes  [provider]  neomycin-bacitracin-polymyxin (NEOSPORIN) OINT Apply 1 application topically as needed for wound care.   Yes [provider]  predniSONE (DELTASONE) 10 MG tablet Take 10 mg by mouth daily. 08/27/18 08/31/18 Yes [provider]  QUEtiapine (SEROQUEL) 50 MG tablet Take 50 mg by mouth 2 (two) times daily.    Yes [provider]  nitrofurantoin (MACRODANTIN) 100 MG capsule Take 100 mg daily by mouth.    [provider]  traZODone (DESYREL) 50 MG tablet Take 50 mg at bedtime by mouth.  01/05/17    [provider]    Allergies Amoxicillin; Penicillins; and Sulfa antibiotics  No family history on file.  Social History Social History   Tobacco Use  . Smoking status: Never Smoker  . Smokeless tobacco: Never Used  Substance Use Topics  . Alcohol use: No    Frequency: Never  . Drug use: No    Review of Systems  Unable to obtain due to dementia  ____________________________________________   PHYSICAL EXAM:  VITAL SIGNS: ED Triage Vitals  Enc Vitals Group     BP 08/28/18 1556 124/68     Pulse Rate 08/28/18 1556 70     Resp 08/28/18 1556 16     Temp 08/28/18 1556 97.8 F (36.6 C)     Temp Source 08/28/18 1556 Axillary     SpO2 08/28/18 1556 97 %     Weight --      Height --      Head Circumference --      Peak Flow --      Pain Score 08/28/18 1602 0     Pain Loc --      Pain Edu? --      Excl. in GC? --     Constitutional: Alert  Well appearing and in no acute distress. Eyes: Conjunctivae are normal. PERRL. EOMI. Head: Small bruise and swelling just anterior to the left temple is apparently a bruise from falling today. Nose: No congestion/rhinnorhea. Mouth/Throat: Mucous membranes are moist.  Oropharynx non-erythematous. Neck: No stridor.   Cardiovascular: Normal rate, regular rhythm. Grossly normal heart sounds.  Good peripheral circulation. Respiratory: Normal respiratory effort.  No retractions. Lungs CTAB. Gastrointestinal: Soft and nontender. No distention. No abdominal bruits. No CVA tenderness. Musculoskeletal: No lower extremity tenderness nor edema.  Skin tear as noted in HPI on dorsal surface ulnar side left hand Neurologic:  Normal speech and language. No gross focal neurologic deficits are appreciated. No gait instability. Skin:  Skin is warm, dry and intact. No rash noted.   ____________________________________________   LABS (all labs ordered are listed, but only abnormal results are displayed)  Labs Reviewed - No data to  display ____________________________________________  EKG   ____________________________________________  RADIOLOGY  ED MD interpretation: X-rays of the wrists CT of the head and neck show no acute pathology.  Official radiology report(s): Dg Wrist Complete Left  Result Date: 08/28/2018 CLINICAL DATA:  Seizures.  Fall today at 10 a.m. EXAM: LEFT WRIST - COMPLETE 3+ VIEW COMPARISON:  None. FINDINGS: The navicular view could not be obtained as the patient was unable to cooperate with imaging. No appreciable fracture. Pronator fat pad appears normal. Mild degenerative spurring at the first carpometacarpal articulation. Bony demineralization. Flexed proximal interphalangeal joint and extended distal interphalangeal joint of the small finger, I cannot exclude boutonniere deformity. IMPRESSION: 1. No fracture identified. 2. Cannot exclude boutonniere deformity of the small finger, correlate with range of motion. 3. Degenerative findings at the first  carpometacarpal articulation. Electronically Signed   By: Gaylyn Rong M.D.   On: 08/28/2018 17:49   Dg Wrist Complete Right  Result Date: 08/28/2018 CLINICAL DATA:  Fall, seizure. EXAM: RIGHT WRIST - COMPLETE 3+ VIEW COMPARISON:  None. FINDINGS: The patient was unable to cooperate with imaging and a navicular view could not be obtained. No appreciable fracture or acute bony findings. IMPRESSION: 1.  No significant abnormality identified. Electronically Signed   By: Gaylyn Rong M.D.   On: 08/28/2018 17:50   Ct Head Wo Contrast  Result Date: 08/28/2018 CLINICAL DATA:  Unwitnessed fall EXAM: CT HEAD WITHOUT CONTRAST CT CERVICAL SPINE WITHOUT CONTRAST TECHNIQUE: Multidetector CT imaging of the head and cervical spine was performed following the standard protocol without intravenous contrast. Multiplanar CT image reconstructions of the cervical spine were also generated. COMPARISON:  Multiple exams, including 05/20/2018 and 09/06/2017  FINDINGS: CT HEAD FINDINGS Brain: Stable periventricular white matter hypodensities compatible with chronic ischemic microvascular white matter disease. Ex vacuo ventriculomegaly without flattening of the caudate heads or other signs of normal pressure hydrocephalus. No intracranial hemorrhage, mass lesion, or acute CVA. Vascular: There is atherosclerotic calcification of the cavernous carotid arteries bilaterally. Skull: Unremarkable Sinuses/Orbits: Small air-level in the right maxillary sinus favoring acute sinusitis. Other: No supplemental non-categorized findings. CT CERVICAL SPINE FINDINGS Alignment: No vertebral subluxation is observed. Skull base and vertebrae: Chronic anterior wedge compression fracture at the T3 vertebral level. Spurring anteriorly at the C1-2 articulation. Hemangioma eccentric to the left in the T1 vertebral body extending into the pedicle. Soft tissues and spinal canal: Unremarkable Disc levels: Mild left foraminal stenosis at C5-6 due to intervertebral and facet spurring. Upper chest: Unremarkable Other: No supplemental non-categorized findings. IMPRESSION: 1. No acute intracranial findings or acute cervical spine findings. 2. Periventricular white matter and corona radiata hypodensities favor chronic ischemic microvascular white matter disease. 3. Mild acute right maxillary sinusitis. 4. Old wedge compression fracture at T3. 5. Mild right foraminal impingement at C5-6 due to spurring. Electronically Signed   By: Gaylyn Rong M.D.   On: 08/28/2018 17:59   Ct Cervical Spine Wo Contrast  Result Date: 08/28/2018 CLINICAL DATA:  Unwitnessed fall EXAM: CT HEAD WITHOUT CONTRAST CT CERVICAL SPINE WITHOUT CONTRAST TECHNIQUE: Multidetector CT imaging of the head and cervical spine was performed following the standard protocol without intravenous contrast. Multiplanar CT image reconstructions of the cervical spine were also generated. COMPARISON:  Multiple exams, including 05/20/2018  and 09/06/2017 FINDINGS: CT HEAD FINDINGS Brain: Stable periventricular white matter hypodensities compatible with chronic ischemic microvascular white matter disease. Ex vacuo ventriculomegaly without flattening of the caudate heads or other signs of normal pressure hydrocephalus. No intracranial hemorrhage, mass lesion, or acute CVA. Vascular: There is atherosclerotic calcification of the cavernous carotid arteries bilaterally. Skull: Unremarkable Sinuses/Orbits: Small air-level in the right maxillary sinus favoring acute sinusitis. Other: No supplemental non-categorized findings. CT CERVICAL SPINE FINDINGS Alignment: No vertebral subluxation is observed. Skull base and vertebrae: Chronic anterior wedge compression fracture at the T3 vertebral level. Spurring anteriorly at the C1-2 articulation. Hemangioma eccentric to the left in the T1 vertebral body extending into the pedicle. Soft tissues and spinal canal: Unremarkable Disc levels: Mild left foraminal stenosis at C5-6 due to intervertebral and facet spurring. Upper chest: Unremarkable Other: No supplemental non-categorized findings. IMPRESSION: 1. No acute intracranial findings or acute cervical spine findings. 2. Periventricular white matter and corona radiata hypodensities favor chronic ischemic microvascular white matter disease. 3. Mild acute right maxillary sinusitis. 4. Old wedge compression  fracture at T3. 5. Mild right foraminal impingement at C5-6 due to spurring. Electronically Signed   By: Gaylyn Rong M.D.   On: 08/28/2018 17:59    ____________________________________________   PROCEDURES  Procedure(s) performed:   Procedures  Critical Care performed:   ____________________________________________   INITIAL IMPRESSION / ASSESSMENT AND PLAN / ED COURSE       Clinical Course as of Aug 28 1918  Sun Aug 28, 2018  4098 CT Cervical Spine Wo Contrast [PM]    Clinical Course User Index [PM] Arnaldo Natal, MD  Daughter  comes and sees the patient agrees to head CT such as a lump on the left side of her head.  Results are all negative.  We will give the patient a tetanus shot dressed the skin tear which has good wound edge approximation let her go.   ____________________________________________   FINAL CLINICAL IMPRESSION(S) / ED DIAGNOSES  Final diagnoses:  Fall, initial encounter  Skin tear of left hand without complication, initial encounter     ED Discharge Orders    None       Note:  This document was prepared using Dragon voice recognition software and may include unintentional dictation errors.    Arnaldo Natal, MD 08/28/18 1919

## 2018-08-28 NOTE — ED Notes (Signed)
Family at bedside, discussing plan of care with Dr. Darnelle Catalan.

## 2019-05-08 ENCOUNTER — Other Ambulatory Visit: Payer: Self-pay

## 2019-05-08 ENCOUNTER — Inpatient Hospital Stay
Admission: EM | Admit: 2019-05-08 | Discharge: 2019-05-15 | DRG: 469 | Disposition: A | Discharge status: Skilled Nursing Facility | Source: Skilled Nursing Facility | Attending: Internal Medicine | Admitting: Internal Medicine

## 2019-05-08 ENCOUNTER — Emergency Department

## 2019-05-08 DIAGNOSIS — Z6826 Body mass index (BMI) 26.0-26.9, adult: Secondary | ICD-10-CM

## 2019-05-08 DIAGNOSIS — S72009A Fracture of unspecified part of neck of unspecified femur, initial encounter for closed fracture: Secondary | ICD-10-CM

## 2019-05-08 DIAGNOSIS — E43 Unspecified severe protein-calorie malnutrition: Secondary | ICD-10-CM | POA: Diagnosis present

## 2019-05-08 DIAGNOSIS — Z20828 Contact with and (suspected) exposure to other viral communicable diseases: Secondary | ICD-10-CM | POA: Diagnosis present

## 2019-05-08 DIAGNOSIS — Z881 Allergy status to other antibiotic agents status: Secondary | ICD-10-CM | POA: Diagnosis not present

## 2019-05-08 DIAGNOSIS — Z79899 Other long term (current) drug therapy: Secondary | ICD-10-CM | POA: Diagnosis not present

## 2019-05-08 DIAGNOSIS — Z882 Allergy status to sulfonamides status: Secondary | ICD-10-CM | POA: Diagnosis not present

## 2019-05-08 DIAGNOSIS — Z66 Do not resuscitate: Secondary | ICD-10-CM | POA: Diagnosis present

## 2019-05-08 DIAGNOSIS — M810 Age-related osteoporosis without current pathological fracture: Secondary | ICD-10-CM | POA: Diagnosis present

## 2019-05-08 DIAGNOSIS — W1830XA Fall on same level, unspecified, initial encounter: Secondary | ICD-10-CM | POA: Diagnosis present

## 2019-05-08 DIAGNOSIS — F0281 Dementia in other diseases classified elsewhere with behavioral disturbance: Secondary | ICD-10-CM | POA: Diagnosis present

## 2019-05-08 DIAGNOSIS — M25512 Pain in left shoulder: Secondary | ICD-10-CM | POA: Diagnosis present

## 2019-05-08 DIAGNOSIS — S72002A Fracture of unspecified part of neck of left femur, initial encounter for closed fracture: Principal | ICD-10-CM | POA: Diagnosis present

## 2019-05-08 DIAGNOSIS — E039 Hypothyroidism, unspecified: Secondary | ICD-10-CM | POA: Diagnosis present

## 2019-05-08 DIAGNOSIS — Z791 Long term (current) use of non-steroidal anti-inflammatories (NSAID): Secondary | ICD-10-CM

## 2019-05-08 DIAGNOSIS — G309 Alzheimer's disease, unspecified: Secondary | ICD-10-CM | POA: Diagnosis present

## 2019-05-08 DIAGNOSIS — Z79890 Hormone replacement therapy (postmenopausal): Secondary | ICD-10-CM

## 2019-05-08 DIAGNOSIS — Z8249 Family history of ischemic heart disease and other diseases of the circulatory system: Secondary | ICD-10-CM

## 2019-05-08 DIAGNOSIS — R4701 Aphasia: Secondary | ICD-10-CM | POA: Diagnosis present

## 2019-05-08 LAB — CBC WITH DIFFERENTIAL/PLATELET
Abs Immature Granulocytes: 0.03 10*3/uL (ref 0.00–0.07)
Basophils Absolute: 0 10*3/uL (ref 0.0–0.1)
Basophils Relative: 0 %
Eosinophils Absolute: 0 10*3/uL (ref 0.0–0.5)
Eosinophils Relative: 1 %
HCT: 38.9 % (ref 36.0–46.0)
Hemoglobin: 12.3 g/dL (ref 12.0–15.0)
Immature Granulocytes: 0 %
Lymphocytes Relative: 5 %
Lymphs Abs: 0.4 10*3/uL — ABNORMAL LOW (ref 0.7–4.0)
MCH: 32.3 pg (ref 26.0–34.0)
MCHC: 31.6 g/dL (ref 30.0–36.0)
MCV: 102.1 fL — ABNORMAL HIGH (ref 80.0–100.0)
Monocytes Absolute: 0.8 10*3/uL (ref 0.1–1.0)
Monocytes Relative: 10 %
Neutro Abs: 6.4 10*3/uL (ref 1.7–7.7)
Neutrophils Relative %: 84 %
Platelets: 179 10*3/uL (ref 150–400)
RBC: 3.81 MIL/uL — ABNORMAL LOW (ref 3.87–5.11)
RDW: 14.1 % (ref 11.5–15.5)
WBC: 7.7 10*3/uL (ref 4.0–10.5)
nRBC: 0 % (ref 0.0–0.2)

## 2019-05-08 LAB — CK: Total CK: 109 U/L (ref 38–234)

## 2019-05-08 LAB — BASIC METABOLIC PANEL
Anion gap: 7 (ref 5–15)
BUN: 36 mg/dL — ABNORMAL HIGH (ref 8–23)
CO2: 29 mmol/L (ref 22–32)
Calcium: 10.6 mg/dL — ABNORMAL HIGH (ref 8.9–10.3)
Chloride: 105 mmol/L (ref 98–111)
Creatinine, Ser: 0.63 mg/dL (ref 0.44–1.00)
GFR calc Af Amer: >60 mL/min (ref >60)
GFR calc non Af Amer: >60 mL/min (ref >60)
Glucose, Bld: 157 mg/dL — ABNORMAL HIGH (ref 70–99)
Potassium: 4.1 mmol/L (ref 3.5–5.1)
Sodium: 141 mmol/L (ref 135–145)

## 2019-05-08 LAB — URINALYSIS, COMPLETE (UACMP) WITH MICROSCOPIC
Bilirubin Urine: NEGATIVE
Glucose, UA: NEGATIVE mg/dL
Ketones, ur: 5 mg/dL — AB
Leukocytes,Ua: NEGATIVE
Nitrite: NEGATIVE
Protein, ur: 30 mg/dL — AB
Specific Gravity, Urine: 1.024 (ref 1.005–1.030)
pH: 5 (ref 5.0–8.0)

## 2019-05-08 LAB — SAMPLE TO BLOOD BANK

## 2019-05-08 LAB — SURGICAL PCR SCREEN
MRSA, PCR: NEGATIVE
Staphylococcus aureus: NEGATIVE

## 2019-05-08 LAB — TYPE AND SCREEN
ABO/RH(D): O POS
Antibody Screen: NEGATIVE

## 2019-05-08 LAB — SARS CORONAVIRUS 2 BY RT PCR (HOSPITAL ORDER, PERFORMED IN ~~LOC~~ HOSPITAL LAB): SARS Coronavirus 2: NEGATIVE

## 2019-05-08 MED ORDER — LORATADINE 10 MG PO TABS
10.0000 mg | ORAL_TABLET | Freq: Every day | ORAL | Status: DC
  Administered 2019-05-10: 10 mg via ORAL
  Filled 2019-05-08: qty 1

## 2019-05-08 MED ORDER — DEXTROSE-NACL 5-0.45 % IV SOLN
INTRAVENOUS | Status: DC
  Administered 2019-05-08: 21:00:00 via INTRAVENOUS

## 2019-05-08 MED ORDER — VANCOMYCIN HCL IN DEXTROSE 1-5 GM/200ML-% IV SOLN
1000.0000 mg | Freq: Once | INTRAVENOUS | Status: AC
  Administered 2019-05-09: 1000 mg via INTRAVENOUS
  Filled 2019-05-08: qty 200

## 2019-05-08 MED ORDER — CALCIUM CARBONATE-VITAMIN D 500-200 MG-UNIT PO TABS
1.0000 | ORAL_TABLET | Freq: Every day | ORAL | Status: DC
  Administered 2019-05-10 – 2019-05-15 (×4): 1 via ORAL
  Filled 2019-05-08 (×3): qty 1

## 2019-05-08 MED ORDER — QUETIAPINE FUMARATE 25 MG PO TABS
50.0000 mg | ORAL_TABLET | Freq: Two times a day (BID) | ORAL | Status: DC
  Administered 2019-05-08 – 2019-05-10 (×3): 50 mg via ORAL
  Filled 2019-05-08 (×4): qty 2

## 2019-05-08 MED ORDER — MORPHINE SULFATE (PF) 2 MG/ML IV SOLN: 2.0000 mg | INTRAVENOUS | Status: DC | PRN

## 2019-05-08 MED ORDER — LORAZEPAM 0.5 MG PO TABS: 0.2500 mg | ORAL_TABLET | Freq: Three times a day (TID) | ORAL | Status: DC | PRN

## 2019-05-08 MED ORDER — ADULT MULTIVITAMIN W/MINERALS CH
1.0000 | ORAL_TABLET | Freq: Every day | ORAL | Status: DC
  Administered 2019-05-10: 1 via ORAL
  Filled 2019-05-08: qty 1

## 2019-05-08 MED ORDER — ACETAMINOPHEN 325 MG PO TABS
650.0000 mg | ORAL_TABLET | Freq: Four times a day (QID) | ORAL | Status: DC | PRN
  Administered 2019-05-10 – 2019-05-14 (×9): 650 mg via ORAL
  Filled 2019-05-08 (×9): qty 2

## 2019-05-08 MED ORDER — ONDANSETRON HCL 4 MG PO TABS: 4.0000 mg | ORAL_TABLET | Freq: Four times a day (QID) | ORAL | Status: DC | PRN

## 2019-05-08 MED ORDER — TRAZODONE HCL 50 MG PO TABS
50.0000 mg | ORAL_TABLET | Freq: Every day | ORAL | Status: DC
  Filled 2019-05-08: qty 1

## 2019-05-08 MED ORDER — VITAMIN D3 25 MCG (1000 UNIT) PO TABS
1000.0000 [IU] | ORAL_TABLET | Freq: Every day | ORAL | Status: DC
  Administered 2019-05-10 – 2019-05-15 (×4): 1000 [IU] via ORAL
  Filled 2019-05-08 (×10): qty 1

## 2019-05-08 MED ORDER — TRAMADOL HCL 50 MG PO TABS
50.0000 mg | ORAL_TABLET | Freq: Four times a day (QID) | ORAL | Status: DC | PRN
  Administered 2019-05-08: 50 mg via ORAL
  Filled 2019-05-08: qty 1

## 2019-05-08 MED ORDER — ONDANSETRON HCL 4 MG/2ML IJ SOLN
4.0000 mg | Freq: Four times a day (QID) | INTRAMUSCULAR | Status: DC | PRN
  Administered 2019-05-09: 4 mg via INTRAVENOUS

## 2019-05-08 MED ORDER — ACETAMINOPHEN 650 MG RE SUPP: 650.0000 mg | Freq: Four times a day (QID) | RECTAL | Status: DC | PRN

## 2019-05-08 MED ORDER — POLYETHYLENE GLYCOL 3350 17 G PO PACK: 17.0000 g | PACK | Freq: Every day | ORAL | Status: DC | PRN

## 2019-05-08 MED ORDER — LEVOTHYROXINE SODIUM 112 MCG PO TABS
112.0000 ug | ORAL_TABLET | Freq: Every day | ORAL | Status: DC
  Administered 2019-05-10 – 2019-05-15 (×6): 112 ug via ORAL
  Filled 2019-05-08 (×7): qty 1

## 2019-05-08 NOTE — ED Notes (Signed)
ED TO INPATIENT HANDOFF REPORT  ED Nurse Name and Phone #: Sydney Turner [phone]  S Name/Age/Gender Sydney Turner 78 y.o. female Room/Bed: ED18A/ED18A  Code Status   Code Status: Prior  Home/SNF/Other Nursing Home Disoriented X4 Is this baseline? yes  Triage Complete: Triage complete  Chief Complaint fall  Triage Note Patient from [place], c/o of witnessed mechanical fall on Saturday. Ambulates usually w/o assist. Visit by hospice Nurse noticed she would not walk today. Patient c/o of pain to left hip. Patient usually non verbal as per ems.    Allergies Allergies  Allergen Reactions  . Sulfa Antibiotics Swelling    Edema  . Amoxicillin     Has patient had a PCN reaction causing immediate rash, facial/tongue/throat swelling, SOB or lightheadedness with hypotension: Unknown Has patient had a PCN reaction causing severe rash involving mucus membranes or skin necrosis: Unknown Has patient had a PCN reaction that required hospitalization: Unknown Has patient had a PCN reaction occurring within the last 10 years: Unknown If all of the above answers are "NO", then may proceed with Cephalosporin use.   Marland Kitchen Penicillins Nausea And Vomiting    Has patient had a PCN reaction causing immediate rash, facial/tongue/throat swelling, SOB or lightheadedness with hypotension: Unknown Has patient had a PCN reaction causing severe rash involving mucus membranes or skin necrosis: Unknown Has patient had a PCN reaction that required hospitalization: Unknown Has patient had a PCN reaction occurring within the last 10 years: Unknown If all of the above answers are "NO", then may proceed with Cephalosporin use.     Level of Care/Admitting Diagnosis ED Disposition    ED Disposition Condition [place]: [place] [100120]  Level of Care: Med-Surg [16]  Covid Evaluation: Asymptomatic Screening Protocol (No Symptoms)  Diagnosis: Closed left hip fracture  [facility]) [[id number]]  Admitting Physician: Hyman Bible DODD [[id number]]  Attending Physician: Hyman Bible DODD [[id number]]  Estimated length of stay: past midnight tomorrow  Certification:: I certify this patient will need inpatient services for at least 2 midnights  PT Class (Do Not Modify): Inpatient [101]  PT Acc Code (Do Not Modify): Private [1]       B Medical/Surgery History Past Medical History:  Diagnosis Date  . 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 Aphasia   . Dementia ([place])   . Dementia with behavioral disturbance ([place])   . Osteoporosis    History reviewed. No pertinent surgical history.   A IV Location/Drains/Wounds Patient Lines/Drains/Airways Status   Active Line/Drains/Airways    Name:   Placement date:   Placement time:   Site:   Days:   Peripheral IV 05/08/19 Left Arm   05/08/19    1445    Arm   less than 1          Intake/Output Last 24 hours No intake or output data in the 24 hours ending 05/08/19 1829  Labs/Imaging Results for orders placed or performed during the hospital encounter of 05/08/19 (from the past 48 hour(s))  Sample to Blood Bank     Status: None   Collection Time: 05/08/19  2:45 PM  Result Value Ref Range   Blood Bank Specimen SAMPLE AVAILABLE FOR TESTING    Sample Expiration      05/11/2019,2359 Performed at [facility], [address] [place], [place] [address]   Type and screen [place]     Status: None   Collection  Time: 05/08/19  2:45 PM  Result Value Ref Range   ABO/RH(D) O POS    Antibody Screen NEG    Sample Expiration      05/11/2019,2359 Performed at [facility], [address] [place], [place] [address]   Basic metabolic panel     Status: Abnormal   Collection Time: 05/08/19  2:47 PM  Result Value Ref Range   Sodium 141 135 - 145 mmol/L   Potassium 4.1 3.5 - 5.1 mmol/L   Chloride 105 98 - 111 mmol/L   CO2 29 22 - 32 mmol/L   Glucose, Bld 157 (H)  70 - 99 mg/dL   BUN 36 (H) 8 - 23 mg/dL   Creatinine, Ser 1.910.63 0.44 - 1.00 mg/dL   Calcium 47.810.6 (H) 8.9 - 10.3 mg/dL   GFR calc non Af Amer >60 >60 mL/min   GFR calc Af Amer >60 >60 mL/min   Anion gap 7 5 - 15    Comment: Performed at [facility], [address] [place], [place] [address]  CBC with Differential     Status: Abnormal   Collection Time: 05/08/19  2:47 PM  Result Value Ref Range   WBC 7.7 4.0 - 10.5 K/uL   RBC 3.81 (L) 3.87 - 5.11 MIL/uL   Hemoglobin 12.3 12.0 - 15.0 g/dL   HCT 13.038.9 86.536.0 - 78.446.0 %   MCV 102.1 (H) 80.0 - 100.0 fL   MCH 32.3 26.0 - 34.0 pg   MCHC 31.6 30.0 - 36.0 g/dL   RDW 69.614.1 29.511.5 - 28.415.5 %   Platelets 179 150 - 400 K/uL   nRBC 0.0 0.0 - 0.2 %   Neutrophils Relative % 84 %   Neutro Abs 6.4 1.7 - 7.7 K/uL   Lymphocytes Relative 5 %   Lymphs Abs 0.4 (L) 0.7 - 4.0 K/uL   Monocytes Relative 10 %   Monocytes Absolute 0.8 0.1 - 1.0 K/uL   Eosinophils Relative 1 %   Eosinophils Absolute 0.0 0.0 - 0.5 K/uL   Basophils Relative 0 %   Basophils Absolute 0.0 0.0 - 0.1 K/uL   Immature Granulocytes 0 %   Abs Immature Granulocytes 0.03 0.00 - 0.07 K/uL    Comment: Performed at [facility], [address] [place], [place] [address]  [place]     Status: None   Collection Time: 05/08/19  2:47 PM  Result Value Ref Range   Total CK 109 38 - 234 U/L    Comment: Performed at [facility], [address] [place], [place] [address]  Urinalysis, Complete w Microscopic     Status: Abnormal   Collection Time: 05/08/19  3:56 PM  Result Value Ref Range   Color, Urine Sydney Turner (A) YELLOW    Comment: BIOCHEMICALS MAY BE AFFECTED BY COLOR   APPearance CLOUDY (A) CLEAR   Specific Gravity, Urine 1.024 1.005 - 1.030   pH 5.0 5.0 - 8.0   Glucose, UA NEGATIVE NEGATIVE mg/dL   Hgb urine dipstick MODERATE (A) NEGATIVE   Bilirubin Urine NEGATIVE NEGATIVE   Ketones, ur 5 (A) NEGATIVE mg/dL   Protein, ur 30 (A) NEGATIVE mg/dL   Nitrite NEGATIVE  NEGATIVE   Leukocytes,Ua NEGATIVE NEGATIVE   WBC, UA 0-5 0 - 5 WBC/hpf   Bacteria, UA FEW (A) NONE SEEN   Squamous Epithelial / LPF 0-5 0 - 5   Mucus PRESENT    Hyaline Casts, UA PRESENT     Comment: Performed at [facility],  [address] [place], [place] [address]   Ct Head Wo Contrast  Result Date: 05/08/2019 CLINICAL DATA:  Fall 2 days ago with left hip pain and no ambulation. EXAM: CT HEAD WITHOUT CONTRAST TECHNIQUE: Contiguous axial images were obtained from the base of the skull through the vertex without intravenous contrast. COMPARISON:  CT head 05/20/2018. FINDINGS: Brain: There is no evidence of acute intracranial hemorrhage, mass lesion, brain edema or extra-axial fluid collection. There is moderate atrophy with prominence of the ventricles and subarachnoid spaces. There is confluent low-density in the periventricular white matter which appears stable. There is no CT evidence of acute cortical infarction. Vascular: Prominent intracranial vascular calcifications. No hyperdense vessel identified. Skull: Negative for fracture or focal lesion. Sinuses/Orbits: Small mucous retention cyst in the left maxillary sinus. The visualized paranasal sinuses and mastoid air cells are otherwise clear. No orbital abnormalities are seen. Other: Degenerative changes are present at both temporomandibular joints. IMPRESSION: 1. Stable examination without acute findings. 2. Atrophy and chronic small vessel ischemic changes. Electronically Signed   By: Carey BullocksWilliam  Veazey M.D.   On: 05/08/2019 16:47   Dg Hip Unilat W Or Wo Pelvis 2-3 Views Left  Result Date: 05/08/2019 CLINICAL DATA:  Left hip deformity. EXAM: DG HIP (WITH OR WITHOUT PELVIS) 2-3V LEFT COMPARISON:  None. FINDINGS: Exam demonstrates a displaced left femoral neck fracture with superior displacement of the distal fragment resulting in mild coxa vera. Mild diffuse decreased bone mineralization. Mild degenerative change of the left hip.  IMPRESSION: Displaced left femoral neck fracture. Electronically Signed   By: Elberta Fortisaniel  Boyle M.D.   On: 05/08/2019 15:29    Pending Labs Unresulted Labs (From admission, onward)    Start     Ordered   05/08/19 1704  SARS Coronavirus 2 (CEPHEID - Performed in [facility] hospital lab), Hosp Order  (Asymptomatic Patients Labs)  Once,   STAT    Question:  Rule Out  Answer:  Yes   05/08/19 1703   Signed and Held  Basic metabolic panel  Tomorrow morning,   R     Signed and Held   Signed and Held  CBC  Tomorrow morning,   R     Signed and Held          Vitals/Pain Today's Vitals   05/08/19 1445 05/08/19 1540 05/08/19 1553 05/08/19 1612  BP:      Pulse:  89 89 84  Resp:      Temp: 97.9 F (36.6 C)     TempSrc: Oral     SpO2:  96% 98% 98%  Weight:      Height:        Isolation Precautions No active isolations  Medications Medications - No data to display  Mobility non-ambulatory High fall risk   Focused Assessments    R Recommendations: See Admitting Provider Note  Report given to:   Additional Notes: Patient baseline is ambulatory. Patient has fractured left hip at this time making her bed bound. Disoriented X4 is baseline. Daughter at bedside

## 2019-05-08 NOTE — Consult Note (Signed)
ORTHOPAEDIC CONSULTATION  REQUESTING PHYSICIAN: Mayo, Allyn KennerKaty Dodd, MD  Chief Complaint: Left femoral neck hip fracture  HPI: Sydney Turner is a 78 y.o. female with advanced dementia who sustained a fall on Saturday and [place] [facility].  Patient is ambulatory at baseline but since her fall has been relegated to a wheelchair and would not let anyone touch her left lower extremity.  She was sent to [facility] today where x-rays revealed a displaced left femoral neck hip fracture.  Orthopedics is consulted for management of her fracture.  Patient has advanced dementia and is unable to provide a history.  I spoke with the patient's daughter who is at the bedside in the emergency department.   Past Medical History:  Diagnosis Date  . 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 Aphasia   . Dementia ([facility])   . Dementia with behavioral disturbance ([facility])   . Osteoporosis    History reviewed. No pertinent surgical history. Social History   Socioeconomic History  . Marital status: Widowed    Spouse name: Not on file  . Number of children: Not on file  . Years of education: Not on file  . Highest education level: Not on file  Occupational History  . Not on file  Social Needs  . Financial resource strain: Not on file  . Food insecurity    Worry: Not on file    Inability: Not on file  . Transportation needs    Medical: Not on file    Non-medical: Not on file  Tobacco Use  . Smoking status: Never Smoker  . Smokeless tobacco: Never Used  Substance and Sexual Activity  . Alcohol use: No    Frequency: Never  . Drug use: No  . Sexual activity: Not on file  Lifestyle  . Physical activity    Days per week: Not on file    Minutes per session: Not on file  . Stress: Not on file  Relationships  . Social Musicianconnections    Talks on phone: Not on file    Gets together: Not on file    Attends religious service: Not on file    Active member of club  or organization: Not on file    Attends meetings of clubs or organizations: Not on file    Relationship status: Not on file  Other Topics Concern  . Not on file  Social History Narrative  . Not on file   History reviewed. No pertinent family history. Allergies  Allergen Reactions  . Sulfa Antibiotics Swelling    Edema  . Amoxicillin     Has patient had a PCN reaction causing immediate rash, facial/tongue/throat swelling, SOB or lightheadedness with hypotension: Unknown Has patient had a PCN reaction causing severe rash involving mucus membranes or skin necrosis: Unknown Has patient had a PCN reaction that required hospitalization: Unknown Has patient had a PCN reaction occurring within the last 10 years: Unknown If all of the above answers are "NO", then may proceed with Cephalosporin use.   Marland Kitchen. Penicillins Nausea And Vomiting    Has patient had a PCN reaction causing immediate rash, facial/tongue/throat swelling, SOB or lightheadedness with hypotension: Unknown Has patient had a PCN reaction causing severe rash involving mucus membranes or skin necrosis: Unknown Has patient had a PCN reaction that required hospitalization: Unknown Has patient had a PCN reaction occurring within the last 10 years: Unknown If all of the above answers are "NO", then may proceed with  Cephalosporin use.    Prior to Admission medications   Medication Sig Start Date End Date Taking? Authorizing Provider  Calcium Carb-Cholecalciferol (CALCIUM 600+D) 600-800 MG-UNIT TABS Take 1 tablet daily by mouth.   Yes [provider]  Cholecalciferol (D3-1000 PO) Take 1,000 Units daily by mouth.   Yes [provider]  ciclopirox (PENLAC) 8 % solution Apply at bedtime topically. Apply over nail and surrounding skin. Apply daily over previous coat. After seven (7) days, may remove with alcohol and continue cycle.   Yes [provider]  docusate sodium (COLACE) 100 MG capsule Take 100 mg daily by  mouth.   Yes [provider]  levothyroxine (SYNTHROID, LEVOTHROID) 112 MCG tablet Take 112 mcg by mouth daily before breakfast.   Yes [provider]  loratadine (CLARITIN) 10 MG tablet Take 10 mg daily by mouth.   Yes [provider]  LORazepam (ATIVAN) 0.5 MG tablet Take 0.25 mg 3 (three) times daily as needed by mouth (Agitation).   Yes [provider]  Multiple Vitamin (MULTIVITAMIN) tablet Take 1 tablet daily by mouth.   Yes [provider]  QUEtiapine (SEROQUEL) 50 MG tablet Take 50 mg by mouth 2 (two) times daily.    Yes [provider]  traZODone (DESYREL) 50 MG tablet Take 50 mg at bedtime by mouth.  01/05/17  Yes [provider]  acetaminophen (TYLENOL) 325 MG tablet Take 650 mg by mouth 3 (three) times daily.     [provider]  acetaminophen (TYLENOL) 500 MG tablet Take 500 mg by mouth every 4 (four) hours as needed for mild pain or fever.    [provider]  alum & mag hydroxide-simeth (MINTOX) 200-200-20 MG/5ML suspension Take 30 mLs by mouth daily as needed for indigestion or heartburn (max 4 doses per 24 hours).    [provider]  guaifenesin (ROBITUSSIN) 100 MG/5ML syrup Take 200 mg by mouth every 6 (six) hours as needed for cough ((max 4 doses in 24 hours)).     [provider]  loperamide (IMODIUM) 2 MG capsule Take 2 mg by mouth as needed for diarrhea or loose stools (max 8 doses in 24 hours).     [provider]  magnesium hydroxide (MILK OF MAGNESIA) 400 MG/5ML suspension Take 30 mLs by mouth at bedtime as needed for mild constipation.     [provider]  neomycin-bacitracin-polymyxin (NEOSPORIN) OINT Apply 1 application topically as needed for wound care.    [provider]  nitrofurantoin (MACRODANTIN) 100 MG capsule Take 100 mg daily by mouth.    [provider]   Ct Head Wo Contrast  Result Date: 05/08/2019 CLINICAL DATA:  Fall 2 days  ago with left hip pain and no ambulation. EXAM: CT HEAD WITHOUT CONTRAST TECHNIQUE: Contiguous axial images were obtained from the base of the skull through the vertex without intravenous contrast. COMPARISON:  CT head 05/20/2018. FINDINGS: Brain: There is no evidence of acute intracranial hemorrhage, mass lesion, brain edema or extra-axial fluid collection. There is moderate atrophy with prominence of the ventricles and subarachnoid spaces. There is confluent low-density in the periventricular white matter which appears stable. There is no CT evidence of acute cortical infarction. Vascular: Prominent intracranial vascular calcifications. No hyperdense vessel identified. Skull: Negative for fracture or focal lesion. Sinuses/Orbits: Small mucous retention cyst in the left maxillary sinus. The visualized paranasal sinuses and mastoid air cells are otherwise clear. No orbital abnormalities are seen. Other: Degenerative changes are present at both  temporomandibular joints. IMPRESSION: 1. Stable examination without acute findings. 2. Atrophy and chronic small vessel ischemic changes. Electronically Signed   By: Richardean Sale M.D.   On: 05/08/2019 16:47   Dg Hip Unilat W Or Wo Pelvis 2-3 Views Left  Result Date: 05/08/2019 CLINICAL DATA:  Left hip deformity. EXAM: DG HIP (WITH OR WITHOUT PELVIS) 2-3V LEFT COMPARISON:  None. FINDINGS: Exam demonstrates a displaced left femoral neck fracture with superior displacement of the distal fragment resulting in mild coxa vera. Mild diffuse decreased bone mineralization. Mild degenerative change of the left hip. IMPRESSION: Displaced left femoral neck fracture. Electronically Signed   By: Marin Olp M.D.   On: 05/08/2019 15:29    Positive ROS: All other systems have been reviewed and were otherwise negative with the exception of those mentioned in the HPI and as above.  Physical Exam: General: Sleeping, no acute distress  MUSCULOSKELETAL: Deferred until later.   X-rays demonstrate shortening and external rotation of the hip fracture.  Assessment: Displaced left femoral neck hip fracture  Plan: I had a long discussion with the patient's daughter who is with her in the ER.  She called one of her sisters who is on speaker phone during our conversation.  I explained to them the nature of the patient's fracture.  I explained to them in detail what the surgery would entail as well as the details of the postoperative course. I also discussed with them the risks and benefits of surgery. They understand risks include but are not limited to infection, bleeding requiring blood transfusion, nerve or blood vessel injury, joint stiffness or loss of motion, persistent pain, weakness or instability, leg length discrepancy, hip dislocation, fracture, hardware failure and the need for further surgery. Medical risks include but are not limited to DVT and pulmonary embolism, myocardial infarction, stroke, pneumonia, respiratory failure and death.  The patient's daughters understood these risks and wished to proceed.   I reviewed the hip radiographs and the patient's labs in preparation for this case.  She will be n.p.o. after midnight.  Patient surgery is scheduled for tomorrow late morning.  I have ordered a Foley catheter to be replaced.  I have also ordered vancomycin to be on-call to the OR.   Thornton Park, MD    05/08/2019 7:03 PM

## 2019-05-08 NOTE — Progress Notes (Signed)
ED visit made. pateint is currenlty follwed by [facility] at [facility] with a hospice diagnosis fo abnormal weight loss. She is a DNR code, with out of facility DNR in place in the facility cart. She was [facility] to [facility] ED today by her hospice RN for evaluation of left hip pain and left leg rotation. Patient had a witnessed fall at the facility on Saturday. At baseline patient is minimally verbal, but completely ambulatory with out and assisted device. Patient seen lying on to ED stretcher, daughter [name] present at bedside. Emotional support given, patient to have hip xray. Family may pursue surgery if offered. Hospital care team aware patient is currently followed by [facility]. Will continue to follow and update hospice team. Patient to xray at end of visit.  [name] BSN, RN, [facility] [phone]

## 2019-05-08 NOTE — ED Notes (Signed)
Daughter at bedside.

## 2019-05-08 NOTE — ED Notes (Signed)
Patient transported to X-ray 

## 2019-05-08 NOTE — ED Provider Notes (Signed)
[facility] Emergency Department Provider Note ____________________________________________   First MD Initiated Contact with Patient 05/08/19 1437     (approximate)  I have reviewed the triage vital signs and the nursing notes.   HISTORY  Chief Complaint Fall and Hip Pain  Level 5 caveat: History of present illness limited due to dementia  HPI Sydney Turner is a 78 y.o. female with PMH as noted below who presents with apparent left hip injury after a fall from standing height 2 days ago.  Initially facility staff believe that she did not have a significant injury but the patient has been leaning to the left side.  The patient is nonverbal at baseline, however she is normally ambulatory.  Past Medical History:  Diagnosis Date  . 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 Aphasia   . Dementia (HCC)   . Dementia with behavioral disturbance (HCC)   . Osteoporosis     Patient Active Problem List   Diagnosis Date Noted  . Sepsis secondary to UTI (HCC) 02/03/2017    History reviewed. No pertinent surgical history.  Prior to Admission medications   Medication Sig Start Date End Date Taking? Authorizing Provider  Calcium Carb-Cholecalciferol (CALCIUM 600+D) 600-800 MG-UNIT TABS Take 1 tablet daily by mouth.   Yes [provider]  Cholecalciferol (D3-1000 PO) Take 1,000 Units daily by mouth.   Yes [provider]  ciclopirox (PENLAC) 8 % solution Apply at bedtime topically. Apply over nail and surrounding skin. Apply daily over previous coat. After seven (7) days, may remove with alcohol and continue cycle.   Yes [provider]  docusate sodium (COLACE) 100 MG capsule Take 100 mg daily by mouth.   Yes [provider]  levothyroxine (SYNTHROID, LEVOTHROID) 112 MCG tablet Take 112 mcg by mouth daily before breakfast.   Yes [provider]  loratadine (CLARITIN) 10 MG tablet Take 10 mg  daily by mouth.   Yes [provider]  LORazepam (ATIVAN) 0.5 MG tablet Take 0.25 mg 3 (three) times daily as needed by mouth (Agitation).   Yes [provider]  Multiple Vitamin (MULTIVITAMIN) tablet Take 1 tablet daily by mouth.   Yes [provider]  QUEtiapine (SEROQUEL) 50 MG tablet Take 50 mg by mouth 2 (two) times daily.    Yes [provider]  traZODone (DESYREL) 50 MG tablet Take 50 mg at bedtime by mouth.  01/05/17  Yes [provider]  acetaminophen (TYLENOL) 325 MG tablet Take 650 mg by mouth 3 (three) times daily.     [provider]  acetaminophen (TYLENOL) 500 MG tablet Take 500 mg by mouth every 4 (four) hours as needed for mild pain or fever.    [provider]  alum & mag hydroxide-simeth (MINTOX) 200-200-20 MG/5ML suspension Take 30 mLs by mouth daily as needed for indigestion or heartburn (max 4 doses per 24 hours).    [provider]  guaifenesin (ROBITUSSIN) 100 MG/5ML syrup Take 200 mg by mouth every 6 (six) hours as needed for cough ((max 4 doses in 24 hours)).     [provider]  loperamide (IMODIUM) 2 MG capsule Take 2 mg by mouth as needed for diarrhea or loose stools (max 8 doses in 24 hours).     [provider]  magnesium hydroxide (MILK OF MAGNESIA) 400 MG/5ML suspension Take 30 mLs by mouth at bedtime as needed for mild constipation.     [provider]  neomycin-bacitracin-polymyxin (NEOSPORIN) OINT Apply 1 application topically as needed for wound care.    [provider]  nitrofurantoin (MACRODANTIN) 100 MG capsule Take 100 mg daily by mouth.    [provider]    Allergies Sulfa antibiotics, Amoxicillin, and Penicillins  History reviewed. No pertinent family history.  Social History Social History   Tobacco Use  . Smoking status: Never Smoker  . Smokeless tobacco: Never Used  Substance Use Topics  . Alcohol use: No    Frequency: Never  .  Drug use: No    Review of Systems Level 5 caveat: Unable to obtain review of systems due to dementia    ____________________________________________   PHYSICAL EXAM:  VITAL SIGNS: ED Triage Vitals  Enc Vitals Group     BP 05/08/19 1432 (!) 121/101     Pulse Rate 05/08/19 1432 93     Resp 05/08/19 1432 17     Temp --      Temp src --      SpO2 05/08/19 1432 94 %     Weight 05/08/19 1429 120 lb (54.4 kg)     Height 05/08/19 1429 4\' 8"  (1.422 m)     Head Circumference --      Peak Flow --      Pain Score --      Pain Loc --      Pain Edu? --      Excl. in GC? --     Constitutional: Alert, nonverbal.  Uncomfortable appearing. Eyes: Conjunctivae are normal.  Head: Atraumatic. Nose: No congestion/rhinnorhea. Mouth/Throat: Mucous membranes are moist.   Neck: Normal range of motion.  No midline cervical spinal tenderness. Cardiovascular: Normal rate, regular rhythm. Good peripheral circulation. Respiratory: Normal respiratory effort.  No retractions.  Gastrointestinal: No distention.  Musculoskeletal: No midline spinal tenderness.  Pain on range of motion of left hip.  Left lower extremity appears shortened and externally rotated.  2+ DP pulse. Neurologic: Motor intact in all extremities. Skin:  Skin is warm and dry. No rash noted. Psychiatric: Unable to assess.  ____________________________________________   LABS (all labs ordered are listed, but only abnormal results are displayed)  Labs Reviewed  BASIC METABOLIC PANEL - Abnormal; Notable for the following components:      Result Value   Glucose, Bld 157 (*)    BUN 36 (*)    Calcium 10.6 (*)    All other components within normal limits  CBC WITH DIFFERENTIAL/PLATELET - Abnormal; Notable for the following components:   RBC 3.81 (*)    MCV 102.1 (*)    Lymphs Abs 0.4 (*)    All other components within normal limits  URINALYSIS, COMPLETE (UACMP) WITH MICROSCOPIC - Abnormal; Notable for the following components:    Color, Urine AMBER (*)    APPearance CLOUDY (*)    Hgb urine dipstick MODERATE (*)    Ketones, ur 5 (*)    Protein, ur 30 (*)    Bacteria, UA FEW (*)    All other components within normal limits  SARS CORONAVIRUS 2 (HOSPITAL ORDER, PERFORMED IN [facility])  CK  SAMPLE TO BLOOD BANK  TYPE AND SCREEN   ____________________________________________  EKG  ED ECG REPORT I, Dionne BucySebastian Latyra Jaye, the attending physician, personally viewed and interpreted this ECG.  Date: 05/08/2019 EKG Time: 1433 Rate: 95 Rhythm: normal sinus rhythm QRS Axis: Left axis Intervals: Incomplete RBBB ST/T Wave abnormalities: normal Narrative Interpretation: no evidence of acute ischemia  ____________________________________________  RADIOLOGY  XR  L hip: Femoral neck fracture  ____________________________________________   PROCEDURES  Procedure(s) performed: No  Procedures  Critical Care performed: No ____________________________________________   INITIAL IMPRESSION / ASSESSMENT AND PLAN / ED COURSE  Pertinent labs & imaging results that were available during my care of the patient were reviewed by me and considered in my medical decision making (see chart for details).  78 year old female with PMH as noted above presents with apparent left hip injury from a fall from standing height 2 days ago.  The patient has dementia and is nonverbal at baseline, however is normally ambulatory.  She has shortening and external rotation of the left lower extremity and pain on range of motion of that hip.  Overall I suspect most likely left hip fracture.  We will obtain x-rays, labs, and reassess.  ----------------------------------------- 6:07 PM on 05/08/2019 -----------------------------------------  X-ray confirms left femoral neck fracture.  After discussion with the patient's daughter, the family is leaning towards surgical management since the patient normally is ambulatory.  I  also obtained a CT head and urinalysis since the daughter reports that the patient is somewhat more lethargic than she was prior to the fall.  I consulted Dr. Mack Guise from orthopedics who will come to evaluate the patient.  I signed the patient out to the hospitalist Dr. Leslye Peer for admission.  _________________________  [name] was evaluated in Emergency Department on 05/08/2019 for the symptoms described in the history of present illness. She was evaluated in the context of the global COVID-19 pandemic, which necessitated consideration that the patient might be at risk for infection with the SARS-CoV-2 virus that causes COVID-19. Institutional protocols and algorithms that pertain to the evaluation of patients at risk for COVID-19 are in a state of rapid change based on information released by regulatory bodies including the CDC and federal and state organizations. These policies and algorithms were followed during the patient's care in the ED.  ____________________________________________   FINAL CLINICAL IMPRESSION(S) / ED DIAGNOSES  Final diagnoses:  Closed fracture of neck of left femur, initial encounter ([place])      NEW MEDICATIONS STARTED DURING THIS VISIT:  New Prescriptions   No medications on file     Note:  This document was prepared using Dragon voice recognition software and may include unintentional dictation errors.    Arta Silence, MD 05/08/19 [phone]

## 2019-05-08 NOTE — Care Management (Signed)
Notified by Santiago Glad with [facility] that patient is open to hospice. Patient is a resident of [place].

## 2019-05-08 NOTE — H&P (Addendum)
[facility] [place] [facility]   PATIENT NAME: Sydney Turner Turner    MR#:  [id number]  DATE OF BIRTH:  [birth year]  DATE OF ADMISSION:  05/08/2019  PRIMARY CARE PHYSICIAN: [facility]   REQUESTING/REFERRING PHYSICIAN: Dionne BucySebastian Siadecki, MD  CHIEF COMPLAINT:   Chief Complaint  Patient presents with  . Fall  . Hip Pain    HISTORY OF PRESENT ILLNESS:  Sydney Turner Bagent  is a 78 y.o. female with a known history of Alzheimer's dementia and osteoporosis who presented from [facility] with left hip pain after a witnessed fall a couple of days ago.  At baseline, patient is able to walk without an assistive device.  However, since her fall she has primarily been in a wheelchair.  She would not let anyone touch her left leg.  Per daughter, she is not very verbal but she recognizes her family.  She does require some assistance with feeding.  In the ED, vitals and labs were unremarkable.  Left hip x-ray showed a displaced left femoral neck fracture.  CT head was unremarkable.  Hospitalists were called for admission.  PAST MEDICAL HISTORY:   Past Medical History:  Diagnosis Date  . 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 Aphasia   . Dementia (HCC)   . Dementia with behavioral disturbance (HCC)   . Osteoporosis     PAST SURGICAL HISTORY:  History reviewed. No pertinent surgical history.  SOCIAL HISTORY:   Social History   Tobacco Use  . Smoking status: Never Smoker  . Smokeless tobacco: Never Used  Substance Use Topics  . Alcohol use: No    Frequency: Never    FAMILY HISTORY:  Father-heart disease  DRUG ALLERGIES:   Allergies  Allergen Reactions  . Sulfa Antibiotics Swelling    Edema  . Amoxicillin     Has patient had a PCN reaction causing immediate rash, facial/tongue/throat swelling, SOB or lightheadedness with hypotension: Unknown Has patient had a PCN reaction causing severe rash involving mucus  membranes or skin necrosis: Unknown Has patient had a PCN reaction that required hospitalization: Unknown Has patient had a PCN reaction occurring within the last 10 years: Unknown If all of the above answers are "NO", then may proceed with Cephalosporin use.   Marland Kitchen. Penicillins Nausea And Vomiting    Has patient had a PCN reaction causing immediate rash, facial/tongue/throat swelling, SOB or lightheadedness with hypotension: Unknown Has patient had a PCN reaction causing severe rash involving mucus membranes or skin necrosis: Unknown Has patient had a PCN reaction that required hospitalization: Unknown Has patient had a PCN reaction occurring within the last 10 years: Unknown If all of the above answers are "NO", then may proceed with Cephalosporin use.     REVIEW OF SYSTEMS:   ROS -unable to obtain due to Alzheimer's dementia  MEDICATIONS AT HOME:   Prior to Admission medications   Medication Sig Start Date End Date Taking? Authorizing Provider  Calcium Carb-Cholecalciferol (CALCIUM 600+D) 600-800 MG-UNIT TABS Take 1 tablet daily by mouth.   Yes [provider]  Cholecalciferol (D3-1000 PO) Take 1,000 Units daily by mouth.   Yes [provider]  ciclopirox (PENLAC) 8 % solution Apply at bedtime topically. Apply over nail and surrounding skin. Apply daily over previous coat. After seven (7) days, may remove with alcohol and continue cycle.   Yes [provider]  docusate sodium (COLACE) 100 MG capsule Take 100 mg daily by mouth.  Yes [provider]  levothyroxine (SYNTHROID, LEVOTHROID) 112 MCG tablet Take 112 mcg by mouth daily before breakfast.   Yes [provider]  loratadine (CLARITIN) 10 MG tablet Take 10 mg daily by mouth.   Yes [provider]  LORazepam (ATIVAN) 0.5 MG tablet Take 0.25 mg 3 (three) times daily as needed by mouth (Agitation).   Yes [provider]  Multiple Vitamin (MULTIVITAMIN) tablet Take 1 tablet  daily by mouth.   Yes [provider]  QUEtiapine (SEROQUEL) 50 MG tablet Take 50 mg by mouth 2 (two) times daily.    Yes [provider]  traZODone (DESYREL) 50 MG tablet Take 50 mg at bedtime by mouth.  01/05/17  Yes [provider]  acetaminophen (TYLENOL) 325 MG tablet Take 650 mg by mouth 3 (three) times daily.     [provider]  acetaminophen (TYLENOL) 500 MG tablet Take 500 mg by mouth every 4 (four) hours as needed for mild pain or fever.    [provider]  alum & mag hydroxide-simeth (MINTOX) 200-200-20 MG/5ML suspension Take 30 mLs by mouth daily as needed for indigestion or heartburn (max 4 doses per 24 hours).    [provider]  guaifenesin (ROBITUSSIN) 100 MG/5ML syrup Take 200 mg by mouth every 6 (six) hours as needed for cough ((max 4 doses in 24 hours)).     [provider]  loperamide (IMODIUM) 2 MG capsule Take 2 mg by mouth as needed for diarrhea or loose stools (max 8 doses in 24 hours).     [provider]  magnesium hydroxide (MILK OF MAGNESIA) 400 MG/5ML suspension Take 30 mLs by mouth at bedtime as needed for mild constipation.     [provider]  neomycin-bacitracin-polymyxin (NEOSPORIN) OINT Apply 1 application topically as needed for wound care.    [provider]  nitrofurantoin (MACRODANTIN) 100 MG capsule Take 100 mg daily by mouth.    [provider]      VITAL SIGNS:  Blood pressure (!) 121/101, pulse 84, temperature 97.9 F (36.6 C), temperature source Oral, resp. rate 17, height 4\' 8"  (1.422 m), weight 54.4 kg, SpO2 98 %.  PHYSICAL EXAMINATION:  Physical Exam  GENERAL:  78 y.o.-year-old patient lying in the bed with no acute distress.  Sleeping but easily arousable.  Thin appearing. EYES: Pupils equal, round, reactive to light and accommodation. No scleral icterus. Extraocular muscles intact.  HEENT: Head atraumatic, normocephalic. Oropharynx and nasopharynx  clear.  NECK:  Supple, no jugular venous distention. No thyroid enlargement, no tenderness.  LUNGS: Normal breath sounds bilaterally, no wheezing, rales,rhonchi or crepitation. No use of accessory muscles of respiration.  CARDIOVASCULAR: RRR, S1, S2 normal. No murmurs, rubs, or gallops.  ABDOMEN: Soft, nontender, nondistended. Bowel sounds present. No organomegaly or mass.  EXTREMITIES: No pedal edema, cyanosis, or clubbing. + Left leg is shortened and externally rotated. NEUROLOGIC: Cranial nerves II through XII are intact.  Unable to assess muscle strength in the lower extremity secondary to pain.  Sensation intact. Gait not checked.  PSYCHIATRIC: The patient is alert and oriented only to self. SKIN: No obvious rash, lesion, or ulcer.   LABORATORY PANEL:   CBC Recent Labs  Lab 05/08/19 1447  WBC 7.7  HGB 12.3  HCT 38.9  PLT 179   ------------------------------------------------------------------------------------------------------------------  Chemistries  Recent Labs  Lab 05/08/19 1447  NA 141  K 4.1  CL 105  CO2 29  GLUCOSE 157*  BUN 36*  CREATININE 0.63  CALCIUM 10.6*   ------------------------------------------------------------------------------------------------------------------  Cardiac Enzymes No results for input(s): TROPONINI in the last 168 hours. ------------------------------------------------------------------------------------------------------------------  RADIOLOGY:  Ct Head Wo Contrast  Result Date: 05/08/2019 CLINICAL DATA:  Fall 2 days ago with left hip pain and no ambulation. EXAM: CT HEAD WITHOUT CONTRAST TECHNIQUE: Contiguous axial images were obtained from the base of the skull through the vertex without intravenous contrast. COMPARISON:  CT head 05/20/2018. FINDINGS: Brain: There is no evidence of acute intracranial hemorrhage, mass lesion, brain edema or extra-axial fluid collection. There is moderate atrophy with prominence of the ventricles  and subarachnoid spaces. There is confluent low-density in the periventricular white matter which appears stable. There is no CT evidence of acute cortical infarction. Vascular: Prominent intracranial vascular calcifications. No hyperdense vessel identified. Skull: Negative for fracture or focal lesion. Sinuses/Orbits: Small mucous retention cyst in the left maxillary sinus. The visualized paranasal sinuses and mastoid air cells are otherwise clear. No orbital abnormalities are seen. Other: Degenerative changes are present at both temporomandibular joints. IMPRESSION: 1. Stable examination without acute findings. 2. Atrophy and chronic small vessel ischemic changes. Electronically Signed   By: Carey BullocksWilliam  Veazey M.D.   On: 05/08/2019 16:47   Dg Hip Unilat W Or Wo Pelvis 2-3 Views Left  Result Date: 05/08/2019 CLINICAL DATA:  Left hip deformity. EXAM: DG HIP (WITH OR WITHOUT PELVIS) 2-3V LEFT COMPARISON:  None. FINDINGS: Exam demonstrates a displaced left femoral neck fracture with superior displacement of the distal fragment resulting in mild coxa vera. Mild diffuse decreased bone mineralization. Mild degenerative change of the left hip. IMPRESSION: Displaced left femoral neck fracture. Electronically Signed   By: Elberta Fortisaniel  Boyle M.D.   On: 05/08/2019 15:29      IMPRESSION AND PLAN:   Left hip fracture s/p mechanical fall -Left hip x-ray with displaced left femoral neck fracture -Orthopedic surgery to evaluate this evening -Will keep patient n.p.o. for now until evaluated by Ortho -Pain control -Gentle IV fluids -PT consult when appropriate  Hypothyroidism-stable -Continue home Synthroid  Alzheimer's dementia- stable -Continue home Ativan, Seroquel, and trazodone -Supportive care  Severe protein calorie malnutrition- patient is followed [facility] -Continue hospice at discharge  All the records are reviewed and case discussed with ED provider. Management plans discussed  with the patient, family and they are in agreement.  CODE STATUS: DNR  TOTAL TIME TAKING CARE OF THIS PATIENT: 45 minutes.    Jinny BlossomKaty D  M.D on 05/08/2019 at 5:38 PM  Between 7am to 6pm - Pager [phone]- [phone]  After 6pm go to [email or web address] - Social research officer, governmentpassword [email or web address] [email or web address]  [facility] [place] [facility]  Office  [phone]  CC: Primary care physician; [facility]   Note: This dictation was prepared with Dragon dictation along with smaller phrase technology. Any transcriptional errors that result from this process are unintentional.

## 2019-05-08 NOTE — ED Triage Notes (Signed)
Patient from [place], c/o of witnessed mechanical fall on Saturday. Ambulates usually w/o assist. Visit by hospice Nurse noticed she would not walk today. Patient c/o of pain to left hip. Patient usually non verbal as per ems.

## 2019-05-08 NOTE — Progress Notes (Signed)
Family Meeting Note  Advance Directive:yes  Today a meeting took place with the daughter.  Patient is unable to participate due to: alzheimer's dementia  The following clinical team members were present during this meeting:MD  The following were discussed:Patient's diagnosis: left hip fracture, Patient's progosis: Unable to determine and Goals for treatment: DNR  Additional follow-up to be provided: prn  Time spent during discussion:20 minutes  Evette Doffing, MD

## 2019-05-08 NOTE — ED Notes (Signed)
Pt asleep, daughter at bedside. Applesauce given to pts daughter to feed pt when she wakes up per request of daughter, pt is to have nothing after midnight.

## 2019-05-09 ENCOUNTER — Inpatient Hospital Stay

## 2019-05-09 ENCOUNTER — Encounter: Payer: Self-pay | Admitting: *Deleted

## 2019-05-09 ENCOUNTER — Inpatient Hospital Stay: Admitting: Certified Registered"

## 2019-05-09 ENCOUNTER — Encounter
Admission: EM | Disposition: A | Discharge status: Skilled Nursing Facility | Payer: Self-pay | Source: Skilled Nursing Facility | Attending: Internal Medicine

## 2019-05-09 HISTORY — PX: HIP ARTHROPLASTY: SHX981

## 2019-05-09 LAB — BASIC METABOLIC PANEL
Anion gap: 7 (ref 5–15)
BUN: 26 mg/dL — ABNORMAL HIGH (ref 8–23)
CO2: 26 mmol/L (ref 22–32)
Calcium: 8.8 mg/dL — ABNORMAL LOW (ref 8.9–10.3)
Chloride: 106 mmol/L (ref 98–111)
Creatinine, Ser: 0.45 mg/dL (ref 0.44–1.00)
GFR calc Af Amer: >60 mL/min (ref >60)
GFR calc non Af Amer: >60 mL/min (ref >60)
Glucose, Bld: 103 mg/dL — ABNORMAL HIGH (ref 70–99)
Potassium: 3.6 mmol/L (ref 3.5–5.1)
Sodium: 139 mmol/L (ref 135–145)

## 2019-05-09 LAB — CBC
HCT: 31.9 % — ABNORMAL LOW (ref 36.0–46.0)
Hemoglobin: 10.2 g/dL — ABNORMAL LOW (ref 12.0–15.0)
MCH: 32.3 pg (ref 26.0–34.0)
MCHC: 32 g/dL (ref 30.0–36.0)
MCV: 100.9 fL — ABNORMAL HIGH (ref 80.0–100.0)
Platelets: 156 10*3/uL (ref 150–400)
RBC: 3.16 MIL/uL — ABNORMAL LOW (ref 3.87–5.11)
RDW: 13.9 % (ref 11.5–15.5)
WBC: 5.7 10*3/uL (ref 4.0–10.5)
nRBC: 0 % (ref 0.0–0.2)

## 2019-05-09 LAB — PROTIME-INR
INR: 1 (ref 0.8–1.2)
Prothrombin Time: 13.4 seconds (ref 11.4–15.2)

## 2019-05-09 LAB — APTT: aPTT: 38 seconds — ABNORMAL HIGH (ref 24–36)

## 2019-05-09 SURGERY — HEMIARTHROPLASTY, HIP, DIRECT ANTERIOR APPROACH, FOR FRACTURE
Anesthesia: Monitor Anesthesia Care | Site: Hip | Laterality: Left

## 2019-05-09 MED ORDER — ONDANSETRON HCL 4 MG PO TABS: 4.0000 mg | ORAL_TABLET | Freq: Four times a day (QID) | ORAL | Status: DC | PRN

## 2019-05-09 MED ORDER — DOCUSATE SODIUM 100 MG PO CAPS
100.0000 mg | ORAL_CAPSULE | Freq: Two times a day (BID) | ORAL | Status: DC
  Administered 2019-05-09 – 2019-05-11 (×3): 100 mg via ORAL
  Filled 2019-05-09 (×3): qty 1

## 2019-05-09 MED ORDER — ACETAMINOPHEN 500 MG PO TABS
500.0000 mg | ORAL_TABLET | Freq: Four times a day (QID) | ORAL | Status: AC
  Administered 2019-05-09 – 2019-05-10 (×4): 500 mg via ORAL
  Filled 2019-05-09 (×4): qty 1

## 2019-05-09 MED ORDER — BUPIVACAINE HCL (PF) 0.5 % IJ SOLN
INTRAMUSCULAR | Status: DC | PRN
  Administered 2019-05-09: 55 mL
  Administered 2019-05-09: 2.6 mL

## 2019-05-09 MED ORDER — ONDANSETRON HCL 4 MG/2ML IJ SOLN
INTRAMUSCULAR | Status: AC
  Filled 2019-05-09: qty 2

## 2019-05-09 MED ORDER — NEOMYCIN-POLYMYXIN B GU 40-200000 IR SOLN
Status: AC
  Filled 2019-05-09: qty 20

## 2019-05-09 MED ORDER — VANCOMYCIN HCL IN DEXTROSE 1-5 GM/200ML-% IV SOLN
1000.0000 mg | Freq: Two times a day (BID) | INTRAVENOUS | Status: AC
  Administered 2019-05-10: 1000 mg via INTRAVENOUS
  Filled 2019-05-09: qty 200

## 2019-05-09 MED ORDER — LACTATED RINGERS IV SOLN
INTRAVENOUS | Status: DC | PRN
  Administered 2019-05-09: 12:00:00 via INTRAVENOUS

## 2019-05-09 MED ORDER — BUPIVACAINE HCL (PF) 0.5 % IJ SOLN
INTRAMUSCULAR | Status: AC
  Filled 2019-05-09: qty 10

## 2019-05-09 MED ORDER — HYDROCODONE-ACETAMINOPHEN 7.5-325 MG PO TABS: 1.0000 | ORAL_TABLET | ORAL | Status: DC | PRN

## 2019-05-09 MED ORDER — ALUM & MAG HYDROXIDE-SIMETH 200-200-20 MG/5ML PO SUSP: 30.0000 mL | ORAL | Status: DC | PRN

## 2019-05-09 MED ORDER — MORPHINE SULFATE (PF) 4 MG/ML IV SOLN: 0.5000 mg | INTRAVENOUS | Status: DC | PRN

## 2019-05-09 MED ORDER — ACETAMINOPHEN 10 MG/ML IV SOLN
INTRAVENOUS | Status: AC
  Filled 2019-05-09: qty 100

## 2019-05-09 MED ORDER — NEOMYCIN-POLYMYXIN B GU 40-200000 IR SOLN
Status: DC | PRN
  Administered 2019-05-09: 16 mL

## 2019-05-09 MED ORDER — PROPOFOL 10 MG/ML IV BOLUS
INTRAVENOUS | Status: AC
  Filled 2019-05-09: qty 40

## 2019-05-09 MED ORDER — ONDANSETRON HCL 4 MG/2ML IJ SOLN: 4.0000 mg | Freq: Four times a day (QID) | INTRAMUSCULAR | Status: DC | PRN

## 2019-05-09 MED ORDER — PROPOFOL 10 MG/ML IV BOLUS
INTRAVENOUS | Status: DC | PRN
  Administered 2019-05-09 (×3): 10 mg via INTRAVENOUS
  Administered 2019-05-09: 20 mg via INTRAVENOUS

## 2019-05-09 MED ORDER — FENTANYL CITRATE (PF) 100 MCG/2ML IJ SOLN
INTRAMUSCULAR | Status: AC
  Filled 2019-05-09: qty 2

## 2019-05-09 MED ORDER — EPHEDRINE SULFATE 50 MG/ML IJ SOLN
INTRAMUSCULAR | Status: AC
  Filled 2019-05-09: qty 1

## 2019-05-09 MED ORDER — ENOXAPARIN SODIUM 40 MG/0.4ML ~~LOC~~ SOLN
40.0000 mg | SUBCUTANEOUS | Status: DC
  Administered 2019-05-10 – 2019-05-15 (×6): 40 mg via SUBCUTANEOUS
  Filled 2019-05-09 (×6): qty 0.4

## 2019-05-09 MED ORDER — HYDROCODONE-ACETAMINOPHEN 5-325 MG PO TABS: 1.0000 | ORAL_TABLET | ORAL | Status: DC | PRN

## 2019-05-09 MED ORDER — SODIUM CHLORIDE 0.9 % IV SOLN
INTRAVENOUS | Status: DC | PRN
  Administered 2019-05-09: 50 ug/min via INTRAVENOUS

## 2019-05-09 MED ORDER — BISACODYL 10 MG RE SUPP
10.0000 mg | Freq: Every day | RECTAL | Status: DC | PRN
  Administered 2019-05-11: 10 mg via RECTAL
  Filled 2019-05-09: qty 1

## 2019-05-09 MED ORDER — MAGNESIUM CITRATE PO SOLN
1.0000 | Freq: Once | ORAL | Status: DC | PRN
  Filled 2019-05-09: qty 296

## 2019-05-09 MED ORDER — FERROUS SULFATE 325 (65 FE) MG PO TABS
325.0000 mg | ORAL_TABLET | Freq: Three times a day (TID) | ORAL | Status: DC
  Administered 2019-05-09 – 2019-05-11 (×4): 325 mg via ORAL
  Filled 2019-05-09 (×4): qty 1

## 2019-05-09 MED ORDER — METHOCARBAMOL 500 MG PO TABS: 500.0000 mg | ORAL_TABLET | Freq: Four times a day (QID) | ORAL | Status: DC | PRN

## 2019-05-09 MED ORDER — METHOCARBAMOL 1000 MG/10ML IJ SOLN
500.0000 mg | Freq: Four times a day (QID) | INTRAVENOUS | Status: DC | PRN
  Filled 2019-05-09: qty 5

## 2019-05-09 MED ORDER — PROPOFOL 500 MG/50ML IV EMUL
INTRAVENOUS | Status: DC | PRN
  Administered 2019-05-09: 25 ug/kg/min via INTRAVENOUS

## 2019-05-09 MED ORDER — POTASSIUM CHLORIDE IN NACL 20-0.9 MEQ/L-% IV SOLN
INTRAVENOUS | Status: DC
  Administered 2019-05-09 – 2019-05-11 (×3): via INTRAVENOUS
  Filled 2019-05-09 (×5): qty 1000

## 2019-05-09 MED ORDER — EPHEDRINE SULFATE 50 MG/ML IJ SOLN
INTRAMUSCULAR | Status: DC | PRN
  Administered 2019-05-09: 10 mg via INTRAVENOUS

## 2019-05-09 MED ORDER — LIDOCAINE HCL (PF) 2 % IJ SOLN
INTRAMUSCULAR | Status: AC
  Filled 2019-05-09: qty 10

## 2019-05-09 MED ORDER — PHENYLEPHRINE HCL (PRESSORS) 10 MG/ML IV SOLN
INTRAVENOUS | Status: DC | PRN
  Administered 2019-05-09 (×3): 100 ug via INTRAVENOUS

## 2019-05-09 MED ORDER — ACETAMINOPHEN 10 MG/ML IV SOLN
INTRAVENOUS | Status: DC | PRN
  Administered 2019-05-09: 810 mg via INTRAVENOUS

## 2019-05-09 SURGICAL SUPPLY — 59 items
BLADE SAGITTAL WIDE XTHICK NO (BLADE) ×3 IMPLANT
BLADE SURG SZ10 CARB STEEL (BLADE) ×3 IMPLANT
BNDG COHESIVE 4X5 TAN STRL (GAUZE/BANDAGES/DRESSINGS) ×3 IMPLANT
CANISTER SUCT 1200ML W/VALVE (MISCELLANEOUS) ×3 IMPLANT
CANISTER SUCT 3000ML PPV (MISCELLANEOUS) ×6 IMPLANT
COVER BACK TABLE REUSABLE LG (DRAPES) ×3 IMPLANT
COVER WAND RF STERILE (DRAPES) ×3 IMPLANT
DRAPE 3/4 80X56 (DRAPES) ×6 IMPLANT
DRAPE INCISE IOBAN 66X60 STRL (DRAPES) ×3 IMPLANT
DRAPE SHEET LG 3/4 BI-LAMINATE (DRAPES) ×2 IMPLANT
DRAPE SPLIT 6X30 W/TAPE (DRAPES) ×3 IMPLANT
DRAPE SURG 17X11 SM STRL (DRAPES) ×3 IMPLANT
DRSG OPSITE POSTOP 4X10 (GAUZE/BANDAGES/DRESSINGS) ×3 IMPLANT
DURAPREP 26ML APPLICATOR (WOUND CARE) ×12 IMPLANT
ELECT BLADE 6.5 EXT (BLADE) ×3 IMPLANT
ELECT CAUTERY BLADE 6.4 (BLADE) ×3 IMPLANT
ELECT REM PT RETURN 9FT ADLT (ELECTROSURGICAL) ×3
ELECTRODE REM PT RTRN 9FT ADLT (ELECTROSURGICAL) ×1 IMPLANT
GAUZE SPONGE 4X4 12PLY STRL (GAUZE/BANDAGES/DRESSINGS) ×3 IMPLANT
GAUZE XEROFORM 1X8 LF (GAUZE/BANDAGES/DRESSINGS) ×2 IMPLANT
GLOVE BIOGEL PI IND STRL 9 (GLOVE) ×1 IMPLANT
GLOVE BIOGEL PI INDICATOR 9 (GLOVE) ×2
GLOVE SURG 9.0 ORTHO LTXF (GLOVE) ×6 IMPLANT
GOWN STRL REUS TWL 2XL XL LVL4 (GOWN DISPOSABLE) ×3 IMPLANT
GOWN STRL REUS W/ TWL LRG LVL3 (GOWN DISPOSABLE) ×1 IMPLANT
GOWN STRL REUS W/TWL LRG LVL3 (GOWN DISPOSABLE) ×2
HEAD MODULAR ENDO (Orthopedic Implant) ×2 IMPLANT
HEAD UNPLR 44XMDLR STRL HIP (Orthopedic Implant) IMPLANT
HEMOVAC 400ML (MISCELLANEOUS) ×3
KIT DRAIN HEMOVAC JP 7FR 400ML (MISCELLANEOUS) ×1 IMPLANT
KIT TURNOVER KIT A (KITS) ×3 IMPLANT
NDL FILTER BLUNT 18X1 1/2 (NEEDLE) ×1 IMPLANT
NDL MAYO CATGUT SZ4 TPR NDL (NEEDLE) ×1 IMPLANT
NDL SAFETY ECLIPSE 18X1.5 (NEEDLE) ×1 IMPLANT
NEEDLE FILTER BLUNT 18X 1/2SAF (NEEDLE) ×2
NEEDLE FILTER BLUNT 18X1 1/2 (NEEDLE) ×1 IMPLANT
NEEDLE HYPO 18GX1.5 SHARP (NEEDLE) ×3
NEEDLE MAYO CATGUT SZ4 (NEEDLE) ×3 IMPLANT
NS IRRIG 1000ML POUR BTL (IV SOLUTION) ×3 IMPLANT
PACK HIP PROSTHESIS (MISCELLANEOUS) ×3 IMPLANT
PILLOW ABDUCTION FOAM SM (MISCELLANEOUS) ×2 IMPLANT
PULSAVAC PLUS IRRIG FAN TIP (DISPOSABLE) ×3
RETRIEVER SUT HEWSON (MISCELLANEOUS) ×3 IMPLANT
SLEEVE CABLE 2MM VT (Orthopedic Implant) ×2 IMPLANT
SLEEVE UNITRAX V40 (Orthopedic Implant) ×2 IMPLANT
SLEEVE UNITRAX V40 +8 (Orthopedic Implant) IMPLANT
SOL .9 NS 3000ML IRR  AL (IV SOLUTION) ×2
SOL .9 NS 3000ML IRR AL (IV SOLUTION) ×1
SOL .9 NS 3000ML IRR UROMATIC (IV SOLUTION) ×1 IMPLANT
STAPLER SKIN PROX 35W (STAPLE) ×3 IMPLANT
STEM FEM ACCOLADE 38X102X30 S3 (Stem) ×2 IMPLANT
SUT TICRON 2-0 30IN 311381 (SUTURE) ×12 IMPLANT
SUT VIC AB 0 CT1 36 (SUTURE) ×3 IMPLANT
SUT VIC AB 2-0 CT2 27 (SUTURE) ×6 IMPLANT
SYR 10ML LL (SYRINGE) ×3 IMPLANT
TAPE MICROFOAM 4IN (TAPE) ×3 IMPLANT
TAPE TRANSPORE STRL 2 31045 (GAUZE/BANDAGES/DRESSINGS) ×3 IMPLANT
TIP BRUSH PULSAVAC PLUS 24.33 (MISCELLANEOUS) ×3 IMPLANT
TIP FAN IRRIG PULSAVAC PLUS (DISPOSABLE) ×1 IMPLANT

## 2019-05-09 NOTE — Progress Notes (Signed)
Visit made. Patient currently out of the room for surgery, daughter [name] present. She is hopeful that her mother will be able to discharge to rehab, she did voice her understanding that she would revoke her hospice benefit if she is able to discharge to rehab. Emotional support provided. Will continue to follow and update hospice team. [name] BSN, RN, [facility] [phone]

## 2019-05-09 NOTE — Op Note (Signed)
05/09/2019  2:28 PM  PATIENT:  Sydney Turner   MRN: [id number]  PRE-OPERATIVE DIAGNOSIS: Displaced left femoral neck hip Fracture  POST-OPERATIVE DIAGNOSIS: Same  PROCEDURE:  Procedure(s): LEFT HIP HEMIARTHROPLASTY   PREOPERATIVE INDICATIONS:    FAUN MCQUEEN is an 78 y.o. female who was admitted with a diagnosis of Left Hip Fracture.  I have recommended surgical fixation with hemiarthroplasty for this injury. I have explained the surgery and the postoperative course to the patient's daughter who has agreed with surgical management of this fracture.    The risks benefits and alternatives were discussed with the patient and their family including but not limited to the risks of  infection requiring removal of the prosthesis, bleeding requiring blood transfusion, nerve injury especially to the sciatic nerve leading to foot drop or lower extremity numbness, periprosthetic fracture, dislocation leg length discrepancy, change in lower extremity rotation persistent hip pain, loosening or failure of the components and the need for revision surgery. Medical risks include but are not limited to DVT and pulmonary embolism, myocardial infarction, stroke, pneumonia, respiratory failure and death.  OPERATIVE REPORT     SURGEON:  Thornton Park, MD    ASSISTANT:  Surgical tech    ANESTHESIA:  Spinal    COMPLICATIONS:  None.   SPECIMEN: Femoral head to pathology    COMPONENTS:  [employer]ker Accolade 2 femoral component size 3 with a 44 mm +8 neck adjustment sleeve.  Dall-Miles cable x1    PROCEDURE IN DETAIL:   The patient was met in the holding area and  identified.  The patient's daughter was with her in the preoperative area.  The left hip was identified and marked at the operative site after verbally confirming with the patient that this was the correct site of surgery.  The patient was then transported to the OR  and  underwent spinal anesthesia.  The patient was then placed in the lateral  decubitus position with the left hip position and upwards and secured on the operating room table with a pegboard and all bony prominences were adequately padded. This included an axillary roll and additional padding around the nonoperative leg to prevent compression to the common peroneal nerve.    The operative lower extremity was prepped and draped in a sterile fashion.  A time out was performed prior to incision to verify patient's name, date of birth, medical record number, correct site of surgery correct procedure to be performed. The timeout was also used to verify the patient received antibiotics now appropriate instruments, implants and radiographic studies were available in the room. Once all in attendance were in agreement case began.    A posterolateral approach was utilized via sharp dissection  carried down to the subcutaneous tissue.  Bleeding vessels were coagulated using electrocautery.  The fascia lata was identified and incised along the length of the skin incision.  The gluteus maximus muscle was then split in line with its fibers. Self-retaining retractors were  inserted.  With the hip internally rotated, the short external rotators  were identified and removed from the posterior attachment from the greater trochanter. The piriformis was tagged for later repair. The capsule was identified and a T-shaped capsulotomy was performed. The capsule was tagged with #2 Tycron for later repair.  The femoral neck fracture was exposed, and the femoral head was removed using a corkscrew device. This was measured to be 44 mm in diameter. The attention was then turned to proximal femur preparation.  The patient's fracture extended  vertically into the medial calcar.  An oscillating saw was used to perform a proximal femoral osteotomy 1 fingerbreadth above the lesser trochanter. The trial 44 mm femoral head was placed into the acetabulum and had an excellent suction fit. The attention was then turned back  to femoral preparation.    A femoral skid and Cobra retractor were placed under the femoral neck to allow for adequate visualization. A box osteotome was used to make the initial entry into the proximal femur. A single hand reamer was used to prepare the femoral canal. A T-shaped femoral canal sounder was then used to ensure no penetration femoral cortex had occurred during reaming. The proximal femur was then sequentially broached by hand. A size 3 femoral trial broach was found to have best medial to lateral canal fit. Once adequate mediolateral canal fill was achieved the trial femoral broach, neck, and head was assembled and the hip was reduced. It was found to have excellent stability, equivalent leg lengths with functional range of motion. The trial components were then removed.  A single Dall-Miles cable was placed around the calcar of the proximal femur to prevent propagation of the vertical fracture in the calcar.  I then copiously irrigated the femoral canal and then impacted the actual Accolade 2 size 3 femoral prosthesis into place into the appropriate version, slightly anteverted to the normal anatomy, and I impacted the actual 44 mm Unitrax femoral component with a +8 neck adjustment sleeve into place. The hip was then reduced and taken through functional range of motion and found to have excellent stability. Leg lengths were restored. The hip joint was copiously irrigated.   A soft tissue repair of the capsule and external rotators was performed using #2 Tycron Excellent posterior capsular repair was achieved. The fascia lata was then closed with interrupted 0 Vicryl suture. The subcutaneous tissues were closed with 2-0 Vicryl and the skin approximated with staples.   The patient was then placed supine on the operative table. Leg lengths were checked clinically and found to be equivalent. An abduction pillow was placed between the lower extremities. The patient was then transferred to a  hospital bed and brought to the PACU in stable condition. I was scrubbed and present the entire case and all sharp and instrument counts were correct at the conclusion of the case. I spoke with the patient's daughter by phone from the PACU to let her know the case was completed without complication patient was stable in recovery room.   Timoteo Gaul, MD Orthopedic Surgeon

## 2019-05-09 NOTE — Progress Notes (Signed)
Subjective:  The patient is in the preoperative area with her daughter at the bedside.  Patient has advanced Alzheimer's dementia and is unable to provide a history.  She is currently in no acute distress.  Objective:   VITALS:   Vitals:   05/08/19 2042 05/08/19 2322 05/09/19 0749 05/09/19 1100  BP: 119/84 137/66 (!) 141/73 132/66  Pulse: 99 (!) 102 83 96  Resp: 18 18 16 16   Temp: 98.9 F (37.2 C)  98.1 F (36.7 C) 98.8 F (37.1 C)  TempSrc: Axillary   Oral  SpO2: 99% 94% 97% 97%  Weight:    54.4 kg  Height:    4\' 8"  (1.422 m)    PHYSICAL EXAM: Left lower extremity: Patient's skin overlying the left hip is intact.  She has shortening and external rotation of the left lower extremity.  Patient has palpable pedal pulses.  There is spontaneous movement to her toes and ankle.  Sensation cannot be assessed.  LABS  Results for orders placed or performed during the hospital encounter of 05/08/19 (from the past 24 hour(s))  Sample to Blood Bank     Status: None   Collection Time: 05/08/19  2:45 PM  Result Value Ref Range   Blood Bank Specimen SAMPLE AVAILABLE FOR TESTING    Sample Expiration      05/11/2019,2359 Performed at [facility], [address] [place], [place] [address]   Type and screen [facility]     Status: None   Collection Time: 05/08/19  2:45 PM  Result Value Ref Range   ABO/RH(D) O POS    Antibody Screen NEG    Sample Expiration      05/11/2019,2359 Performed at [facility], [address] [place], [place] [address]   Basic metabolic panel     Status: Abnormal   Collection Time: 05/08/19  2:47 PM  Result Value Ref Range   Sodium 141 135 - 145 mmol/L   Potassium 4.1 3.5 - 5.1 mmol/L   Chloride 105 98 - 111 mmol/L   CO2 29 22 - 32 mmol/L   Glucose, Bld 157 (H) 70 - 99 mg/dL   BUN 36 (H) 8 - 23 mg/dL   Creatinine, Ser 0.980.63 0.44 - 1.00 mg/dL   Calcium 11.910.6 (H) 8.9 - 10.3 mg/dL   GFR calc non Af Amer >60 >60  mL/min   GFR calc Af Amer >60 >60 mL/min   Anion gap 7 5 - 15  CBC with Differential     Status: Abnormal   Collection Time: 05/08/19  2:47 PM  Result Value Ref Range   WBC 7.7 4.0 - 10.5 K/uL   RBC 3.81 (L) 3.87 - 5.11 MIL/uL   Hemoglobin 12.3 12.0 - 15.0 g/dL   HCT 14.738.9 82.936.0 - 56.246.0 %   MCV 102.1 (H) 80.0 - 100.0 fL   MCH 32.3 26.0 - 34.0 pg   MCHC 31.6 30.0 - 36.0 g/dL   RDW 13.014.1 86.511.5 - 78.415.5 %   Platelets 179 150 - 400 K/uL   nRBC 0.0 0.0 - 0.2 %   Neutrophils Relative % 84 %   Neutro Abs 6.4 1.7 - 7.7 K/uL   Lymphocytes Relative 5 %   Lymphs Abs 0.4 (L) 0.7 - 4.0 K/uL   Monocytes Relative 10 %   Monocytes Absolute 0.8 0.1 - 1.0 K/uL   Eosinophils Relative 1 %   Eosinophils Absolute 0.0 0.0 - 0.5 K/uL   Basophils Relative 0 %  Basophils Absolute 0.0 0.0 - 0.1 K/uL   Immature Granulocytes 0 %   Abs Immature Granulocytes 0.03 0.00 - 0.07 K/uL  CK     Status: None   Collection Time: 05/08/19  2:47 PM  Result Value Ref Range   Total CK 109 38 - 234 U/L  Urinalysis, Complete w Microscopic     Status: Abnormal   Collection Time: 05/08/19  3:56 PM  Result Value Ref Range   Color, Urine AMBER (A) YELLOW   APPearance CLOUDY (A) CLEAR   Specific Gravity, Urine 1.024 1.005 - 1.030   pH 5.0 5.0 - 8.0   Glucose, UA NEGATIVE NEGATIVE mg/dL   Hgb urine dipstick MODERATE (A) NEGATIVE   Bilirubin Urine NEGATIVE NEGATIVE   Ketones, ur 5 (A) NEGATIVE mg/dL   Protein, ur 30 (A) NEGATIVE mg/dL   Nitrite NEGATIVE NEGATIVE   Leukocytes,Ua NEGATIVE NEGATIVE   WBC, UA 0-5 0 - 5 WBC/hpf   Bacteria, UA FEW (A) NONE SEEN   Squamous Epithelial / LPF 0-5 0 - 5   Mucus PRESENT    Hyaline Casts, UA PRESENT   SARS Coronavirus 2 (CEPHEID - Performed in [place] hospital lab), Hosp Order     Status: None   Collection Time: 05/08/19  5:04 PM   Specimen: Nasopharyngeal Swab  Result Value Ref Range   SARS Coronavirus 2 NEGATIVE NEGATIVE  Surgical pcr screen     Status: None   Collection  Time: 05/08/19  8:40 PM   Specimen: Nasal Mucosa; Nasal Swab  Result Value Ref Range   MRSA, PCR NEGATIVE NEGATIVE   Staphylococcus aureus NEGATIVE NEGATIVE  Basic metabolic panel     Status: Abnormal   Collection Time: 05/09/19  5:34 AM  Result Value Ref Range   Sodium 139 135 - 145 mmol/L   Potassium 3.6 3.5 - 5.1 mmol/L   Chloride 106 98 - 111 mmol/L   CO2 26 22 - 32 mmol/L   Glucose, Bld 103 (H) 70 - 99 mg/dL   BUN 26 (H) 8 - 23 mg/dL   Creatinine, Ser 0.45 0.44 - 1.00 mg/dL   Calcium 8.8 (L) 8.9 - 10.3 mg/dL   GFR calc non Af Amer >60 >60 mL/min   GFR calc Af Amer >60 >60 mL/min   Anion gap 7 5 - 15  CBC     Status: Abnormal   Collection Time: 05/09/19  5:34 AM  Result Value Ref Range   WBC 5.7 4.0 - 10.5 K/uL   RBC 3.16 (L) 3.87 - 5.11 MIL/uL   Hemoglobin 10.2 (L) 12.0 - 15.0 g/dL   HCT 31.9 (L) 36.0 - 46.0 %   MCV 100.9 (H) 80.0 - 100.0 fL   MCH 32.3 26.0 - 34.0 pg   MCHC 32.0 30.0 - 36.0 g/dL   RDW 13.9 11.5 - 15.5 %   Platelets 156 150 - 400 K/uL   nRBC 0.0 0.0 - 0.2 %  Protime-INR     Status: None   Collection Time: 05/09/19  5:34 AM  Result Value Ref Range   Prothrombin Time 13.4 11.4 - 15.2 seconds   INR 1.0 0.8 - 1.2  APTT     Status: Abnormal   Collection Time: 05/09/19  5:34 AM  Result Value Ref Range   aPTT 38 (H) 24 - 36 seconds    Ct Head Wo Contrast  Result Date: 05/08/2019 CLINICAL DATA:  Fall 2 days ago with left hip pain and no ambulation. EXAM: CT HEAD WITHOUT  CONTRAST TECHNIQUE: Contiguous axial images were obtained from the base of the skull through the vertex without intravenous contrast. COMPARISON:  CT head 05/20/2018. FINDINGS: Brain: There is no evidence of acute intracranial hemorrhage, mass lesion, brain edema or extra-axial fluid collection. There is moderate atrophy with prominence of the ventricles and subarachnoid spaces. There is confluent low-density in the periventricular white matter which appears stable. There is no CT evidence of  acute cortical infarction. Vascular: Prominent intracranial vascular calcifications. No hyperdense vessel identified. Skull: Negative for fracture or focal lesion. Sinuses/Orbits: Small mucous retention cyst in the left maxillary sinus. The visualized paranasal sinuses and mastoid air cells are otherwise clear. No orbital abnormalities are seen. Other: Degenerative changes are present at both temporomandibular joints. IMPRESSION: 1. Stable examination without acute findings. 2. Atrophy and chronic small vessel ischemic changes. Electronically Signed   By: Carey BullocksWilliam  Veazey M.D.   On: 05/08/2019 16:47   Dg Hip Unilat W Or Wo Pelvis 2-3 Views Left  Result Date: 05/08/2019 CLINICAL DATA:  Left hip deformity. EXAM: DG HIP (WITH OR WITHOUT PELVIS) 2-3V LEFT COMPARISON:  None. FINDINGS: Exam demonstrates a displaced left femoral neck fracture with superior displacement of the distal fragment resulting in mild coxa vera. Mild diffuse decreased bone mineralization. Mild degenerative change of the left hip. IMPRESSION: Displaced left femoral neck fracture. Electronically Signed   By: Elberta Fortisaniel  Boyle M.D.   On: 05/08/2019 15:29    Assessment/Plan: Day of Surgery   Active Problems:   Closed left hip fracture [facility](HCC)  Patient is scheduled for a left hip hemiarthroplasty.  I answered all final questions by her daughter at the bedside.  I have reviewed in detail the surgical procedure and postoperative course as well as the potential complications with the patient's daughter and her sister last night from the emergency room.  They are in agreement with the plan for surgery.    Juanell FairlyKevin Karrington Studnicka , MD 05/09/2019, 11:26 AM

## 2019-05-09 NOTE — Transfer of Care (Signed)
Immediate Anesthesia Transfer of Care Note  Patient: Sydney Turner  Procedure(s) Performed: ARTHROPLASTY BIPOLAR HIP (HEMIARTHROPLASTY) (Left Hip)  Patient Location: PACU  Anesthesia Type:MAC and Spinal  Level of Consciousness: awake  Airway & Oxygen Therapy: Patient Spontanous Breathing and Patient connected to face mask oxygen  Post-op Assessment: Patient moving all extremities  Post vital signs: Reviewed and stable  Last Vitals:  Vitals Value Taken Time  BP 119/61 05/09/19 1410  Temp 35.9 C 05/09/19 1410  Pulse 68 05/09/19 1420  Resp 11 05/09/19 1414  SpO2 99 % 05/09/19 1420  Vitals shown include unvalidated device data.  Last Pain:  Vitals:   05/09/19 1100  TempSrc: Oral  PainSc: 0-No pain         Complications: No apparent anesthesia complications

## 2019-05-09 NOTE — Progress Notes (Signed)
Subjective:  POST-OP CHECK: Patient has advanced dementia and is unable to provide a history.  Her daughter is at the bedside.  Patient sitting in her bed and appears comfortable.     Objective:   VITALS:   Vitals:   05/09/19 1510 05/09/19 1525 05/09/19 1540 05/09/19 1640  BP: (!) 127/53 (!) 116/59 (!) 129/54 125/68  Pulse: 72 75 72 75  Resp: 11 13 10    Temp:   (!) 97.2 F (36.2 C) (!) 97.3 F (36.3 C)  TempSrc:    Axillary  SpO2: 97% 97% 98% 97%  Weight:      Height:        PHYSICAL EXAM: Left lower extremity Intact pulses distally Incision: no drainage No cellulitis present Compartment soft  LABS  Results for orders placed or performed during the hospital encounter of 05/08/19 (from the past 24 hour(s))  Surgical pcr screen     Status: None   Collection Time: 05/08/19  8:40 PM   Specimen: Nasal Mucosa; Nasal Swab  Result Value Ref Range   MRSA, PCR NEGATIVE NEGATIVE   Staphylococcus aureus NEGATIVE NEGATIVE  Basic metabolic panel     Status: Abnormal   Collection Time: 05/09/19  5:34 AM  Result Value Ref Range   Sodium 139 135 - 145 mmol/L   Potassium 3.6 3.5 - 5.1 mmol/L   Chloride 106 98 - 111 mmol/L   CO2 26 22 - 32 mmol/L   Glucose, Bld 103 (H) 70 - 99 mg/dL   BUN 26 (H) 8 - 23 mg/dL   Creatinine, Ser 0.45 0.44 - 1.00 mg/dL   Calcium 8.8 (L) 8.9 - 10.3 mg/dL   GFR calc non Af Amer >60 >60 mL/min   GFR calc Af Amer >60 >60 mL/min   Anion gap 7 5 - 15  CBC     Status: Abnormal   Collection Time: 05/09/19  5:34 AM  Result Value Ref Range   WBC 5.7 4.0 - 10.5 K/uL   RBC 3.16 (L) 3.87 - 5.11 MIL/uL   Hemoglobin 10.2 (L) 12.0 - 15.0 g/dL   HCT 31.9 (L) 36.0 - 46.0 %   MCV 100.9 (H) 80.0 - 100.0 fL   MCH 32.3 26.0 - 34.0 pg   MCHC 32.0 30.0 - 36.0 g/dL   RDW 13.9 11.5 - 15.5 %   Platelets 156 150 - 400 K/uL   nRBC 0.0 0.0 - 0.2 %  Protime-INR     Status: None   Collection Time: 05/09/19  5:34 AM  Result Value Ref Range   Prothrombin Time 13.4 11.4 -  15.2 seconds   INR 1.0 0.8 - 1.2  APTT     Status: Abnormal   Collection Time: 05/09/19  5:34 AM  Result Value Ref Range   aPTT 38 (H) 24 - 36 seconds    Ct Head Wo Contrast  Result Date: 05/08/2019 CLINICAL DATA:  Fall 2 days ago with left hip pain and no ambulation. EXAM: CT HEAD WITHOUT CONTRAST TECHNIQUE: Contiguous axial images were obtained from the base of the skull through the vertex without intravenous contrast. COMPARISON:  CT head 05/20/2018. FINDINGS: Brain: There is no evidence of acute intracranial hemorrhage, mass lesion, brain edema or extra-axial fluid collection. There is moderate atrophy with prominence of the ventricles and subarachnoid spaces. There is confluent low-density in the periventricular white matter which appears stable. There is no CT evidence of acute cortical infarction. Vascular: Prominent intracranial vascular calcifications. No hyperdense vessel identified. Skull: Negative for  fracture or focal lesion. Sinuses/Orbits: Small mucous retention cyst in the left maxillary sinus. The visualized paranasal sinuses and mastoid air cells are otherwise clear. No orbital abnormalities are seen. Other: Degenerative changes are present at both temporomandibular joints. IMPRESSION: 1. Stable examination without acute findings. 2. Atrophy and chronic small vessel ischemic changes. Electronically Signed   By: Carey BullocksWilliam  Veazey M.D.   On: 05/08/2019 16:47   Dg Hip Port Unilat With Pelvis 1v Left  Result Date: 05/09/2019 CLINICAL DATA:  Left proximal femur fracture. Status post left hip hemiarthroplasty. EXAM: DG HIP (WITH OR WITHOUT PELVIS) 1V PORT LEFT COMPARISON:  Radiographs 05/08/2019 FINDINGS: AP and lateral images demonstrate that the proximal left femoral prosthesis appears in good position. Cerclage wire in place around the intertrochanteric region. The visualized pelvic bones appear normal. IMPRESSION: Satisfactory appearance of the left hip in the AP and lateral projections  after left hip hemiarthroplasty. Electronically Signed   By: Francene BoyersJames  Maxwell M.D.   On: 05/09/2019 15:17   Dg Hip Unilat W Or Wo Pelvis 2-3 Views Left  Result Date: 05/08/2019 CLINICAL DATA:  Left hip deformity. EXAM: DG HIP (WITH OR WITHOUT PELVIS) 2-3V LEFT COMPARISON:  None. FINDINGS: Exam demonstrates a displaced left femoral neck fracture with superior displacement of the distal fragment resulting in mild coxa vera. Mild diffuse decreased bone mineralization. Mild degenerative change of the left hip. IMPRESSION: Displaced left femoral neck fracture. Electronically Signed   By: Elberta Fortisaniel  Boyle M.D.   On: 05/08/2019 15:29    Assessment/Plan: Day of Surgery   Active Problems:   Closed left hip fracture [facility](HCC)  Patient is stable postop.  I reviewed the postop x-rays which show the hemi-arthroplasty prosthesis is well-positioned.  Patient's daughter explains that the patient has seemed to have pain in the left shoulder.  X-rays of the left shoulder have been ordered to be performed at the bedside.  Patient will complete 24 hours of postop antibiotics.  She is given vancomycin due to her penicillin allergy.  Patient will have her Foley catheter removed in the morning and will begin physical therapy.  She will start Lovenox for DVT prophylaxis tomorrow.    Juanell FairlyKevin Hasina Kreager , MD 05/09/2019, 5:18 PM

## 2019-05-09 NOTE — Anesthesia Post-op Follow-up Note (Signed)
Anesthesia QCDR form completed.        

## 2019-05-09 NOTE — Anesthesia Procedure Notes (Addendum)
Spinal  Patient location during procedure: OR Start time: 05/09/2019 12:05 PM End time: 05/09/2019 12:11 PM Staffing Anesthesiologist: Molli Barrows, MD Resident/CRNA: Yehya Brendle, Dierdre Forth, CRNA Performed: resident/CRNA  Preanesthetic Checklist Completed: patient identified, site marked, surgical consent, pre-op evaluation, timeout performed, IV checked, risks and benefits discussed and monitors and equipment checked Spinal Block Patient position: right lateral decubitus Prep: ChloraPrep, site prepped and draped and DuraPrep Patient monitoring: heart rate, cardiac monitor, continuous pulse ox and blood pressure Approach: midline Location: L4-5 Injection technique: single-shot Needle Needle type: Pencan  Needle gauge: 24 G Needle length: 10 cm Assessment Sensory level: unable to access as pt is nonverbal with dementia

## 2019-05-09 NOTE — Progress Notes (Signed)
[place] [facility] [place] NAME: Sydney Turner    MR#:  [id number]  DATE OF BIRTH:  19-May-1941  SUBJECTIVE:  patient came in from [place] after a fall and hip pain. She was seen in PACU. Per RN surgery went well patient has advanced Alzheimer's dementia does not verbalize much REVIEW OF SYSTEMS:   Review of Systems  Unable to perform ROS: Dementia     DRUG ALLERGIES:   Allergies  Allergen Reactions  . Sulfa Antibiotics Swelling    Edema  . Amoxicillin     Has patient had a PCN reaction causing immediate rash, facial/tongue/throat swelling, SOB or lightheadedness with hypotension: Unknown Has patient had a PCN reaction causing severe rash involving mucus membranes or skin necrosis: Unknown Has patient had a PCN reaction that required hospitalization: Unknown Has patient had a PCN reaction occurring within the last 10 years: Unknown If all of the above answers are "NO", then may proceed with Cephalosporin use.   Marland Kitchen Penicillins Nausea And Vomiting    Has patient had a PCN reaction causing immediate rash, facial/tongue/throat swelling, SOB or lightheadedness with hypotension: Unknown Has patient had a PCN reaction causing severe rash involving mucus membranes or skin necrosis: Unknown Has patient had a PCN reaction that required hospitalization: Unknown Has patient had a PCN reaction occurring within the last 10 years: Unknown If all of the above answers are "NO", then may proceed with Cephalosporin use.     VITALS:  Blood pressure (!) 121/56, pulse 71, temperature (!) 96.7 F (35.9 C), resp. rate (!) 9, height 4\' 8"  (1.422 m), weight 54.4 kg, SpO2 100 %.  PHYSICAL EXAMINATION:   Physical Exam limited due to dementia  GENERAL:  78 y.o.-year-old patient lying in the bed with no acute distress. thin EYES: Pupils equal, round, reactive to light and accommodation. No scleral icterus. Extraocular muscles intact.  HEENT: Head  atraumatic, normocephalic. Oropharynx and nasopharynx clear.  NECK:  Supple, no jugular venous distention. No thyroid enlargement, no tenderness.  LUNGS: Normal breath sounds bilaterally, no wheezing, rales, rhonchi. No use of accessory muscles of respiration.  CARDIOVASCULAR: S1, S2 normal. No murmurs, rubs, or gallops.  ABDOMEN: Soft, nontender, nondistended. Bowel sounds present. No organomegaly or mass.  EXTREMITIES: No cyanosis, clubbing or edema b/l.    NEUROLOGIC: moves extremities spontaneously  PSYCHIATRIC:  patient is alert. Baseline dementia SKIN: No obvious rash, lesion, or ulcer.   LABORATORY PANEL:  CBC Recent Labs  Lab 05/09/19 0534  WBC 5.7  HGB 10.2*  HCT 31.9*  PLT 156    Chemistries  Recent Labs  Lab 05/09/19 0534  NA 139  K 3.6  CL 106  CO2 26  GLUCOSE 103*  BUN 26*  CREATININE 0.45  CALCIUM 8.8*   Cardiac Enzymes No results for input(s): TROPONINI in the last 168 hours. RADIOLOGY:  Ct Head Wo Contrast  Result Date: 05/08/2019 CLINICAL DATA:  Fall 2 days ago with left hip pain and no ambulation. EXAM: CT HEAD WITHOUT CONTRAST TECHNIQUE: Contiguous axial images were obtained from the base of the skull through the vertex without intravenous contrast. COMPARISON:  CT head 05/20/2018. FINDINGS: Brain: There is no evidence of acute intracranial hemorrhage, mass lesion, brain edema or extra-axial fluid collection. There is moderate atrophy with prominence of the ventricles and subarachnoid spaces. There is confluent low-density in the periventricular white matter which appears stable. There is no CT evidence of acute cortical infarction. Vascular: Prominent intracranial vascular calcifications.  No hyperdense vessel identified. Skull: Negative for fracture or focal lesion. Sinuses/Orbits: Small mucous retention cyst in the left maxillary sinus. The visualized paranasal sinuses and mastoid air cells are otherwise clear. No orbital abnormalities are seen. Other:  Degenerative changes are present at both temporomandibular joints. IMPRESSION: 1. Stable examination without acute findings. 2. Atrophy and chronic small vessel ischemic changes. Electronically Signed   By: Carey BullocksWilliam  Veazey M.D.   On: 05/08/2019 16:47   Dg Hip Unilat W Or Wo Pelvis 2-3 Views Left  Result Date: 05/08/2019 CLINICAL DATA:  Left hip deformity. EXAM: DG HIP (WITH OR WITHOUT PELVIS) 2-3V LEFT COMPARISON:  None. FINDINGS: Exam demonstrates a displaced left femoral neck fracture with superior displacement of the distal fragment resulting in mild coxa vera. Mild diffuse decreased bone mineralization. Mild degenerative change of the left hip. IMPRESSION: Displaced left femoral neck fracture. Electronically Signed   By: Elberta Fortisaniel  Boyle M.D.   On: 05/08/2019 15:29   ASSESSMENT AND PLAN:   Levander CampionMargaret Turner  is a 78 y.o. female with a known history of Alzheimer's dementia and osteoporosis who presented from [place] [facility] with left hip pain after a witnessed fall a couple of days ago.  At baseline, patient is able to walk without an assistive device.    *Left hip fracture s/p mechanical fall -Left hip x-ray with displaced left femoral neck fracture -Orthopedic surgery consultation with Dr. Martha ClanKrasinski appreciated. Patient is postop day 0 -Pain control -Gentle IV fluids -PT consult when appropriate  *Hypothyroidism-stable -Continue home Synthroid  *Alzheimer's dementia- stable -Continue home Ativan, Seroquel, and trazodone -Supportive care  *Severe protein calorie malnutrition- patient is followed by hospice at [place] [facility] -Continue hospice at discharge  D/w PACU RN  CODE STATUS: DNR  DVT Prophylaxis: per ortho  TOTAL TIME TAKING CARE OF THIS PATIENT: *40* minutes.  >50% time spent on counselling and coordination of care  POSSIBLE D/C IN *2-3* DAYS, DEPENDING ON CLINICAL CONDITION.  Note: This dictation was prepared with Dragon dictation along with smaller phrase technology.  Any transcriptional errors that result from this process are unintentional.  Enedina FinnerSona Kimberley Speece M.D on 05/09/2019 at 3:15 PM  Between 7am to 6pm - Pager - [phone]  After 6pm go to [email or web address] - Social research officer, governmentpassword EPAS [facility]  [facility] [place] [facility]  Office  [phone]  CC: Primary care physician; Housecalls, Doctors MakingPatient ID: Sydney Turner, female   DOB: 04-Oct-1941, 78 y.o.   MRN: [id number]

## 2019-05-10 LAB — CBC
HCT: 31 % — ABNORMAL LOW (ref 36.0–46.0)
Hemoglobin: 10 g/dL — ABNORMAL LOW (ref 12.0–15.0)
MCH: 32.5 pg (ref 26.0–34.0)
MCHC: 32.3 g/dL (ref 30.0–36.0)
MCV: 100.6 fL — ABNORMAL HIGH (ref 80.0–100.0)
Platelets: 148 10*3/uL — ABNORMAL LOW (ref 150–400)
RBC: 3.08 MIL/uL — ABNORMAL LOW (ref 3.87–5.11)
RDW: 13.4 % (ref 11.5–15.5)
WBC: 5.9 10*3/uL (ref 4.0–10.5)
nRBC: 0 % (ref 0.0–0.2)

## 2019-05-10 LAB — BASIC METABOLIC PANEL
Anion gap: 6 (ref 5–15)
BUN: 14 mg/dL (ref 8–23)
CO2: 26 mmol/L (ref 22–32)
Calcium: 8.7 mg/dL — ABNORMAL LOW (ref 8.9–10.3)
Chloride: 104 mmol/L (ref 98–111)
Creatinine, Ser: 0.4 mg/dL — ABNORMAL LOW (ref 0.44–1.00)
GFR calc Af Amer: >60 mL/min (ref >60)
GFR calc non Af Amer: >60 mL/min (ref >60)
Glucose, Bld: 124 mg/dL — ABNORMAL HIGH (ref 70–99)
Potassium: 4.1 mmol/L (ref 3.5–5.1)
Sodium: 136 mmol/L (ref 135–145)

## 2019-05-10 MED ORDER — ENSURE ENLIVE PO LIQD
237.0000 mL | Freq: Two times a day (BID) | ORAL | Status: DC
  Administered 2019-05-11 – 2019-05-15 (×8): 237 mL via ORAL

## 2019-05-10 NOTE — Evaluation (Signed)
Physical Therapy Evaluation Patient Details Name: Sydney GumMargaret K Turner MRN: [id number] DOB: July 03, 1941 Today's Date: 05/10/2019   History of Present Illness  Pt is a 78 year old female admitted s/p L posterior approach hemiarthroplasty.  PMH includes baseline dementia, osteoporosis, aphasia and dementia.  Clinical Impression  Pt is a 78 year old female who lives at [facility] and ambulates without a RW at baseline, per pt's daughter.  Pt is very lethargic throughout evaluation and only arouses slightly to some movement and noxious stimuli.  She is nonverbal at baseline.  PT reviewed supine there ex with pt's daughter and pt was only able to perform ankle pumps selectively.  Nurse tech in to monitor vitals and pt presented as tachycardic.  RN notified.  Pt will continue to benefit from skilled PT with focus on strength, functional mobility and fall prevention.    Follow Up Recommendations SNF    Equipment Recommendations  None recommended by PT(TBD at next venue of care.)    Recommendations for Other Services       Precautions / Restrictions Precautions Precautions: Fall;Posterior Hip Restrictions Weight Bearing Restrictions: Yes LLE Weight Bearing: Weight bearing as tolerated      Mobility  Bed Mobility Overal bed mobility: Needs Assistance(Did not perform bed mobility due to pt lethargy.)                Transfers                    Ambulation/Gait                Stairs            Wheelchair Mobility    Modified Rankin (Stroke Patients Only)       Balance                                             Pertinent Vitals/Pain Pain Assessment: Faces Faces Pain Scale: Hurts a little bit Pain Location: L knee with passive knee flexion Pain Descriptors / Indicators: Grimacing;Guarding Pain Intervention(s): Limited activity within patient's tolerance    Home Living Family/patient expects to be discharged to:: Skilled nursing  facility                      Prior Function Level of Independence: Independent with assistive device(s)         Comments: Pt able to ambulate "all day" in the hallway at [facility] per pt's daughter.     Hand Dominance        Extremity/Trunk Assessment   Upper Extremity Assessment Upper Extremity Assessment: Difficult to assess due to impaired cognition    Lower Extremity Assessment Lower Extremity Assessment: Difficult to assess due to impaired cognition       Communication      Cognition Arousal/Alertness: Suspect due to medications;Lethargic Behavior During Therapy: Flat affect Overall Cognitive Status: Impaired/Different from baseline Area of Impairment: (Pt currently somnolent and only arouses partially with noxious stimuli.)                                      General Comments      Exercises Total Joint Exercises Ankle Circles/Pumps: 10 reps;Supine;AAROM Short Arc QuadBarbaraann Boys: AAROM;Left;10 reps;Supine Heel Slides: AAROM;Left;10 reps;Supine Hip ABduction/ADduction: AAROM;Left;10 reps;Supine  Other Exercises Other Exercises: Time to monitor HR and notify RN to pt being tachycardic. x 5 min Other Exercises: Education provided to daughter concerning HEP. x5 min   Assessment/Plan    PT Assessment Patient needs continued PT services  PT Problem List Decreased strength;Decreased mobility;Decreased activity tolerance;Pain       PT Treatment Interventions DME instruction;Therapeutic activities;Gait training;Therapeutic exercise;Patient/family education;Stair training;Balance training;Functional mobility training    PT Goals (Current goals can be found in the Care Plan section)  Acute Rehab PT Goals PT Goal Formulation: Patient unable to participate in goal setting    Frequency BID   Barriers to discharge        Co-evaluation               AM-PAC PT "6 Clicks" Mobility  Outcome Measure Help needed turning from your back  to your side while in a flat bed without using bedrails?: Total Help needed moving from lying on your back to sitting on the side of a flat bed without using bedrails?: Total Help needed moving to and from a bed to a chair (including a wheelchair)?: Total Help needed standing up from a chair using your arms (e.g., wheelchair or bedside chair)?: Total Help needed to walk in hospital room?: Total Help needed climbing 3-5 steps with a railing? : Total 6 Click Score: 6    End of Session   Activity Tolerance: Patient limited by lethargy Patient left: in bed;with family/visitor present;with call bell/phone within reach Nurse Communication: Mobility status PT Visit Diagnosis: Muscle weakness (generalized) (M62.81);Pain Pain - Right/Left: Left Pain - part of body: Hip    Time: 4270-6237 PT Time Calculation (min) (ACUTE ONLY): 30 min   Charges:   PT Evaluation $PT Eval Low Complexity: 1 Low PT Treatments $Therapeutic Activity: 8-22 mins        Roxanne Gates, PT, DPT   Roxanne Gates 05/10/2019, 12:52 PM

## 2019-05-10 NOTE — Progress Notes (Signed)
Visit made. Patient seen lying in bed, eyes closed appeared to be sleeping soundly. She is post op day 1 following left hip fracture repair. Daughter Sydney Turner at bedside. Patient remained asleep even with gentle tactile and verbal stimuli. Per chart note review patient did take her oral medications this morning, but did not eat her breakfast. Mouth care provided, patient did not awaken even with mouth care. Pain remains controlled with scheduled tylenol. PT in during visit, patient was not able to wake and participate. Sydney Turner is hopeful that her mother will be more alert later. PT to return. Sydney Turner remains hopeful that her mother will be able to discharge to skilled rehab. CMRN Deliliah made aware. Will continue to follow and update hospice team. [name] BSN, RN, [facility] [phone]

## 2019-05-10 NOTE — Progress Notes (Signed)
PT Cancellation Note  Patient Details Name: Sydney Turner MRN: [id number] DOB: 02-25-1941   Cancelled Treatment:    Reason Eval/Treat Not Completed: Patient's level of consciousness.  Pt somnolent this p.m.  PT attempted to arouse pt verbally and with gentle sternal rub with no success.  Social worker in to talk with daughter about discharge options.  Will re-attempt tomorrow when pt is hopefully more alert.  Roxanne Gates, PT, DPT  Roxanne Gates 05/10/2019, 3:56 PM

## 2019-05-10 NOTE — Progress Notes (Signed)
[facility] - [place] [facility]   PATIENT NAME: Sydney CampionMargaret Turner    MR#:  [id number]  DATE OF BIRTH:  1941/04/01  SUBJECTIVE:  patient came in from [place] [facility] after a fall and hip pain. Per RN surgery went well. dter in the room patient has advanced Alzheimer's dementia does not verbalize much REVIEW OF SYSTEMS:   Review of Systems  Unable to perform ROS: Dementia   DRUG ALLERGIES:   Allergies  Allergen Reactions  . Sulfa Antibiotics Swelling    Edema  . Amoxicillin     Has patient had a PCN reaction causing immediate rash, facial/tongue/throat swelling, SOB or lightheadedness with hypotension: Unknown Has patient had a PCN reaction causing severe rash involving mucus membranes or skin necrosis: Unknown Has patient had a PCN reaction that required hospitalization: Unknown Has patient had a PCN reaction occurring within the last 10 years: Unknown If all of the above answers are "NO", then may proceed with Cephalosporin use.   Marland Kitchen. Penicillins Nausea And Vomiting    Has patient had a PCN reaction causing immediate rash, facial/tongue/throat swelling, SOB or lightheadedness with hypotension: Unknown Has patient had a PCN reaction causing severe rash involving mucus membranes or skin necrosis: Unknown Has patient had a PCN reaction that required hospitalization: Unknown Has patient had a PCN reaction occurring within the last 10 years: Unknown If all of the above answers are "NO", then may proceed with Cephalosporin use.     VITALS:  Blood pressure (!) 109/56, pulse (!) 117, temperature 99.9 F (37.7 C), temperature source Oral, resp. rate 20, height 4\' 8"  (1.422 m), weight 54.4 kg, SpO2 91 %.  PHYSICAL EXAMINATION:   Physical Exam limited due to dementia  GENERAL:  78 y.o.-year-old patient lying in the bed with no acute distress. Thin cachectic EYES: Pupils equal, round, reactive to light and accommodation. No scleral icterus. Extraocular muscles  intact.  HEENT: Head atraumatic, normocephalic. Oropharynx and nasopharynx clear.  NECK:  Supple, no jugular venous distention. No thyroid enlargement, no tenderness.  LUNGS: Normal breath sounds bilaterally, no wheezing, rales, rhonchi. No use of accessory muscles of respiration.  CARDIOVASCULAR: S1, S2 normal. No murmurs, rubs, or gallops. Mild tachy ABDOMEN: Soft, nontender, nondistended. Bowel sounds present. No organomegaly or mass.  EXTREMITIES: No cyanosis, clubbing or edema b/l.   Surgical dressing + NEUROLOGIC: moves extremities spontaneously  PSYCHIATRIC:  patient is sleepy Baseline dementia SKIN: No obvious rash, lesion, or ulcer.   LABORATORY PANEL:  CBC Recent Labs  Lab 05/10/19 0346  WBC 5.9  HGB 10.0*  HCT 31.0*  PLT 148*    Chemistries  Recent Labs  Lab 05/10/19 0346  NA 136  K 4.1  CL 104  CO2 26  GLUCOSE 124*  BUN 14  CREATININE 0.40*  CALCIUM 8.7*   Cardiac Enzymes No results for input(s): TROPONINI in the last 168 hours. RADIOLOGY:  Ct Head Wo Contrast  Result Date: 05/08/2019 CLINICAL DATA:  Fall 2 days ago with left hip pain and no ambulation. EXAM: CT HEAD WITHOUT CONTRAST TECHNIQUE: Contiguous axial images were obtained from the base of the skull through the vertex without intravenous contrast. COMPARISON:  CT head 05/20/2018. FINDINGS: Brain: There is no evidence of acute intracranial hemorrhage, mass lesion, brain edema or extra-axial fluid collection. There is moderate atrophy with prominence of the ventricles and subarachnoid spaces. There is confluent low-density in the periventricular white matter which appears stable. There is no CT evidence of acute cortical infarction. Vascular: Prominent  intracranial vascular calcifications. No hyperdense vessel identified. Skull: Negative for fracture or focal lesion. Sinuses/Orbits: Small mucous retention cyst in the left maxillary sinus. The visualized paranasal sinuses and mastoid air cells are otherwise  clear. No orbital abnormalities are seen. Other: Degenerative changes are present at both temporomandibular joints. IMPRESSION: 1. Stable examination without acute findings. 2. Atrophy and chronic small vessel ischemic changes. Electronically Signed   By: Carey BullocksWilliam  Veazey M.D.   On: 05/08/2019 16:47   Dg Shoulder Left Port  Result Date: 05/09/2019 CLINICAL DATA:  Fall several days ago with left shoulder pain, initial encounter EXAM: LEFT SHOULDER - 1 VIEW COMPARISON:  None. FINDINGS: There is no evidence of fracture or dislocation. There is no evidence of arthropathy or other focal bone abnormality. Soft tissues are unremarkable. IMPRESSION: No acute abnormality noted. Electronically Signed   By: Alcide CleverMark  Lukens M.D.   On: 05/09/2019 19:34   Dg Hip Port Unilat With Pelvis 1v Left  Result Date: 05/09/2019 CLINICAL DATA:  Left proximal femur fracture. Status post left hip hemiarthroplasty. EXAM: DG HIP (WITH OR WITHOUT PELVIS) 1V PORT LEFT COMPARISON:  Radiographs 05/08/2019 FINDINGS: AP and lateral images demonstrate that the proximal left femoral prosthesis appears in good position. Cerclage wire in place around the intertrochanteric region. The visualized pelvic bones appear normal. IMPRESSION: Satisfactory appearance of the left hip in the AP and lateral projections after left hip hemiarthroplasty. Electronically Signed   By: Francene BoyersJames  Maxwell M.D.   On: 05/09/2019 15:17   Dg Hip Unilat W Or Wo Pelvis 2-3 Views Left  Result Date: 05/08/2019 CLINICAL DATA:  Left hip deformity. EXAM: DG HIP (WITH OR WITHOUT PELVIS) 2-3V LEFT COMPARISON:  None. FINDINGS: Exam demonstrates a displaced left femoral neck fracture with superior displacement of the distal fragment resulting in mild coxa vera. Mild diffuse decreased bone mineralization. Mild degenerative change of the left hip. IMPRESSION: Displaced left femoral neck fracture. Electronically Signed   By: Elberta Fortisaniel  Boyle M.D.   On: 05/08/2019 15:29   ASSESSMENT AND  PLAN:   Sydney CampionMargaret Turner  is a 78 y.o. female with a known history of Alzheimer's dementia and osteoporosis who presented from [facility] with left hip pain after a witnessed fall a couple of days ago.  At baseline, patient is able to walk without an assistive device.    *Left hip fracture s/p mechanical fall -Left hip x-ray with displaced left femoral neck fracture -Orthopedic surgery consultation with Dr. Martha ClanKrasinski appreciated.  -Continue patient is postop day #1 -Pain control -Gentle IV fluids -PT consult when appropriate  *Hypothyroidism-stable -Continue home Synthroid  *Alzheimer's dementia- stable -Continue home Ativan, Seroquel, and trazodone -Supportive care  *Severe protein calorie malnutrition- patient is followed by hospice at [place] [facility] -Muscle wasting, poor poor intake -Dietitian to exactlysee -Continue hospice a days to discharge  D/w dter in the room  CODE STATUS: DNR  DVT Prophylaxis: lovenox  TOTAL TIME TAKING CARE OF THIS PATIENT: *40* minutes.  >50% time spent on counselling and coordination of care  POSSIBLE D/C IN *2-3* DAYS, DEPENDING ON CLINICAL CONDITION.  Note: This dictation was prepared with Dragon dictation along with smaller phrase technology. Any transcriptional errors that result from this process are unintentional.  Enedina FinnerSona Shebra Muldrow M.D on 05/10/2019 at 12:45 PM  Between 7am to 6pm - Pager - [phone]  After 6pm go to [email or web address] - Social research officer, governmentpassword EPAS [facility]  [facility] [place] [facility]  Office  [phone]  CC: Primary care physician; Housecalls, Doctors MakingPatient ID: Sydney Turner, female  DOB: 03-Jan-1941, 78 y.o.   MRN: [id number]

## 2019-05-10 NOTE — Progress Notes (Addendum)
Initial Nutrition Assessment  DOCUMENTATION CODES:   Not applicable  INTERVENTION:   Ensure Enlive po BID, each supplement provides 350 kcal and 20 grams of protein  MVI daily   Dysphagia 3 diet   NUTRITION DIAGNOSIS:   Increased nutrient needs related to hip fracture as evidenced by increased estimated needs.  GOAL:   Patient will meet greater than or equal to 90% of their needs  MONITOR:   PO intake, Supplement acceptance, Labs, Weight trends, Skin, I & O's  REASON FOR ASSESSMENT:   Consult Hip fracture protocol  ASSESSMENT:   78 y.o. female with a known history of Alzheimer's dementia and osteoporosis, followed by home hospice, admitted with hip fracture s/p fall now s/p L hip hemiarthroplasty 7/21  RD working remotely.  Unable to speak with pt as pt is non-verbal at baseline. Pt with increased estimated needs r/t post-op healing. RD will add supplements to help pt meet her estimated needs and support healing. Recommend continue MVI, vitamin D and Oscal. RD will also change pt to a dysphagia 3 diet. Per chart, pt appears fairly weight stable pta.    Pt at high risk for malnutrition but unable to diagnose at this time as NFPE cannot be performed.   Medications reviewed and include: oscal w/ D, Vitamin D, colace, lovenox, ferrous sulfate, synthroid, MVI, NaCl w/ KCl @75ml /hr  Labs reviewed: Hgb 10.0(L), Hct 31.0(L)  Unable to complete Nutrition-Focused physical exam at this time.   Diet Order:   Diet Order            Diet regular Room service appropriate? Yes; Fluid consistency: Thin  Diet effective now             EDUCATION NEEDS:   Not appropriate for education at this time  Skin:  Skin Assessment: Reviewed RN Assessment(Incision L hip)  Last BM:  pta  Height:   Ht Readings from Last 1 Encounters:  05/09/19 4\' 8"  (1.422 m)    Weight:   Wt Readings from Last 1 Encounters:  05/09/19 54.4 kg    Ideal Body Weight:  42.2 kg  BMI:  Body mass  index is 26.9 kg/m.  Estimated Nutritional Needs:   Kcal:  1200-1400kcal/day  Protein:  60-70g/day  Fluid:  >1.2L/day  Koleen Distance MS, RD, LDN Pager #- [phone] Office#- [phone] After Hours Pager: [phone]

## 2019-05-10 NOTE — Progress Notes (Signed)
PT Cancellation Note  Patient Details Name: [name] MRN: [id number] DOB: 10-15-1941   Cancelled Treatment:    Reason Eval/Treat Not Completed: Patient's level of consciousness.  Pt currently asleep and unable to be aroused verbally or physically.  Will return in an hour to see if pt is more alert.  Roxanne Gates, PT, DPT  Roxanne Gates 05/10/2019, 11:00 AM

## 2019-05-10 NOTE — Progress Notes (Signed)
Subjective:  POD #1 s/p left hip hemiarthroplasty.   Patient's daughter is at the bedside.  Patient is lethargic and sleeping.  She was not arousable for physical therapy today.    Objective:   VITALS:   Vitals:   05/10/19 0404 05/10/19 0610 05/10/19 0855 05/10/19 1147  BP: (!) 155/72  140/68 (!) 109/56  Pulse: (!) 105  (!) 110 (!) 117  Resp: 20  18 20   Temp: 100.3 F (37.9 C) (!) 100.7 F (38.2 C) 98.6 F (37 C) 99.9 F (37.7 C)  TempSrc:  Oral  Oral  SpO2: 94%  96% 91%  Weight:      Height:        PHYSICAL EXAM: Left lower extremity Intact pulses distally Incision: dressing C/D/I No cellulitis present Compartment soft  LABS  Results for orders placed or performed during the hospital encounter of 05/08/19 (from the past 24 hour(s))  CBC     Status: Abnormal   Collection Time: 05/10/19  3:46 AM  Result Value Ref Range   WBC 5.9 4.0 - 10.5 K/uL   RBC 3.08 (L) 3.87 - 5.11 MIL/uL   Hemoglobin 10.0 (L) 12.0 - 15.0 g/dL   HCT 31.0 (L) 36.0 - 46.0 %   MCV 100.6 (H) 80.0 - 100.0 fL   MCH 32.5 26.0 - 34.0 pg   MCHC 32.3 30.0 - 36.0 g/dL   RDW 13.4 11.5 - 15.5 %   Platelets 148 (L) 150 - 400 K/uL   nRBC 0.0 0.0 - 0.2 %  Basic metabolic panel     Status: Abnormal   Collection Time: 05/10/19  3:46 AM  Result Value Ref Range   Sodium 136 135 - 145 mmol/L   Potassium 4.1 3.5 - 5.1 mmol/L   Chloride 104 98 - 111 mmol/L   CO2 26 22 - 32 mmol/L   Glucose, Bld 124 (H) 70 - 99 mg/dL   BUN 14 8 - 23 mg/dL   Creatinine, Ser 0.40 (L) 0.44 - 1.00 mg/dL   Calcium 8.7 (L) 8.9 - 10.3 mg/dL   GFR calc non Af Amer >60 >60 mL/min   GFR calc Af Amer >60 >60 mL/min   Anion gap 6 5 - 15    Ct Head Wo Contrast  Result Date: 05/08/2019 CLINICAL DATA:  Fall 2 days ago with left hip pain and no ambulation. EXAM: CT HEAD WITHOUT CONTRAST TECHNIQUE: Contiguous axial images were obtained from the base of the skull through the vertex without intravenous contrast. COMPARISON:  CT head  05/20/2018. FINDINGS: Brain: There is no evidence of acute intracranial hemorrhage, mass lesion, brain edema or extra-axial fluid collection. There is moderate atrophy with prominence of the ventricles and subarachnoid spaces. There is confluent low-density in the periventricular white matter which appears stable. There is no CT evidence of acute cortical infarction. Vascular: Prominent intracranial vascular calcifications. No hyperdense vessel identified. Skull: Negative for fracture or focal lesion. Sinuses/Orbits: Small mucous retention cyst in the left maxillary sinus. The visualized paranasal sinuses and mastoid air cells are otherwise clear. No orbital abnormalities are seen. Other: Degenerative changes are present at both temporomandibular joints. IMPRESSION: 1. Stable examination without acute findings. 2. Atrophy and chronic small vessel ischemic changes. Electronically Signed   By: Richardean Sale M.D.   On: 05/08/2019 16:47   Dg Shoulder Left Port  Result Date: 05/09/2019 CLINICAL DATA:  Fall several days ago with left shoulder pain, initial encounter EXAM: LEFT SHOULDER - 1 VIEW COMPARISON:  None. FINDINGS: There  is no evidence of fracture or dislocation. There is no evidence of arthropathy or other focal bone abnormality. Soft tissues are unremarkable. IMPRESSION: No acute abnormality noted. Electronically Signed   By: Alcide CleverMark  Lukens M.D.   On: 05/09/2019 19:34   Dg Hip Port Unilat With Pelvis 1v Left  Result Date: 05/09/2019 CLINICAL DATA:  Left proximal femur fracture. Status post left hip hemiarthroplasty. EXAM: DG HIP (WITH OR WITHOUT PELVIS) 1V PORT LEFT COMPARISON:  Radiographs 05/08/2019 FINDINGS: AP and lateral images demonstrate that the proximal left femoral prosthesis appears in good position. Cerclage wire in place around the intertrochanteric region. The visualized pelvic bones appear normal. IMPRESSION: Satisfactory appearance of the left hip in the AP and lateral projections after  left hip hemiarthroplasty. Electronically Signed   By: Francene BoyersJames  Maxwell M.D.   On: 05/09/2019 15:17   Dg Hip Unilat W Or Wo Pelvis 2-3 Views Left  Result Date: 05/08/2019 CLINICAL DATA:  Left hip deformity. EXAM: DG HIP (WITH OR WITHOUT PELVIS) 2-3V LEFT COMPARISON:  None. FINDINGS: Exam demonstrates a displaced left femoral neck fracture with superior displacement of the distal fragment resulting in mild coxa vera. Mild diffuse decreased bone mineralization. Mild degenerative change of the left hip. IMPRESSION: Displaced left femoral neck fracture. Electronically Signed   By: Elberta Fortisaniel  Boyle M.D.   On: 05/08/2019 15:29    Assessment/Plan: 1 Day Post-Op   Active Problems:   Closed left hip fracture (HCC)  Patient is sedated likely from anesthesia and narcotic medication.  Continue physical therapy when appropriate.  Begin Lovenox for DVT prophylaxis.  Foley catheter removed.  Patient is completed 24 hours of postop antibiotics.  Patient is weightbearing as tolerated on the left lower extremity.  She should observe posterior hip precautions when up out of bed.  Continue to use abduction pillow while in bed.    Juanell FairlyKevin Nyashia Raney , MD 05/10/2019, 2:11 PM

## 2019-05-10 NOTE — TOC Initial Note (Signed)
Transition of Care [facility]) - Initial/Assessment Note    Patient Details  Name: Sydney Turner MRN: [id number] Date of Birth: 1941-03-01  [facility] CM/SW Contact:    Su Hilt, RN Phone Number: 05/10/2019, 3:23 PM  Clinical Narrative:                  Spoke to the patient's daughter Sydney Turner in the room The patient lives at [facility] She is usually ambulatory  The patient is now sound asleep and not arousable to work with PT or OT I explained to the daughter that in this condition that insurance will not likely approve Rehab.   She requested [place] to wait until the patient is more awake and able to work with PT to send the bed search I explained that we could send the bed search once the patient awakens and see if we can get Insurance approval but due to the patient having advanced Dementia it is still a possibility that the insurance company might not approve rehab, I spoke with her about a back up plan, she stated as a back up she would like the patient to return to [place] and do home health       Patient Goals and CMS Choice        Expected Discharge Plan and Services                                                Prior Living Arrangements/Services                       Activities of Daily Living Home Assistive Devices/Equipment: None ADL Screening (condition at time of admission) Patient's cognitive ability adequate to safely complete daily activities?: No Is the patient deaf or have difficulty hearing?: No Does the patient have difficulty seeing, even when wearing glasses/contacts?: No Does the patient have difficulty concentrating, remembering, or making decisions?: No Patient able to express need for assistance with ADLs?: No Does the patient have difficulty dressing or bathing?: Yes Independently performs ADLs?: No Communication: Independent, Appropriate for developmental age(basically  nonverbal does say some words) Grooming: Dependent Is this a change from baseline?: Pre-admission baseline Feeding: Dependent Is this a change from baseline?: Pre-admission baseline Bathing: Dependent Is this a change from baseline?: Pre-admission baseline Toileting: Dependent Is this a change from baseline?: Pre-admission baseline In/Out Bed: Dependent Is this a change from baseline?: Pre-admission baseline Walks in Home: Independent Does the patient have difficulty walking or climbing stairs?: Yes Weakness of Legs: Left Weakness of Arms/Hands: None  Permission Sought/Granted                  Emotional Assessment              Admission diagnosis:  Closed fracture of neck of left femur, initial encounter ([place]) [S72.002A] Patient Active Problem List   Diagnosis Date Noted  . Closed left hip fracture ([place]) 05/08/2019  . Sepsis secondary to UTI ([place]) 02/03/2017   PCP:  [facility] Pharmacy:  No Pharmacies Listed    Social Determinants of Health (SDOH) Interventions    Readmission Risk Interventions No flowsheet data found.

## 2019-05-10 NOTE — Progress Notes (Signed)
Patient continues to rest in bed, sleeping. Daughter at bedside. Patient does not appear to be in any distress.

## 2019-05-11 LAB — CBC
HCT: 32.1 % — ABNORMAL LOW (ref 36.0–46.0)
Hemoglobin: 10.4 g/dL — ABNORMAL LOW (ref 12.0–15.0)
MCH: 32.4 pg (ref 26.0–34.0)
MCHC: 32.4 g/dL (ref 30.0–36.0)
MCV: 100 fL (ref 80.0–100.0)
Platelets: 169 10*3/uL (ref 150–400)
RBC: 3.21 MIL/uL — ABNORMAL LOW (ref 3.87–5.11)
RDW: 13.3 % (ref 11.5–15.5)
WBC: 6.9 10*3/uL (ref 4.0–10.5)
nRBC: 0 % (ref 0.0–0.2)

## 2019-05-11 LAB — SURGICAL PATHOLOGY

## 2019-05-11 LAB — BASIC METABOLIC PANEL
Anion gap: 4 — ABNORMAL LOW (ref 5–15)
BUN: 13 mg/dL (ref 8–23)
CO2: 29 mmol/L (ref 22–32)
Calcium: 9 mg/dL (ref 8.9–10.3)
Chloride: 105 mmol/L (ref 98–111)
Creatinine, Ser: 0.5 mg/dL (ref 0.44–1.00)
GFR calc Af Amer: >60 mL/min (ref >60)
GFR calc non Af Amer: >60 mL/min (ref >60)
Glucose, Bld: 91 mg/dL (ref 70–99)
Potassium: 4.2 mmol/L (ref 3.5–5.1)
Sodium: 138 mmol/L (ref 135–145)

## 2019-05-11 MED ORDER — POLYSACCHARIDE IRON COMPLEX 150 MG PO CAPS
150.0000 mg | ORAL_CAPSULE | Freq: Every day | ORAL | Status: DC
  Administered 2019-05-11 – 2019-05-15 (×4): 150 mg via ORAL
  Filled 2019-05-11 (×5): qty 1

## 2019-05-11 MED ORDER — TRAZODONE HCL 50 MG PO TABS: 50.0000 mg | ORAL_TABLET | Freq: Every evening | ORAL | Status: DC | PRN

## 2019-05-11 NOTE — Progress Notes (Signed)
Physical Therapy Treatment Patient Details Name: Sydney GumMargaret K Turner MRN: [id number] DOB: 1941-06-11 Today's Date: 05/11/2019    History of Present Illness Pt is a 78 year old female admitted s/p L posterior approach hemiarthroplasty.  PMH includes baseline dementia, osteoporosis, aphasia and dementia.    PT Comments    Pt more alert this morning and responding with 1-2 word responses.  Pt grimaced and reported pain with AAROM supine there ex.  Pt resistant to moving to EOB initially but able to help with bringing LE over EOB and able to sit up without PT assistance.  Pt expressed a fear of falling and was reassured by PT.  PT and pt's daughter assisted pt back to bed and with positioning for comfort.  RN notified of pt report of pain and pt's daughter request for pain medication.     Follow Up Recommendations  SNF     Equipment Recommendations  None recommended by PT(TBD at next venue of care.)    Recommendations for Other Services       Precautions / Restrictions Precautions Precautions: Fall;Posterior Hip Restrictions Weight Bearing Restrictions: Yes LLE Weight Bearing: Weight bearing as tolerated    Mobility  Bed Mobility Overal bed mobility: Needs Assistance Bed Mobility: Supine to Sit;Sit to Supine     Supine to sit: Max assist;HOB elevated Sit to supine: Mod assist;HOB elevated;+2 for physical assistance   General bed mobility comments: Pt able to bring L LE over EOB and needed convincing to complete transfer.  Pt fearful of falling and required frequent encouragement.  Transfers                    Ambulation/Gait                 Stairs             Wheelchair Mobility    Modified Rankin (Stroke Patients Only)       Balance Overall balance assessment: Needs assistance Sitting-balance support: Feet supported Sitting balance-Leahy Scale: Fair Sitting balance - Comments: Pt able to sit up at EOB without assistance from PT for 30-60  sec. Postural control: Posterior lean(Mild due to posture)                                  Cognition Arousal/Alertness: Awake/alert Behavior During Therapy: WFL for tasks assessed/performed Overall Cognitive Status: History of cognitive impairments - at baseline                                 General Comments: Pt more alert today, follows commands for functional mobility but has difficulty with VC's for ther ex.      Exercises Total Joint Exercises Ankle Circles/Pumps: 10 reps;Both;AAROM;Supine Heel Slides: AAROM;Left;10 reps;Supine Hip ABduction/ADduction: AAROM;10 reps;Supine    General Comments        Pertinent Vitals/Pain Pain Assessment: Faces Faces Pain Scale: Hurts little more Pain Location: L hip with heel slides Pain Descriptors / Indicators: Grimacing;Guarding Pain Intervention(s): Limited activity within patient's tolerance;Monitored during session;Patient requesting pain meds-RN notified    Home Living                      Prior Function            PT Goals (current goals can now be found in the care plan section) Acute Rehab  PT Goals PT Goal Formulation: Patient unable to participate in goal setting Progress towards PT goals: Progressing toward goals    Frequency    BID      PT Plan Current plan remains appropriate    Co-evaluation              AM-PAC PT "6 Clicks" Mobility   Outcome Measure  Help needed turning from your back to your side while in a flat bed without using bedrails?: A Lot Help needed moving from lying on your back to sitting on the side of a flat bed without using bedrails?: A Lot Help needed moving to and from a bed to a chair (including a wheelchair)?: Total Help needed standing up from a chair using your arms (e.g., wheelchair or bedside chair)?: Total Help needed to walk in hospital room?: Total Help needed climbing 3-5 steps with a railing? : Total 6 Click Score: 8    End  of Session   Activity Tolerance: Patient limited by lethargy Patient left: in bed;with family/visitor present;with call bell/phone within reach Nurse Communication: Mobility status PT Visit Diagnosis: Muscle weakness (generalized) (M62.81);Pain Pain - Right/Left: Left Pain - part of body: Hip     Time: 1050-1120 PT Time Calculation (min) (ACUTE ONLY): 30 min  Charges:  $Therapeutic Exercise: 8-22 mins $Therapeutic Activity: 8-22 mins                     Roxanne Gates, PT, DPT    Roxanne Gates 05/11/2019, 12:50 PM

## 2019-05-11 NOTE — Progress Notes (Signed)
Visit made. Patient seen sitting up in bed, eyes open, able to smile and acknowledge Probation officer. Daughter Frazer at bedside, she reports patient was able to eat some breakfast this morning. She also drank an Ensure during the visit assisted by [facility]. [name] remains hopeful that her mother will qualify for STR, if not then her next choice is for return to [facility] with [facility]. Physical therapy in at the end of this visit. Will continue to follow and update hospice team. [name] BSN, RN, [facility] [phone]

## 2019-05-11 NOTE — Progress Notes (Signed)
Physical Therapy Treatment Patient Details Name: Sydney Turner MRN: [id number] DOB: 03/25/1941 Today's Date: 05/11/2019    History of Present Illness Pt is a 78 year old female admitted s/p L posterior approach hemiarthroplasty.  PMH includes baseline dementia, osteoporosis, aphasia and dementia.    PT Comments    Pt slightly more alert and speaking in sentences this p.m., though not always appropriate to conversation.  Pt with mildly improved participation in supine there ex but guarding during heel slides.  Pt resistance to sitting at EOB this p.m. and readily returned to bed with PT's help to position.  Nursing in room and pt's vitals have improved since yesterday.  Adduction wedge and SCD in place on L LE at PT departure.  Pt will continue to benefit from skilled PT with focus on strength, functional mobility and pain management.  Follow Up Recommendations  SNF     Equipment Recommendations  None recommended by PT(TBD at next venue of care.)    Recommendations for Other Services       Precautions / Restrictions Precautions Precautions: Fall;Posterior Hip Restrictions Weight Bearing Restrictions: Yes LLE Weight Bearing: Weight bearing as tolerated    Mobility  Bed Mobility Overal bed mobility: Needs Assistance Bed Mobility: Supine to Sit;Sit to Supine     Supine to sit: HOB elevated;Total assist Sit to supine: HOB elevated;Max assist   General bed mobility comments: Pt fearful and resisting movement to EOB this afternoon.  Assistance provided in scooting up in bed and positioning for comfort.  Transfers                    Ambulation/Gait                 Stairs             Wheelchair Mobility    Modified Rankin (Stroke Patients Only)       Balance Overall balance assessment: Needs assistance Sitting-balance support: Feet supported Sitting balance-Leahy Scale: Fair Sitting balance - Comments: Pt able to sit up at EOB without  assistance from PT for 30-60 sec. Postural control: Posterior lean(Mild due to posture)                                  Cognition Arousal/Alertness: Awake/alert Behavior During Therapy: WFL for tasks assessed/performed Overall Cognitive Status: History of cognitive impairments - at baseline                                 General Comments: Pt more alert today, follows commands for functional mobility but has difficulty with VC's for ther ex.  Responding with sentances this p.m.      Exercises Total Joint Exercises Ankle Circles/Pumps: 20 reps;Both;Supine;Strengthening;AAROM Quad Sets: AAROM;Strengthening;Left;10 reps;Supine Short Arc Quad: AAROM;Left;10 reps;Supine Heel Slides: AAROM;Left;10 reps Hip ABduction/ADduction: AAROM;10 reps;Supine Other Exercises Other Exercises: Nursing in room to monitor vitals.  Pt mildly tachycardic but otherwise WNL.  x6 min    General Comments        Pertinent Vitals/Pain Pain Assessment: Faces Faces Pain Scale: Hurts a little bit Pain Location: L hip Pain Descriptors / Indicators: Grimacing;Guarding Pain Intervention(s): Monitored during session;Patient requesting pain meds-RN notified    Home Living                      Prior Function  PT Goals (current goals can now be found in the care plan section) Acute Rehab PT Goals PT Goal Formulation: Patient unable to participate in goal setting Progress towards PT goals: Progressing toward goals(Very minimal improvement with participation with ther ex.)    Frequency    BID      PT Plan Current plan remains appropriate    Co-evaluation              AM-PAC PT "6 Clicks" Mobility   Outcome Measure  Help needed turning from your back to your side while in a flat bed without using bedrails?: Total Help needed moving from lying on your back to sitting on the side of a flat bed without using bedrails?: A Lot Help needed moving to  and from a bed to a chair (including a wheelchair)?: Total Help needed standing up from a chair using your arms (e.g., wheelchair or bedside chair)?: Total Help needed to walk in hospital room?: Total Help needed climbing 3-5 steps with a railing? : Total 6 Click Score: 7    End of Session   Activity Tolerance: Patient limited by lethargy Patient left: in bed;with family/visitor present;with call bell/phone within reach;with bed alarm set Nurse Communication: Mobility status PT Visit Diagnosis: Muscle weakness (generalized) (M62.81);Pain Pain - Right/Left: Left Pain - part of body: Hip     Time: [id number] PT Time Calculation (min) (ACUTE ONLY): 24 min  Charges:  $Therapeutic Exercise: 8-22 mins $Therapeutic Activity: 8-22 mins                     Sydney Turner, PT, DPT    Sydney Turner 05/11/2019, 4:18 PM

## 2019-05-11 NOTE — Progress Notes (Signed)
PT Cancellation Note  Patient Details Name: Sydney Turner MRN: [id number] DOB: November 19, 1940   Cancelled Treatment:    Reason Eval/Treat Not Completed: Patient declined, no reason specified.  Pt more alert and responding with single word commands this morning.  Daughter in room assisting with getting pt started with eating breakfast.  Will check back shortly.  Roxanne Gates, PT, DPT  Roxanne Gates 05/11/2019, 8:42 AM

## 2019-05-11 NOTE — Progress Notes (Signed)
  Subjective:  POD #2 s/p left hip hemiarthroplasty.  Patient has advanced Alzheimer's dementia and is unable to provide a history.  Patient's daughter is at the bedside.  Her daughter explained to the patient was able to participate to some degree with physical therapy.  She was able to sit on the side of the bed and perform some exercises and was following commands.  Patient has been more alert today.  Objective:   VITALS:   Vitals:   05/10/19 1732 05/11/19 0018 05/11/19 0743 05/11/19 1538  BP: (!) 133/55 (!) 153/72 (!) 146/67 134/77  Pulse: 99 (!) 101 93 (!) 108  Resp: 19 18 18 17   Temp: 98.6 F (37 C) 98.8 F (37.1 C) 98.8 F (37.1 C) 98.4 F (36.9 C)  TempSrc: Oral Oral Oral Axillary  SpO2: 97% 98% 98% 96%  Weight:      Height:        PHYSICAL EXAM: Patient is in no acute distress. Left lower extremity: Neurovascular intact Sensation intact distally Intact pulses distally Dorsiflexion/Plantar flexion intact dressing with scant serosanguinous drainage. No cellulitis present Compartment soft  LABS  Results for orders placed or performed during the hospital encounter of 05/08/19 (from the past 24 hour(s))  CBC     Status: Abnormal   Collection Time: 05/11/19  3:39 AM  Result Value Ref Range   WBC 6.9 4.0 - 10.5 K/uL   RBC 3.21 (L) 3.87 - 5.11 MIL/uL   Hemoglobin 10.4 (L) 12.0 - 15.0 g/dL   HCT 32.1 (L) 36.0 - 46.0 %   MCV 100.0 80.0 - 100.0 fL   MCH 32.4 26.0 - 34.0 pg   MCHC 32.4 30.0 - 36.0 g/dL   RDW 13.3 11.5 - 15.5 %   Platelets 169 150 - 400 K/uL   nRBC 0.0 0.0 - 0.2 %  Basic metabolic panel     Status: Abnormal   Collection Time: 05/11/19  3:39 AM  Result Value Ref Range   Sodium 138 135 - 145 mmol/L   Potassium 4.2 3.5 - 5.1 mmol/L   Chloride 105 98 - 111 mmol/L   CO2 29 22 - 32 mmol/L   Glucose, Bld 91 70 - 99 mg/dL   BUN 13 8 - 23 mg/dL   Creatinine, Ser 0.50 0.44 - 1.00 mg/dL   Calcium 9.0 8.9 - 10.3 mg/dL   GFR calc non Af Amer >60 >60 mL/min    GFR calc Af Amer >60 >60 mL/min   Anion gap 4 (L) 5 - 15    Dg Shoulder Left Port  Result Date: 05/09/2019 CLINICAL DATA:  Fall several days ago with left shoulder pain, initial encounter EXAM: LEFT SHOULDER - 1 VIEW COMPARISON:  None. FINDINGS: There is no evidence of fracture or dislocation. There is no evidence of arthropathy or other focal bone abnormality. Soft tissues are unremarkable. IMPRESSION: No acute abnormality noted. Electronically Signed   By: Inez Catalina M.D.   On: 05/09/2019 19:34    Assessment/Plan: 2 Days Post-Op   Active Problems:   Closed left hip fracture [facility])  Patient is stable postop.  She is making progress with physical therapy.  Patient would benefit from a skilled nursing facility upon discharge.  Continue Lovenox 40 mg daily for DVT prophylaxis.  Patient is weightbearing as tolerated on the left lower extremity and should observe posterior hip precautions.    Thornton Park , MD 05/11/2019, 5:31 PM

## 2019-05-11 NOTE — Progress Notes (Signed)
[place] at [place] NAME: Sydney Turner    MR#:  [id number]  DATE OF BIRTH:  10-08-1941  SUBJECTIVE:  patient came in from [place] after a fall and hip pain.  dter in the room patient has advanced Alzheimer's dementia does not verbalize much although more awake this morning.  REVIEW OF SYSTEMS:   Review of Systems  Unable to perform ROS: Dementia   DRUG ALLERGIES:   Allergies  Allergen Reactions  . Sulfa Antibiotics Swelling    Edema  . Amoxicillin     Has patient had a PCN reaction causing immediate rash, facial/tongue/throat swelling, SOB or lightheadedness with hypotension: Unknown Has patient had a PCN reaction causing severe rash involving mucus membranes or skin necrosis: Unknown Has patient had a PCN reaction that required hospitalization: Unknown Has patient had a PCN reaction occurring within the last 10 years: Unknown If all of the above answers are "NO", then may proceed with Cephalosporin use.   Marland Kitchen Penicillins Nausea And Vomiting    Has patient had a PCN reaction causing immediate rash, facial/tongue/throat swelling, SOB or lightheadedness with hypotension: Unknown Has patient had a PCN reaction causing severe rash involving mucus membranes or skin necrosis: Unknown Has patient had a PCN reaction that required hospitalization: Unknown Has patient had a PCN reaction occurring within the last 10 years: Unknown If all of the above answers are "NO", then may proceed with Cephalosporin use.     VITALS:  Blood pressure (!) 146/67, pulse 93, temperature 98.8 F (37.1 C), temperature source Oral, resp. rate 18, height 4\' 8"  (1.422 m), weight 54.4 kg, SpO2 98 %.  PHYSICAL EXAMINATION:   Physical Exam limited due to dementia  GENERAL:  78 y.o.-year-old patient lying in the bed with no acute distress. Thin cachectic EYES: Pupils equal, round, reactive to light and accommodation. No scleral icterus. Extraocular  muscles intact.  HEENT: Head atraumatic, normocephalic. Oropharynx and nasopharynx clear.  NECK:  Supple, no jugular venous distention. No thyroid enlargement, no tenderness.  LUNGS: Normal breath sounds bilaterally, no wheezing, rales, rhonchi. No use of accessory muscles of respiration.  CARDIOVASCULAR: S1, S2 normal. No murmurs, rubs, or gallops. Mild tachy ABDOMEN: Soft, nontender, nondistended. Bowel sounds present. No organomegaly or mass.  EXTREMITIES: No cyanosis, clubbing or edema b/l.   Surgical dressing + NEUROLOGIC: moves extremities spontaneously  PSYCHIATRIC:  patient is sleepy Baseline dementia SKIN: No obvious rash, lesion, or ulcer.   LABORATORY PANEL:  CBC Recent Labs  Lab 05/11/19 0339  WBC 6.9  HGB 10.4*  HCT 32.1*  PLT 169    Chemistries  Recent Labs  Lab 05/11/19 0339  NA 138  K 4.2  CL 105  CO2 29  GLUCOSE 91  BUN 13  CREATININE 0.50  CALCIUM 9.0   Cardiac Enzymes No results for input(s): TROPONINI in the last 168 hours. RADIOLOGY:  Dg Shoulder Left Port  Result Date: 05/09/2019 CLINICAL DATA:  Fall several days ago with left shoulder pain, initial encounter EXAM: LEFT SHOULDER - 1 VIEW COMPARISON:  None. FINDINGS: There is no evidence of fracture or dislocation. There is no evidence of arthropathy or other focal bone abnormality. Soft tissues are unremarkable. IMPRESSION: No acute abnormality noted. Electronically Signed   By: Inez Catalina M.D.   On: 05/09/2019 19:34   Dg Hip Port Unilat With Pelvis 1v Left  Result Date: 05/09/2019 CLINICAL DATA:  Left proximal femur fracture. Status post left hip hemiarthroplasty. EXAM: DG HIP (  WITH OR WITHOUT PELVIS) 1V PORT LEFT COMPARISON:  Radiographs 05/08/2019 FINDINGS: AP and lateral images demonstrate that the proximal left femoral prosthesis appears in good position. Cerclage wire in place around the intertrochanteric region. The visualized pelvic bones appear normal. IMPRESSION: Satisfactory appearance of  the left hip in the AP and lateral projections after left hip hemiarthroplasty. Electronically Signed   By: Francene BoyersJames  Maxwell M.D.   On: 05/09/2019 15:17   ASSESSMENT AND PLAN:   Levander CampionMargaret Mullings  is a 78 y.o. female with a known history of Alzheimer's dementia and osteoporosis who presented from [facility] with left hip pain after a witnessed fall a couple of days ago.  At baseline, patient is able to walk without an assistive device.    *Left hip fracture s/p mechanical fall -Left hip x-ray with displaced left femoral neck fracture -Orthopedic surgery consultation with Dr. Martha ClanKrasinski appreciated.  -Continue patient is postop day # 2 -Pain control -received IV fluids--good UOP, BMP normal. D/c IVF -PT consult when appropriate  *Hypothyroidism-stable -Continue home Synthroid  *Alzheimer's dementia- stable -Continue home Ativan, Seroquel, and trazodone -Supportive care  *Severe protein calorie malnutrition- patient is followed by hospice at [facility] -Muscle wasting, poor poor intake -Dietitian to see  physical therapy initiated. Discharge planning per care manager.    D/w dter in the room  CODE STATUS: DNR  DVT Prophylaxis: lovenox  TOTAL TIME TAKING CARE OF THIS PATIENT: *30* minutes.  >50% time spent on counselling and coordination of care  POSSIBLE D/C IN *2-3* DAYS, DEPENDING ON CLINICAL CONDITION.  Note: This dictation was prepared with Dragon dictation along with smaller phrase technology. Any transcriptional errors that result from this process are unintentional.  Enedina FinnerSona Jacie Tristan M.D on 05/11/2019 at 2:45 PM  Between 7am to 6pm - Pager - [phone]  After 6pm go to [email or web address] - Social research officer, governmentpassword [email or web address]  [facility]  Office  [phone]  CC: Primary care physician; Housecalls, Doctors MakingPatient ID: Prescott GumMargaret K Mcdiarmid, female   DOB: 01/09/1941, 78 y.o.   MRN: [id number]

## 2019-05-11 NOTE — TOC Progression Note (Signed)
Transition of Care [facility]) - Progression Note    Patient Details  Name: Sydney Turner MRN: [id number] Date of Birth: [birth year]  Transition of Care [facility]) CM/SW [place], RN Phone Number: 05/11/2019, 2:57 PM  Clinical Narrative:     Patient continues to not work with PT, Will wait to get PT evaluation to do a bed search for SNF, does wish to go to [facility] if able and can get insurance auth      Expected Discharge Plan and Services                                                 Social Determinants of Health (SDOH) Interventions    Readmission Risk Interventions No flowsheet data found.

## 2019-05-12 LAB — CBC
HCT: 30 % — ABNORMAL LOW (ref 36.0–46.0)
Hemoglobin: 9.8 g/dL — ABNORMAL LOW (ref 12.0–15.0)
MCH: 32.8 pg (ref 26.0–34.0)
MCHC: 32.7 g/dL (ref 30.0–36.0)
MCV: 100.3 fL — ABNORMAL HIGH (ref 80.0–100.0)
Platelets: 198 10*3/uL (ref 150–400)
RBC: 2.99 MIL/uL — ABNORMAL LOW (ref 3.87–5.11)
RDW: 13.3 % (ref 11.5–15.5)
WBC: 8 10*3/uL (ref 4.0–10.5)
nRBC: 0 % (ref 0.0–0.2)

## 2019-05-12 MED ORDER — DOCUSATE SODIUM 50 MG/5ML PO LIQD
100.0000 mg | Freq: Two times a day (BID) | ORAL | Status: DC
  Administered 2019-05-12 – 2019-05-15 (×6): 100 mg via ORAL
  Filled 2019-05-12 (×7): qty 10

## 2019-05-12 NOTE — Progress Notes (Signed)
  Subjective:  POD #3 s/p left hip hemiarthroplasty.  Patient has advanced Alzheimer's dementia and is unable to provide a history.  Her daughter is at the bedside.     Objective:   VITALS:   Vitals:   05/11/19 0743 05/11/19 1538 05/11/19 2311 05/12/19 0737  BP: (!) 146/67 134/77 (!) 135/49 (!) 140/49  Pulse: 93 (!) 108 (!) 105 96  Resp: 18 17 19 15   Temp: 98.8 F (37.1 C) 98.4 F (36.9 C) 99.9 F (37.7 C) 98.5 F (36.9 C)  TempSrc: Oral Axillary Axillary Axillary  SpO2: 98% 96% 96% 93%  Weight:      Height:        PHYSICAL EXAM: Left lower extremity Intact pulses distally Dorsiflexion/Plantar flexion intact Incision: scant drainage No cellulitis present Compartment soft  LABS  Results for orders placed or performed during the hospital encounter of 05/08/19 (from the past 24 hour(s))  CBC     Status: Abnormal   Collection Time: 05/12/19  3:52 AM  Result Value Ref Range   WBC 8.0 4.0 - 10.5 K/uL   RBC 2.99 (L) 3.87 - 5.11 MIL/uL   Hemoglobin 9.8 (L) 12.0 - 15.0 g/dL   HCT 30.0 (L) 36.0 - 46.0 %   MCV 100.3 (H) 80.0 - 100.0 fL   MCH 32.8 26.0 - 34.0 pg   MCHC 32.7 30.0 - 36.0 g/dL   RDW 13.3 11.5 - 15.5 %   Platelets 198 150 - 400 K/uL   nRBC 0.0 0.0 - 0.2 %    No results found.  Assessment/Plan: 3 Days Post-Op   Active Problems:   Closed left hip fracture ([place])  Patient remained stable postop.  She is doing well from orthopedic standpoint.  Patient is weightbearing as tolerated on the left lower extremity.  She should continue to observe posterior hip precautions.  Continue to use hip abduction pillow while in bed.  Patient may be discharged to skilled nursing facility from an orthopedic standpoint when cleared by medicine.  Patient will follow-up with me in the office in approximately 2 weeks after discharge.  Continue Lovenox for DVT prophylaxis x 4 weeks.    Thornton Park , MD 05/12/2019, 1:40 PM

## 2019-05-12 NOTE — Progress Notes (Signed)
Visit made, patient seen sitting up in the recliner, alert, daughter [name] at bedside. As stated in previous visit notes Sydney Turner remains hopeful that her mother will discharge to SNF for skilled rehab. Bed search underway. Sydney Turner again stated that if her mother does not qualify for short term rehab, she would still want to revoke the hospice benefit for her mother to have PT at [facility]. Revocation form signed, date of revocation remains blank as discharge date is undetermined at this time. [name] voiced her understanding. Will continue to follow and update hospice team though final discharge.  [name] BSN, RN, [facility] [phone]

## 2019-05-12 NOTE — Progress Notes (Signed)
[facility] [place] [facility]   PATIENT NAME: Sydney Turner    MR#:  [id number]  DATE OF BIRTH:  1941/09/09  SUBJECTIVE:  patient came in from [facility] after a fall and hip pain.   dter in the room patient has advanced Alzheimer's dementia does not verbalize much although more awake this morning. Patient did work a little bit with PT earlier today  REVIEW OF SYSTEMS:   Review of Systems  Unable to perform ROS: Dementia   DRUG ALLERGIES:   Allergies  Allergen Reactions  . Sulfa Antibiotics Swelling    Edema  . Amoxicillin     Has patient had a PCN reaction causing immediate rash, facial/tongue/throat swelling, SOB or lightheadedness with hypotension: Unknown Has patient had a PCN reaction causing severe rash involving mucus membranes or skin necrosis: Unknown Has patient had a PCN reaction that required hospitalization: Unknown Has patient had a PCN reaction occurring within the last 10 years: Unknown If all of the above answers are "NO", then may proceed with Cephalosporin use.   Marland Kitchen. Penicillins Nausea And Vomiting    Has patient had a PCN reaction causing immediate rash, facial/tongue/throat swelling, SOB or lightheadedness with hypotension: Unknown Has patient had a PCN reaction causing severe rash involving mucus membranes or skin necrosis: Unknown Has patient had a PCN reaction that required hospitalization: Unknown Has patient had a PCN reaction occurring within the last 10 years: Unknown If all of the above answers are "NO", then may proceed with Cephalosporin use.     VITALS:  Blood pressure (!) 140/49, pulse 96, temperature 98.5 F (36.9 C), temperature source Axillary, resp. rate 15, height 4\' 8"  (1.422 m), weight 54.4 kg, SpO2 93 %.  PHYSICAL EXAMINATION:   Physical Exam limited due to dementia  GENERAL:  78 y.o.-year-old patient lying in the bed with no acute distress.  Thin cachectic EYES: Pupils equal, round, reactive  to light and accommodation. No scleral icterus. Extraocular muscles intact.  HEENT: Head atraumatic, normocephalic. Oropharynx and nasopharynx clear.  NECK:  Supple, no jugular venous distention. No thyroid enlargement, no tenderness.  LUNGS: Normal breath sounds bilaterally, no wheezing, rales, rhonchi. No use of accessory muscles of respiration.  CARDIOVASCULAR: S1, S2 normal. No murmurs, rubs, or gallops. Mild tachy ABDOMEN: Soft, nontender, nondistended. Bowel sounds present. No organomegaly or mass.  EXTREMITIES: No cyanosis, clubbing or edema b/l.   Surgical dressing + NEUROLOGIC: moves extremities spontaneously  PSYCHIATRIC:  patient is sleepy Baseline dementia SKIN: No obvious rash, lesion, or ulcer.   LABORATORY PANEL:  CBC Recent Labs  Lab 05/12/19 0352  WBC 8.0  HGB 9.8*  HCT 30.0*  PLT 198    Chemistries  Recent Labs  Lab 05/11/19 0339  NA 138  K 4.2  CL 105  CO2 29  GLUCOSE 91  BUN 13  CREATININE 0.50  CALCIUM 9.0   Cardiac Enzymes No results for input(s): TROPONINI in the last 168 hours. RADIOLOGY:  No results found. ASSESSMENT AND PLAN:   Sydney CampionMargaret Walle  is a 78 y.o. female with a known history of Alzheimer's dementia and osteoporosis who presented from [facility] with left hip pain after a witnessed fall a couple of days ago.  At baseline, patient is able to walk without an assistive device.    *Left hip fracture s/p mechanical fall -Left hip x-ray with displaced left femoral neck fracture -Orthopedic surgery consultation with Dr. Martha ClanKrasinski appreciated.  -Continue patient is postop day # 3 -Pain control -  received IV fluids--good UOP, BMP normal. - D/c IVF -PT consult noted  *Hypothyroidism-stable -Continue home Synthroid  *Alzheimer's dementia- stable -Continue home Ativan, Seroquel, and trazodone -Supportive care  *Severe protein calorie malnutrition- patient is followed by hospice at [place] -Muscle wasting, poor poor  intake -Dietitian to see  physical therapy initiated. Discharge planning per care manager. If patient's insurance does not approve patient will discharged to [place] with home health PT. Care manager has discussed with patient's daughter.    D/w dter in the room  CODE STATUS: DNR  DVT Prophylaxis: lovenox  TOTAL TIME TAKING CARE OF THIS PATIENT: *30* minutes.  >50% time spent on counselling and coordination of care  Patient is medically stable. Discharge pending insurance authorization  Note: This dictation was prepared with Dragon dictation along with smaller phrase technology. Any transcriptional errors that result from this process are unintentional.  Fritzi Mandes M.D on 05/12/2019 at 11:55 AM  Between 7am to 6pm - Pager - [phone]  After 6pm go to [email or web address] - password [email or web address] [place] [facility]  Office  [phone]  CC: Primary care physician; Housecalls, Doctors MakingPatient ID: Sydney Turner, female   DOB: December 17, 1940, 78 y.o.   MRN: [id number]

## 2019-05-12 NOTE — TOC Progression Note (Signed)
Transition of Care [facility]) - Progression Note    Patient Details  Name: Sydney Turner MRN: [id number] Date of Birth: 06-05-1941  Transition of Care [facility]) CM/SW [place], RN Phone Number: 05/12/2019, 10:27 AM  Clinical Narrative:      Spoke with [name] the daughter and got the go ahead to do FL2 and bedsearch, I reminded her that it could be not ap-p-roved by insurance due to advanced dementia, she stated understanding and the back up is to go back to [facility] with [facility] if not approved      Expected Discharge Plan and Services                                                 Social Determinants of Health (SDOH) Interventions    Readmission Risk Interventions No flowsheet data found.

## 2019-05-12 NOTE — Progress Notes (Signed)
Physical Therapy Treatment Patient Details Name: Sydney Turner MRN: [id number] DOB: 1941/08/25 Today's Date: 05/12/2019    History of Present Illness Pt is a 78 year old female admitted s/p L posterior approach hemiarthroplasty.  PMH includes baseline dementia, osteoporosis, aphasia and dementia.    PT Comments    Pt in bed awoke easily to verbal and tactile stim.  Daughter in room.  Participated in exercises as described below.  To edge of bed with max a x 1.  Seemed less fearful of movement today.  Once sitting she is able to sit for 10 minutes with occasional verbal and tactile cues to correct balance.  Attempted to stand to walker but given assist level was unable with +1 assist.  Walker removed and she was able to stand x 2 with max a x 1 for 15 seconds each.  During standing she did have a BM and was assisted back to supine for care.  Medium formed BM.  RN aware.  Positioned for comfort.   Daughter in room and discussed at length discharge options of SNF vs return to ALF.  Pt was independent at ALF walking frequently during the day without assist prior to surgery.  Daughter would like to give her mother the best chance at regaining as much independence as she can and is leaning to rehab after discharge.  Discharge planner in also and discussed.      Follow Up Recommendations  SNF     Equipment Recommendations  None recommended by PT    Recommendations for Other Services       Precautions / Restrictions Precautions Precautions: Fall;Posterior Hip Restrictions Weight Bearing Restrictions: Yes LLE Weight Bearing: Weight bearing as tolerated    Mobility  Bed Mobility Overal bed mobility: Needs Assistance Bed Mobility: Supine to Sit;Sit to Supine     Supine to sit: HOB elevated;Max assist Sit to supine: HOB elevated;Max assist   General bed mobility comments: seemed less fearful this am of movement  Transfers Overall transfer level: Needs assistance   Transfers: Sit  to/from Stand Sit to Stand: Max assist         General transfer comment: able to stand x 2 with max a x 1 for 15 seconds each attempt.  Attempted RW but she was unable to place hands on walker with +1 assist only.  Ambulation/Gait             General Gait Details: unable   Stairs             Wheelchair Mobility    Modified Rankin (Stroke Patients Only)       Balance Overall balance assessment: Needs assistance Sitting-balance support: Feet supported Sitting balance-Leahy Scale: Fair Sitting balance - Comments: able to sit unsupported today once seated with occasional min verbal and tactile cues to correct imbalances Postural control: Right lateral lean Standing balance support: Bilateral upper extremity supported Standing balance-Leahy Scale: Zero                              Cognition Arousal/Alertness: Awake/alert Behavior During Therapy: WFL for tasks assessed/performed Overall Cognitive Status: History of cognitive impairments - at baseline                                 General Comments: Pt more alert today, follows commands for functional mobility but has difficulty with VC's for ther  ex.  Responding with sentances this p.m.      Exercises Other Exercises Other Exercises: supine LLE AAROM for ankle pumps, heels slides, ab/add x 10 min    General Comments        Pertinent Vitals/Pain Pain Assessment: Faces Faces Pain Scale: Hurts a little bit Pain Location: L hip  - seem sgenerally comfortable Pain Descriptors / Indicators: Grimacing;Guarding Pain Intervention(s): Monitored during session;Premedicated before session    Home Living                      Prior Function            PT Goals (current goals can now be found in the care plan section) Progress towards PT goals: Progressing toward goals    Frequency    BID      PT Plan Current plan remains appropriate    Co-evaluation               AM-PAC PT "6 Clicks" Mobility   Outcome Measure  Help needed turning from your back to your side while in a flat bed without using bedrails?: Total Help needed moving from lying on your back to sitting on the side of a flat bed without using bedrails?: A Lot Help needed moving to and from a bed to a chair (including a wheelchair)?: Total Help needed standing up from a chair using your arms (e.g., wheelchair or bedside chair)?: Total Help needed to walk in hospital room?: Total Help needed climbing 3-5 steps with a railing? : Total 6 Click Score: 7    End of Session Equipment Utilized During Treatment: Gait belt Activity Tolerance: Patient tolerated treatment well Patient left: in bed;with family/visitor present;with call bell/phone within reach;with bed alarm set Nurse Communication: Other (comment) Pain - Right/Left: Left Pain - part of body: Hip     Time: 2536-6440 PT Time Calculation (min) (ACUTE ONLY): 34 min  Charges:  $Therapeutic Exercise: 8-22 mins $Therapeutic Activity: 8-22 mins                    Chesley Noon, PTA 05/12/19, 10:38 AM

## 2019-05-12 NOTE — TOC Progression Note (Signed)
Transition of Care [facility]) - Progression Note    Patient Details  Name: RAYGEN LINQUIST MRN: [id number] Date of Birth: February 08, 1941  Transition of Care [facility]) CM/SW Contact  Su Hilt, RN Phone Number: 05/12/2019, 4:27 PM  Clinical Narrative:    [facility], [name] has accepted the bed offer and [facility] K[facility] will start insurance auth request        Expected Discharge Plan and Services                                                 Social Determinants of Health (SDOH) Interventions    Readmission Risk Interventions No flowsheet data found.

## 2019-05-12 NOTE — [place] FL2 (Signed)
[place] MEDICAID FL2 LEVEL OF CARE SCREENING TOOL     IDENTIFICATION  Patient Name: Sydney GumMargaret K Bibian Birthdate: [birth year] Sex: female Admission Date (Current Location): 05/08/2019  [place]   Facility and Address:  [facility], [address], [place], [place] [address]      Provider Number: [id number]  Attending Physician Name and Address:  Enedina FinnerPatel, Sona, MD  Relative Name and Phone Number:  Jeanice LimHolly daughter [phone]    Current Level of Care: Hospital Recommended Level of Care: Skilled Nursing Facility Prior Approval Number:    Date Approved/Denied:   PASRR Number: [id number]  Discharge Plan: SNF    Current Diagnoses: Patient Active Problem List   Diagnosis Date Noted  . Closed left hip fracture (HCC) 05/08/2019  . Sepsis secondary to UTI (HCC) 02/03/2017    Orientation RESPIRATION BLADDER Height & Weight     Self  Normal Incontinent Weight: 54.4 kg Height:  4\' 8"  (142.2 cm)  BEHAVIORAL SYMPTOMS/MOOD NEUROLOGICAL BOWEL NUTRITION STATUS      Incontinent    AMBULATORY STATUS COMMUNICATION OF NEEDS Skin   Extensive Assist Verbally Normal, Surgical wounds                       Personal Care Assistance Level of Assistance  Total care       Total Care Assistance: Maximum assistance   Functional Limitations Info  Sight, Hearing, Speech   Hearing Info: Impaired Speech Info: Impaired    SPECIAL CARE FACTORS FREQUENCY  PT (By licensed PT)     PT Frequency: 5 times per week              Contractures Contractures Info: Not present    Additional Factors Info  Code Status, Allergies Code Status Info: DNR Allergies Info: Sulfa Antibiotics, Amoxicillin, Penicillins           Current Medications (05/12/2019):  This is the current hospital active medication list Current Facility-Administered Medications  Medication Dose Route Frequency Provider Last Rate Last Dose  . acetaminophen  (TYLENOL) tablet 650 mg  650 mg Oral Q6H PRN Juanell FairlyKrasinski, Kevin, MD   650 mg at 05/12/19 0913   Or  . acetaminophen (TYLENOL) suppository 650 mg  650 mg Rectal Q6H PRN Juanell FairlyKrasinski, Kevin, MD      . alum & mag hydroxide-simeth (MAALOX/MYLANTA) 200-200-20 MG/5ML suspension 30 mL  30 mL Oral Q4H PRN Juanell FairlyKrasinski, Kevin, MD      . bisacodyl (DULCOLAX) suppository 10 mg  10 mg Rectal Daily PRN Juanell FairlyKrasinski, Kevin, MD   10 mg at 05/11/19 0934  . calcium-vitamin D (OSCAL WITH D) 500-200 MG-UNIT per tablet 1 tablet  1 tablet Oral Daily Juanell FairlyKrasinski, Kevin, MD   1 tablet at 05/10/19 [phone]  . cholecalciferol (VITAMIN D) tablet 1,000 Units  1,000 Units Oral Daily Juanell FairlyKrasinski, Kevin, MD   1,000 Units at 05/10/19 [phone]  . docusate sodium (COLACE) capsule 100 mg  100 mg Oral BID Juanell FairlyKrasinski, Kevin, MD   100 mg at 05/11/19 2226  . enoxaparin (LOVENOX) injection 40 mg  40 mg Subcutaneous Q24H Juanell FairlyKrasinski, Kevin, MD   40 mg at 05/12/19 0913  . feeding supplement (ENSURE ENLIVE) (ENSURE ENLIVE) liquid 237 mL  237 mL Oral BID BM Enedina FinnerPatel, Sona, MD   237 mL at 05/11/19 1524  . HYDROcodone-acetaminophen (NORCO) 7.5-325 MG per tablet 1-2 tablet  1-2 tablet Oral Q4H PRN Juanell FairlyKrasinski, Kevin, MD      . iron polysaccharides (NIFEREX) capsule  150 mg  150 mg Oral Daily Fritzi Mandes, MD   150 mg at 05/11/19 1524  . levothyroxine (SYNTHROID) tablet 112 mcg  112 mcg Oral Q0600 Thornton Park, MD   112 mcg at 05/12/19 0720  . magnesium citrate solution 1 Bottle  1 Bottle Oral Once PRN Thornton Park, MD      . methocarbamol (ROBAXIN) tablet 500 mg  500 mg Oral Q6H PRN Thornton Park, MD       Or  . methocarbamol (ROBAXIN) 500 mg in dextrose 5 % 50 mL IVPB  500 mg Intravenous Q6H PRN Thornton Park, MD      . morphine 4 MG/ML injection 0.52-1 mg  0.52-1 mg Intravenous Q2H PRN Thornton Park, MD      . ondansetron [facility]) tablet 4 mg  4 mg Oral Q6H PRN Thornton Park, MD       Or  . ondansetron [facility]) injection 4 mg  4 mg Intravenous Q6H PRN  Thornton Park, MD      . polyethylene glycol (MIRALAX / GLYCOLAX) packet 17 g  17 g Oral Daily PRN Thornton Park, MD      . QUEtiapine (SEROQUEL) tablet 50 mg  50 mg Oral BID Thornton Park, MD   50 mg at 05/10/19 0835  . traMADol (ULTRAM) tablet 50 mg  50 mg Oral Q6H PRN Thornton Park, MD   50 mg at 05/08/19 2108  . traZODone (DESYREL) tablet 50 mg  50 mg Oral QHS PRN Fritzi Mandes, MD         Discharge Medications: Please see discharge summary for a list of discharge medications.  Relevant Imaging Results:  Relevant Lab Results:   Additional Information [id number]  Su Hilt, RN

## 2019-05-12 NOTE — Progress Notes (Signed)
Physical Therapy Treatment Patient Details Name: Sydney GumMargaret K Brinson MRN: [id number] DOB: Aug 15, 1941 Today's Date: 05/12/2019    History of Present Illness Pt is a 78 year old female admitted s/p L posterior approach hemiarthroplasty.  PMH includes baseline dementia, osteoporosis, aphasia and dementia.    PT Comments    Pt transferred to recliner at bedside max a /dependant +1.  Remained in recliner for lunch.  Daughter in and updated on transfer status and needs.  Follow Up Recommendations  SNF     Equipment Recommendations  None recommended by PT    Recommendations for Other Services       Precautions / Restrictions Precautions Precautions: Fall;Posterior Hip Restrictions Weight Bearing Restrictions: Yes LLE Weight Bearing: Weight bearing as tolerated    Mobility  Bed Mobility Overal bed mobility: Needs Assistance Bed Mobility: Supine to Sit     Supine to sit: HOB elevated;Max assist Sit to supine: HOB elevated;Max assist   General bed mobility comments: seemed less fearful this am of movement  Transfers Overall transfer level: Needs assistance Equipment used: None Transfers: Sit to/from Stand Sit to Stand: Max assist;Total assist         General transfer comment: max a dependant to recliner.  Recommend +2 assist with nursing staff.  Ambulation/Gait             General Gait Details: unable   Stairs             Wheelchair Mobility    Modified Rankin (Stroke Patients Only)       Balance Overall balance assessment: Needs assistance Sitting-balance support: Feet supported Sitting balance-Leahy Scale: Fair Sitting balance - Comments: able to sit unsupported today once seated with occasional min verbal and tactile cues to correct imbalances Postural control: Right lateral lean Standing balance support: Bilateral upper extremity supported Standing balance-Leahy Scale: Zero                              Cognition  Arousal/Alertness: Awake/alert Behavior During Therapy: WFL for tasks assessed/performed Overall Cognitive Status: History of cognitive impairments - at baseline                                 General Comments: Pt more alert today, follows commands for functional mobility but has difficulty with VC's for ther ex.  Responding with sentances this p.m.      Exercises Other Exercises Other Exercises: supine LLE AAROM for ankle pumps, heels slides, ab/add x 10 min    General Comments        Pertinent Vitals/Pain Pain Assessment: Faces Faces Pain Scale: Hurts a little bit Pain Location: L hip  - seem sgenerally comfortable Pain Descriptors / Indicators: Grimacing;Guarding Pain Intervention(s): Limited activity within patient's tolerance;Monitored during session    Home Living                      Prior Function            PT Goals (current goals can now be found in the care plan section) Progress towards PT goals: Progressing toward goals    Frequency    BID      PT Plan Current plan remains appropriate    Co-evaluation              AM-PAC PT "6 Clicks" Mobility   Outcome Measure  Help needed  turning from your back to your side while in a flat bed without using bedrails?: Total Help needed moving from lying on your back to sitting on the side of a flat bed without using bedrails?: A Lot Help needed moving to and from a bed to a chair (including a wheelchair)?: Total Help needed standing up from a chair using your arms (e.g., wheelchair or bedside chair)?: Total Help needed to walk in hospital room?: Total Help needed climbing 3-5 steps with a railing? : Total 6 Click Score: 7    End of Session Equipment Utilized During Treatment: Gait belt Activity Tolerance: Patient tolerated treatment well Patient left: in chair;with call bell/phone within reach;with chair alarm set;with family/visitor present Nurse Communication: Other  (comment) Pain - Right/Left: Left Pain - part of body: Hip     Time: 3893-7342 PT Time Calculation (min) (ACUTE ONLY): 12 min  Charges:  $Therapeutic Exercise: 8-22 mins $Therapeutic Activity: 8-22 mins                     Chesley Noon, PTA 05/12/19, 1:12 PM

## 2019-05-12 NOTE — TOC Progression Note (Signed)
Transition of Care [facility]) - Progression Note    Patient Details  Name: Sydney Turner MRN: [id number] Date of Birth: 06/05/1941  [facility] CM/SW Contact  Su Hilt, RN Phone Number: 05/12/2019, 3:57 PM  Clinical Narrative:     Damaris Schooner to [place] the patient's daughter, reviewed to current bed offers, She does not want her mom to go to [place] due to that is where her father died.  She will look up [facility] and She asked me to follow up with [facility].  I reached out to [place] [facility] and requested that she take a look. I explained insurance auth request does not start until she chooses a bed.  She states understanding.        Expected Discharge Plan and Services                                                 Social Determinants of Health (SDOH) Interventions    Readmission Risk Interventions No flowsheet data found.

## 2019-05-12 NOTE — Care Management Important Message (Signed)
Important Message  Patient Details  Name: Sydney Turner MRN: [id number] Date of Birth: 1941-10-04   Medicare Important Message Given:  Yes     Juliann Pulse A Shannon Kirkendall 05/12/2019, 12:01 PM

## 2019-05-13 MED ORDER — NYSTATIN 100000 UNIT/ML MT SUSP
5.0000 mL | Freq: Four times a day (QID) | OROMUCOSAL | Status: DC
  Administered 2019-05-13 – 2019-05-15 (×6): 500000 [IU] via OROMUCOSAL
  Filled 2019-05-13 (×5): qty 5

## 2019-05-13 NOTE — Progress Notes (Addendum)
Physical Therapy Treatment Patient Details Name: Sydney Turner MRN: [id number] DOB: 1941-05-12 Today's Date: 05/13/2019    History of Present Illness Pt is a 78 year old female admitted s/p L posterior approach hemiarthroplasty.  PMH includes baseline dementia, osteoporosis, aphasia and dementia.    PT Comments    Asleep but awakens easily.  Participated in exercises as described below.  To edge of bed with max a x 1 and some increased resistance from pt today but once sitting does not resist.  She does need min a x 1 to remain sitting for most of the time today with only short periods of self controlled balance.  Max a/dependant +1 transfer to recliner.  Continue to recommend +2 assist with nursing staff.  Pt with no c/o grimacing with L shoulder in regards to mobility to indicate pain.  Daughter in for session and crying regarding her mother and transfer to SNF.  Encouragement given and expectations discussed.  Pt fearful of not being able to see her due to Covid restrictions and lack of communication/knowing of SNF staff.  Discussed ways to communicate with staff at facility and to make concerns know.  Encouragement given.   Follow Up Recommendations  SNF     Equipment Recommendations  None recommended by PT    Recommendations for Other Services       Precautions / Restrictions Precautions Precautions: Fall;Posterior Hip Precaution Comments: minimally verbal -dementia Restrictions Weight Bearing Restrictions: Yes LLE Weight Bearing: Weight bearing as tolerated    Mobility  Bed Mobility Overal bed mobility: Needs Assistance Bed Mobility: Supine to Sit;Rolling Rolling: Max assist   Supine to sit: HOB elevated;Max assist     General bed mobility comments: resists transitions today pushing backwards into bed initially but eases after encoragement.  Transfers Overall transfer level: Needs assistance Equipment used: None Transfers: Sit to/from [employer] CorporationStand;Stand Pivot  Transfers Sit to Stand: Max assist;Total assist Stand pivot transfers: Total assist;Max assist       General transfer comment: max a dependant to recliner.  Recommend +2 assist with nursing staff.  Ambulation/Gait             General Gait Details: unable   Stairs             Wheelchair Mobility    Modified Rankin (Stroke Patients Only)       Balance Overall balance assessment: Needs assistance Sitting-balance support: Feet supported Sitting balance-Leahy Scale: Poor Sitting balance - Comments: requires min assist 90% of the time while sitting today with short periods of self controlled sitting. Postural control: Posterior lean Standing balance support: Bilateral upper extremity supported Standing balance-Leahy Scale: Zero                              Cognition Arousal/Alertness: Awake/alert Behavior During Therapy: WFL for tasks assessed/performed Overall Cognitive Status: History of cognitive impairments - at baseline                                        Exercises Other Exercises Other Exercises: supine LLE AAROM for ankle pumps, heels slides, ab/add x 10 min    General Comments        Pertinent Vitals/Pain Pain Assessment: Faces Faces Pain Scale: Hurts a little bit Pain Location: L hip  - seem sgenerally comfortable Pain Descriptors / Indicators: Grimacing;Guarding  Home Living                      Prior Function            PT Goals (current goals can now be found in the care plan section) Progress towards PT goals: Progressing toward goals    Frequency    BID      PT Plan Current plan remains appropriate    Co-evaluation              AM-PAC PT "6 Clicks" Mobility   Outcome Measure  Help needed turning from your back to your side while in a flat bed without using bedrails?: Total Help needed moving from lying on your back to sitting on the side of a flat bed without using  bedrails?: A Lot Help needed moving to and from a bed to a chair (including a wheelchair)?: Total Help needed standing up from a chair using your arms (e.g., wheelchair or bedside chair)?: Total Help needed to walk in hospital room?: Total Help needed climbing 3-5 steps with a railing? : Total 6 Click Score: 7    End of Session Equipment Utilized During Treatment: Gait belt Activity Tolerance: Patient tolerated treatment well Patient left: in chair;with call bell/phone within reach;with chair alarm set;with family/visitor present   Pain - Right/Left: Left Pain - part of body: Hip     Time: 0623-7628 PT Time Calculation (min) (ACUTE ONLY): 23 min  Charges:  $Therapeutic Exercise: 8-22 mins $Therapeutic Activity: 8-22 mins                    Chesley Noon, PTA 05/13/19, 11:19 AM

## 2019-05-13 NOTE — Progress Notes (Signed)
[place] [facility] [place] NAME: Sydney Turner    MR#:  [id number]  DATE OF BIRTH:  [birth year]  SUBJECTIVE:  CHIEF COMPLAINT:   Chief Complaint  Patient presents with  . Fall  . Hip Pain   - daughter at bedside, patient demented, denies any complaints -She is currently from [place] and came in after fall and left hip fracture.  Postop day 4 today  REVIEW OF SYSTEMS:  Review of Systems  Unable to perform ROS: Dementia    DRUG ALLERGIES:   Allergies  Allergen Reactions  . Sulfa Antibiotics Swelling    Edema  . Amoxicillin     Has patient had a PCN reaction causing immediate rash, facial/tongue/throat swelling, SOB or lightheadedness with hypotension: Unknown Has patient had a PCN reaction causing severe rash involving mucus membranes or skin necrosis: Unknown Has patient had a PCN reaction that required hospitalization: Unknown Has patient had a PCN reaction occurring within the last 10 years: Unknown If all of the above answers are "NO", then may proceed with Cephalosporin use.   Marland Kitchen Penicillins Nausea And Vomiting    Has patient had a PCN reaction causing immediate rash, facial/tongue/throat swelling, SOB or lightheadedness with hypotension: Unknown Has patient had a PCN reaction causing severe rash involving mucus membranes or skin necrosis: Unknown Has patient had a PCN reaction that required hospitalization: Unknown Has patient had a PCN reaction occurring within the last 10 years: Unknown If all of the above answers are "NO", then may proceed with Cephalosporin use.     VITALS:  Blood pressure 133/68, pulse (!) 106, temperature 98.2 F (36.8 C), temperature source Oral, resp. rate 18, height 4\' 8"  (1.422 m), weight 54.4 kg, SpO2 98 %.  PHYSICAL EXAMINATION:  Physical Exam   GENERAL:  78 y.o.-year-old elderly patient lying in the bed with no acute distress.  EYES: Pupils equal, round, reactive to light and  accommodation. No scleral icterus. Extraocular muscles intact.  HEENT: Head atraumatic, normocephalic. Oropharynx and nasopharynx clear.  NECK:  Supple, no jugular venous distention. No thyroid enlargement, no tenderness.  LUNGS: Normal breath sounds bilaterally, no wheezing, rales,rhonchi or crepitation. No use of accessory muscles of respiration. Decreased bibasilar breath sounds present CARDIOVASCULAR: S1, S2 normal. No  rubs, or gallops. 2/6 systolic murmur in place ABDOMEN: Soft, nontender, nondistended. Bowel sounds present. No organomegaly or mass.  EXTREMITIES: No pedal edema, cyanosis, or clubbing. Left hip dressing in place NEUROLOGIC: Cranial nerves II through XII are intact. Muscle strength 5/5 in all extremities. Sensation intact. Gait not checked.  PSYCHIATRIC: The patient is alert but remains nonverbal mostly SKIN: No obvious rash, lesion, or ulcer.    LABORATORY PANEL:   CBC Recent Labs  Lab 05/12/19 0352  WBC 8.0  HGB 9.8*  HCT 30.0*  PLT 198   ------------------------------------------------------------------------------------------------------------------  Chemistries  Recent Labs  Lab 05/11/19 0339  NA 138  K 4.2  CL 105  CO2 29  GLUCOSE 91  BUN 13  CREATININE 0.50  CALCIUM 9.0   ------------------------------------------------------------------------------------------------------------------  Cardiac Enzymes No results for input(s): TROPONINI in the last 168 hours. ------------------------------------------------------------------------------------------------------------------  RADIOLOGY:  No results found.  EKG:   Orders placed or performed during the hospital encounter of 05/08/19  . EKG 12-Lead  . EKG 12-Lead    ASSESSMENT AND PLAN:   [name] a78 y.o.femalewith a known history of Alzheimer's dementia and osteoporosis who presented from [place] with left hip pain after a witnessed fall  a couple of days ago. At  baseline, patient is able to walk without an assistive device.   *Left hip fracture s/p mechanical fall -Left hip x-ray with displaced left femoral neck fracture -Orthopedic surgery consultation with Dr. Martha ClanKrasinski appreciated.  -Continue patient is postop day # 4 -Pain control-only on tramadol -Not receiving IV fluids.  Daughter feeding patient mostly from bedside -PT consult noted-rehab at discharge  *Hypothyroidism-stable -Continue home Synthroid  *Alzheimer's dementia-stable -Trazodone, Seroquel discontinued per daughter's request as patient is more sleepy -Supportive care -Patient has had good days and bad days even prior to admission where she sleeps some days, reassured daughter that she has not received any sedatives last night.  *Severe protein calorie malnutrition-patient was followed by hospice at [facility] -Muscle wasting, poor poor intake -Will be revoked now so that physical therapy can follow-up  *DVT prophylaxis-Lovenox  Awaiting insurance authorization for patient to be discharged to rehab.        All the records are reviewed and case discussed with Care Management/Social Workerr. Management plans discussed with the patient, family and they are in agreement.  CODE STATUS:  DNR  TOTAL TIME TAKING CARE OF THIS PATIENT: 38 minutes.   POSSIBLE D/C IN 2 DAYS, DEPENDING ON CLINICAL CONDITION.   Sydney Turner M.D on 05/13/2019 at 2:02 PM  Between 7am to 6pm - Pager - [phone]  After 6pm go to [email or web address] - Social research officer, governmentpassword [email or web address] [email or web address]  [facility]  Office  [phone]  CC: Primary care physician; [facility]

## 2019-05-13 NOTE — Progress Notes (Signed)
  Subjective:  Patient reports pain as mild.  Patient is having pain with her left shoulder from fall.  Please make all staff aware when handling patient.  Objective:   VITALS:   Vitals:   05/12/19 1527 05/12/19 1701 05/12/19 2329 05/13/19 0813  BP: 99/81 (!) 107/51 125/68 133/68  Pulse: (!) 104 (!) 101 (!) 107 (!) 106  Resp: 18     Temp: 98 F (36.7 C)  99.1 F (37.3 C) 98.2 F (36.8 C)  TempSrc: Axillary  Axillary Oral  SpO2: 98%  94% 98%  Weight:      Height:        PHYSICAL EXAM:  Neurologically intact ABD soft Neurovascular intact Sensation intact distally Intact pulses distally Dorsiflexion/Plantar flexion intact Incision: dressing changed No cellulitis present Compartment soft  LABS  No results found for this or any previous visit (from the past 24 hour(s)).  No results found.  Assessment/Plan: 4 Days Post-Op   Active Problems:   Closed left hip fracture (HCC)   Advance diet Up with therapy Discharge to SNF when bed available  Patient remained stable postop.  She is doing well from orthopedic standpoint.  Patient is weightbearing as tolerated on the left lower extremity.  She should continue to observe posterior hip precautions.  Continue to use hip abduction pillow while in bed.  Patient may be discharged to skilled nursing facility from an orthopedic standpoint when cleared by medicine.  Patient will follow-up with Dr. Mack Guise in the office in approximately 2 weeks after discharge. [address] for DVT prophylaxis x 4 weeks.  Carlynn Spry , PA-C 05/13/2019, 10:34 AM

## 2019-05-13 NOTE — Progress Notes (Signed)
Physical Therapy Treatment Patient Details Name: Sydney Turner MRN: [id number] DOB: 09-Feb-1941 Today's Date: 05/13/2019    History of Present Illness Pt is a 78 year old female admitted s/p L posterior approach hemiarthroplasty.  PMH includes baseline dementia, osteoporosis, aphasia and dementia.    PT Comments    Pt sleeping in chair. Has been up for 4 hours with good tolerance.  Pt resisting mobility this session possibly due to lethargy but pt seemed awake once given time.  Transferred with +2 assist for staff safety due to post lean/push.  During transfer, pt with large formed BM.  Care provided with +2 assist as she continued to resist rolling for care.  Abd pillow donned in bed.   Follow Up Recommendations  SNF     Equipment Recommendations  None recommended by PT    Recommendations for Other Services       Precautions / Restrictions Precautions Precautions: Fall;Posterior Hip Precaution Comments: minimally verbal -dementia Restrictions Weight Bearing Restrictions: Yes LLE Weight Bearing: Weight bearing as tolerated    Mobility  Bed Mobility Overal bed mobility: Needs Assistance Bed Mobility: Supine to Sit;Rolling Rolling: Max assist   Supine to sit: HOB elevated;Max assist Sit to supine: HOB elevated;Max assist   General bed mobility comments: resists transitions today pushing backwards into bed initially but eases after encoragement.  Transfers Overall transfer level: Needs assistance Equipment used: None Transfers: Sit to/from [facility] Sit to Stand: Max assist;Total assist Stand pivot transfers: Total assist;Max assist;+2 physical assistance       General transfer comment: max a dependant to recliner.  Recommend +2 assist with nursing staff.  Ambulation/Gait             General Gait Details: unable   Stairs             Wheelchair Mobility    Modified Rankin (Stroke Patients Only)       Balance Overall  balance assessment: Needs assistance Sitting-balance support: Feet supported Sitting balance-Leahy Scale: Poor Sitting balance - Comments: requires min assist 90% of the time while sitting today with short periods of self controlled sitting. Postural control: Posterior lean Standing balance support: Bilateral upper extremity supported Standing balance-Leahy Scale: Zero                              Cognition Arousal/Alertness: Awake/alert Behavior During Therapy: WFL for tasks assessed/performed Overall Cognitive Status: History of cognitive impairments - at baseline                                        Exercises Other Exercises Other Exercises: supine LLE AAROM for ankle pumps, heels slides, ab/add x 10 min    General Comments        Pertinent Vitals/Pain Pain Assessment: Faces Faces Pain Scale: Hurts a little bit Pain Location: L hip  - seem sgenerally comfortable Pain Descriptors / Indicators: Grimacing;Guarding    Home Living                      Prior Function            PT Goals (current goals can now be found in the care plan section) Progress towards PT goals: Progressing toward goals    Frequency    BID      PT Plan Current plan  remains appropriate    Co-evaluation              AM-PAC PT "6 Clicks" Mobility   Outcome Measure  Help needed turning from your back to your side while in a flat bed without using bedrails?: Total Help needed moving from lying on your back to sitting on the side of a flat bed without using bedrails?: A Lot Help needed moving to and from a bed to a chair (including a wheelchair)?: Total Help needed standing up from a chair using your arms (e.g., wheelchair or bedside chair)?: Total Help needed to walk in hospital room?: Total Help needed climbing 3-5 steps with a railing? : Total 6 Click Score: 7    End of Session Equipment Utilized During Treatment: Gait belt Activity  Tolerance: Patient tolerated treatment well Patient left: in chair;with call bell/phone within reach;with chair alarm set;with family/visitor present   Pain - Right/Left: Left Pain - part of body: Hip     Time: 1315-1330 PT Time Calculation (min) (ACUTE ONLY): 15 min  Charges:  $Therapeutic Exercise: 8-22 mins $Therapeutic Activity: 8-22 mins                    Danielle DessSarah Salina Stanfield, PTA 05/13/19, 2:47 PM

## 2019-05-14 LAB — BASIC METABOLIC PANEL
Anion gap: 8 (ref 5–15)
BUN: 17 mg/dL (ref 8–23)
CO2: 29 mmol/L (ref 22–32)
Calcium: 8.9 mg/dL (ref 8.9–10.3)
Chloride: 101 mmol/L (ref 98–111)
Creatinine, Ser: 0.42 mg/dL — ABNORMAL LOW (ref 0.44–1.00)
GFR calc Af Amer: >60 mL/min (ref >60)
GFR calc non Af Amer: >60 mL/min (ref >60)
Glucose, Bld: 112 mg/dL — ABNORMAL HIGH (ref 70–99)
Potassium: 4.4 mmol/L (ref 3.5–5.1)
Sodium: 138 mmol/L (ref 135–145)

## 2019-05-14 LAB — CBC
HCT: 30.8 % — ABNORMAL LOW (ref 36.0–46.0)
Hemoglobin: 10 g/dL — ABNORMAL LOW (ref 12.0–15.0)
MCH: 31.9 pg (ref 26.0–34.0)
MCHC: 32.5 g/dL (ref 30.0–36.0)
MCV: 98.4 fL (ref 80.0–100.0)
Platelets: 298 10*3/uL (ref 150–400)
RBC: 3.13 MIL/uL — ABNORMAL LOW (ref 3.87–5.11)
RDW: 13.2 % (ref 11.5–15.5)
WBC: 5.7 10*3/uL (ref 4.0–10.5)
nRBC: 0 % (ref 0.0–0.2)

## 2019-05-14 NOTE — Progress Notes (Signed)
[facility] NAME: Sydney Turner    MR#:  [id number]  DATE OF BIRTH:  04/08/1941  SUBJECTIVE:  CHIEF COMPLAINT:   Chief Complaint  Patient presents with   Fall   Hip Pain   - daughter at bedside, patient demented. Pleasant and alert, not following commands. -She is  from [place] and came in after fall and left hip fracture.  Postop day 5 today  REVIEW OF SYSTEMS:  Review of Systems  Unable to perform ROS: Dementia    DRUG ALLERGIES:   Allergies  Allergen Reactions   Sulfa Antibiotics Swelling    Edema   Amoxicillin     Has patient had a PCN reaction causing immediate rash, facial/tongue/throat swelling, SOB or lightheadedness with hypotension: Unknown Has patient had a PCN reaction causing severe rash involving mucus membranes or skin necrosis: Unknown Has patient had a PCN reaction that required hospitalization: Unknown Has patient had a PCN reaction occurring within the last 10 years: Unknown If all of the above answers are "NO", then may proceed with Cephalosporin use.    Penicillins Nausea And Vomiting    Has patient had a PCN reaction causing immediate rash, facial/tongue/throat swelling, SOB or lightheadedness with hypotension: Unknown Has patient had a PCN reaction causing severe rash involving mucus membranes or skin necrosis: Unknown Has patient had a PCN reaction that required hospitalization: Unknown Has patient had a PCN reaction occurring within the last 10 years: Unknown If all of the above answers are "NO", then may proceed with Cephalosporin use.     VITALS:  Blood pressure 137/64, pulse 94, temperature 97.6 F (36.4 C), temperature source Oral, resp. rate 16, height 4\' 8"  (1.422 m), weight 54.4 kg, SpO2 99 %.  PHYSICAL EXAMINATION:  Physical Exam   GENERAL:  78 y.o.-year-old elderly patient lying in the bed with no acute distress.  EYES: Pupils equal, round, reactive to light and  accommodation. No scleral icterus. Extraocular muscles intact.  HEENT: Head atraumatic, normocephalic. Oropharynx and nasopharynx clear.  NECK:  Supple, no jugular venous distention. No thyroid enlargement, no tenderness.  LUNGS: Normal breath sounds bilaterally, no wheezing, rales,rhonchi or crepitation. No use of accessory muscles of respiration. Decreased bibasilar breath sounds present CARDIOVASCULAR: S1, S2 normal. No  rubs, or gallops. 2/6 systolic murmur in place ABDOMEN: Soft, nontender, nondistended. Bowel sounds present. No organomegaly or mass.  EXTREMITIES: No pedal edema, cyanosis, or clubbing. Left hip dressing in place with no bleeding or edema NEUROLOGIC: Cranial nerves II through XII are intact. Muscle strength equal in all extremities. Sensation intact. Gait not checked.  PSYCHIATRIC: The patient is alert, but making eye contact today and answering yes to most of the questions and smiling SKIN: No obvious rash, lesion, or ulcer.    LABORATORY PANEL:   CBC Recent Labs  Lab 05/14/19 0421  WBC 5.7  HGB 10.0*  HCT 30.8*  PLT 298   ------------------------------------------------------------------------------------------------------------------  Chemistries  Recent Labs  Lab 05/14/19 0421  NA 138  K 4.4  CL 101  CO2 29  GLUCOSE 112*  BUN 17  CREATININE 0.42*  CALCIUM 8.9   ------------------------------------------------------------------------------------------------------------------  Cardiac Enzymes No results for input(s): TROPONINI in the last 168 hours. ------------------------------------------------------------------------------------------------------------------  RADIOLOGY:  No results found.  EKG:   Orders placed or performed during the hospital encounter of 05/08/19   EKG 12-Lead   EKG 12-Lead    ASSESSMENT AND PLAN:   [name] a77 y.o.femalewith a known history  of Alzheimer's dementia and osteoporosis who presented from  [facility] with left hip pain after a witnessed fall a couple of days ago. At baseline, patient is able to walk without an assistive device.   *Left hip fracture s/p mechanical fall -Left hip x-ray with displaced left femoral neck fracture -Orthopedic surgery consultation with Dr. Martha ClanKrasinski appreciated.  -Continue patient is postop day # 5 -Pain control-only on tramadol-but has not received any.  Only taking Tylenol here -Not receiving IV fluids.  Daughter feeding patient mostly from bedside -PT consult noted-rehab at discharge  *Hypothyroidism-stable -Continue home Synthroid  *Alzheimer's dementia-stable -Trazodone, Seroquel discontinued per daughter's request as patient was more sleepy-patient is more alert and pleasantly smiling this morning -Supportive care -Patient has had good days and bad days even prior to admission where she sleeps some days, reassured daughter that she has not received any sedatives in the last 24 hours.  *Severe protein calorie malnutrition-patient was followed by hospice at [facility] -Muscle wasting, poor poor intake -Will be revoked now so that physical therapy can follow-up  *DVT prophylaxis-Lovenox  Awaiting insurance authorization for patient to be discharged to rehab.  [facility]    All the records are reviewed and case discussed with Care Management/Social Workerr. Management plans discussed with the patient, family and they are in agreement.  CODE STATUS:  DNR  TOTAL TIME TAKING CARE OF THIS PATIENT: 36 minutes.   POSSIBLE D/C IN 1-2 DAYS, DEPENDING ON CLINICAL CONDITION.   Enid Baasadhika Dalynn Turner M.D on 05/14/2019 at 9:46 AM  Between 7am to 6pm - Pager - [phone]  After 6pm go to [email or web address] - Social research officer, governmentpassword EPAS [facility]  [facility]  Office  [phone]  CC: Primary care physician; [facility]

## 2019-05-14 NOTE — Progress Notes (Signed)
  Subjective:  Patient is non-verbal. Her daughter is present.  Objective:   VITALS:   Vitals:   05/13/19 0813 05/13/19 1604 05/13/19 2246 05/14/19 0755  BP: 133/68 (!) 137/51 (!) 142/59 137/64  Pulse: (!) 106 (!) 105 95 94  Resp:   16   Temp: 98.2 F (36.8 C) 99.8 F (37.7 C) 99.1 F (37.3 C) 97.6 F (36.4 C)  TempSrc: Oral Oral  Oral  SpO2: 98% 97% 99% 99%  Weight:      Height:        PHYSICAL EXAM:  ABD soft Neurovascular intact Incision: dressing C/D/I Compartment soft  LABS  Results for orders placed or performed during the hospital encounter of 05/08/19 (from the past 24 hour(s))  CBC     Status: Abnormal   Collection Time: 05/14/19  4:21 AM  Result Value Ref Range   WBC 5.7 4.0 - 10.5 K/uL   RBC 3.13 (L) 3.87 - 5.11 MIL/uL   Hemoglobin 10.0 (L) 12.0 - 15.0 g/dL   HCT 30.8 (L) 36.0 - 46.0 %   MCV 98.4 80.0 - 100.0 fL   MCH 31.9 26.0 - 34.0 pg   MCHC 32.5 30.0 - 36.0 g/dL   RDW 13.2 11.5 - 15.5 %   Platelets 298 150 - 400 K/uL   nRBC 0.0 0.0 - 0.2 %  Basic metabolic panel     Status: Abnormal   Collection Time: 05/14/19  4:21 AM  Result Value Ref Range   Sodium 138 135 - 145 mmol/L   Potassium 4.4 3.5 - 5.1 mmol/L   Chloride 101 98 - 111 mmol/L   CO2 29 22 - 32 mmol/L   Glucose, Bld 112 (H) 70 - 99 mg/dL   BUN 17 8 - 23 mg/dL   Creatinine, Ser 0.42 (L) 0.44 - 1.00 mg/dL   Calcium 8.9 8.9 - 10.3 mg/dL   GFR calc non Af Amer >60 >60 mL/min   GFR calc Af Amer >60 >60 mL/min   Anion gap 8 5 - 15    No results found.  Assessment/Plan: 5 Days Post-Op   Active Problems:   Closed left hip fracture (HCC)   Advance diet Up with therapy Discharge to SNF when bed available  Patient remained stable postop. She is doing well from orthopedic standpoint. Patient is weightbearing as tolerated on the left lower extremity. She should continue to observe posterior hip precautions. Continue to use hip abduction pillow while in bed. Patient may be  discharged to skilled nursing facility from an orthopedic standpoint when cleared by medicine. Patient will follow-up with Dr. Mack Guise in the office in approximately 2 weeks after discharge. [address] for DVT prophylaxis x 4 weeks.  Lovell Sheehan , MD 05/14/2019, 10:13 AM

## 2019-05-14 NOTE — Plan of Care (Signed)

## 2019-05-14 NOTE — Progress Notes (Signed)
Physical Therapy Treatment Patient Details Name: Sydney GumMargaret K Sine MRN: [id number] DOB: 07-19-1941 Today's Date: 05/14/2019    History of Present Illness Pt is a 78 year old female admitted s/p L posterior approach hemiarthroplasty.  PMH includes baseline dementia, osteoporosis, aphasia and dementia.    PT Comments    Pt is minimally verbal, but talked with daughter about her ability to see pt on phone or ipad in SNF.  Her mother is likely to go to a local SNF and will be able to see her outside the window or electronically.  Pt is up in chair with abd cushion in and talked with CNA about return to bed with second staff member.  Follow acutely to progress her to as much standing as possible, and will transition to SNF when able.   Follow Up Recommendations  SNF     Equipment Recommendations  None recommended by PT    Recommendations for Other Services       Precautions / Restrictions Precautions Precautions: Fall;Posterior Hip Restrictions Weight Bearing Restrictions: Yes LLE Weight Bearing: Weight bearing as tolerated    Mobility  Bed Mobility Overal bed mobility: Needs Assistance Bed Mobility: Supine to Sit Rolling: Max assist   Supine to sit: Max assist     General bed mobility comments: pt is resisting upright sitting most of the time  Transfers Overall transfer level: Needs assistance Equipment used: None Transfers: Stand Pivot Transfers;Sit to/from Stand Sit to Stand: Max assist;Total assist Stand pivot transfers: Max assist;Total assist       General transfer comment: pt is 80-90% dependent  Ambulation/Gait             General Gait Details: unable   Stairs             Wheelchair Mobility    Modified Rankin (Stroke Patients Only)       Balance     Sitting balance-Leahy Scale: Poor       Standing balance-Leahy Scale: Zero                              Cognition Arousal/Alertness: Awake/alert Behavior During  Therapy: WFL for tasks assessed/performed Overall Cognitive Status: History of cognitive impairments - at baseline Area of Impairment: Following commands;Awareness;Problem solving                       Following Commands: Follows one step commands inconsistently;Follows one step commands with increased time   Awareness: Intellectual Problem Solving: Slow processing;Decreased initiation;Difficulty sequencing;Requires verbal cues;Requires tactile cues        Exercises      General Comments        Pertinent Vitals/Pain Pain Assessment: Faces Faces Pain Scale: Hurts a little bit Pain Location: L hip  - seem sgenerally comfortable Pain Descriptors / Indicators: Operative site guarding    Home Living                      Prior Function            PT Goals (current goals can now be found in the care plan section) Acute Rehab PT Goals Patient Stated Goal: none stated Progress towards PT goals: Progressing toward goals    Frequency    BID      PT Plan Current plan remains appropriate    Co-evaluation              AM-PAC  PT "6 Clicks" Mobility   Outcome Measure  Help needed turning from your back to your side while in a flat bed without using bedrails?: Total Help needed moving from lying on your back to sitting on the side of a flat bed without using bedrails?: A Lot Help needed moving to and from a bed to a chair (including a wheelchair)?: Total Help needed standing up from a chair using your arms (e.g., wheelchair or bedside chair)?: Total Help needed to walk in hospital room?: Total Help needed climbing 3-5 steps with a railing? : Total 6 Click Score: 7    End of Session Equipment Utilized During Treatment: Gait belt Activity Tolerance: Treatment limited secondary to medical complications (Comment);Other (comment)(cognition) Patient left: in chair;with call bell/phone within reach;with chair alarm set;with family/visitor present Nurse  Communication: Mobility status;Other (comment)(2 person help to return to bed) Pain - Right/Left: Left Pain - part of body: Hip     Time: 1413-1440 PT Time Calculation (min) (ACUTE ONLY): 27 min  Charges:  $Therapeutic Activity: 23-37 mins                    Ramond Dial 05/14/2019, 4:57 PM   Mee Hives, PT MS Acute Rehab Dept. Number: [facility] [place]

## 2019-05-15 LAB — SARS CORONAVIRUS 2 BY RT PCR (HOSPITAL ORDER, PERFORMED IN ~~LOC~~ HOSPITAL LAB): SARS Coronavirus 2: NEGATIVE

## 2019-05-15 MED ORDER — ENOXAPARIN SODIUM 40 MG/0.4ML ~~LOC~~ SOLN: 40.0000 mg | SUBCUTANEOUS | 0 refills | Status: DC

## 2019-05-15 MED ORDER — POLYETHYLENE GLYCOL 3350 17 G PO PACK: 17.0000 g | PACK | Freq: Every day | ORAL | 0 refills | Status: DC | PRN

## 2019-05-15 MED ORDER — TRAMADOL HCL 50 MG PO TABS: 50.0000 mg | ORAL_TABLET | Freq: Four times a day (QID) | ORAL | 0 refills | Status: DC | PRN

## 2019-05-15 NOTE — Progress Notes (Addendum)
PT Cancellation Note  Patient Details Name: Sydney Turner MRN: [id number] DOB: September 17, 1941   Cancelled Treatment:    Reason Eval/Treat Not Completed: Other (comment). Treatment attempted. Initial attempt ST working with pt; assisted ST with repositioning pt for feeding. Treatment attempted twice at a later time. Pt/family with other staff. Per SW pt has been approved for skilled nursing facility and will discharge to skilled nursing facility today. Treatment attempted again in the afternoon, as pt awaiting test result prior to discharging to skilled nursing facility and pt soundly sleeping; unable to awaken.    Larae Grooms, PTA 05/15/2019, 12:15 PM

## 2019-05-15 NOTE — Evaluation (Signed)
Clinical/Bedside Swallow Evaluation Patient Details  Name: Sydney Turner: [id number] Date of Birth: 1941-06-07  Today's Date: 05/15/2019 Time: SLP Start Time (ACUTE ONLY): 1055 SLP Stop Time (ACUTE ONLY): 1215 SLP Time Calculation (min) (ACUTE ONLY): 80 min  Past Medical History:  Past Medical History:  Diagnosis Date  . 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 Aphasia   . Dementia (HCC)   . Dementia with behavioral disturbance (HCC)   . Osteoporosis    Past Surgical History:  Past Surgical History:  Procedure Laterality Date  . CATARACT EXTRACTION    . COLONOSCOPY    . HIP ARTHROPLASTY Left 05/09/2019   Procedure: ARTHROPLASTY BIPOLAR HIP (HEMIARTHROPLASTY);  Surgeon: Juanell FairlyKrasinski, Kevin, MD;  Location: [facility] ORS;  Service: Orthopedics;  Laterality: Left;   HPI:  Pt is a 78 y.o. female with a known history of Alzheimer's Dementia w/ Primary Progressive Aphasia, and osteoporosis who presented from [place] [facility] with left hip pain after a witnessed fall a couple of days ago.  At baseline, patient is able to walk without an assistive device.  However, since her fall she has primarily been in a wheelchair.  She would not let anyone touch her left leg.  Per daughter, she is not very verbal but she recognizes her family.  She does require some assistance with feeding.  In the ED, vitals and labs were unremarkable.  Left hip x-ray showed a displaced left femoral neck fracture.  CT head was unremarkable.  Dtr stated she has noted oral phase issues when pt eats at meals(min longer chewing, min longer oral time).  No overt coughing noted though.    Assessment / Plan / Recommendation Clinical Impression  Pt appears to present w/ adequate oropharyngeal phase swallow function at this evaluation today at bedside. Pt does have a baseline of Advanced Dementia w/ the Cognitive decline having impact on the oral phase at times w/ certain foods PRIOR to this hospitalization,  per Dtr's report. This is potentially an issue that could be expected. Education (general) was given to Dtr on the impact of Dementia on the oral phase of eating/drinking, swallowing, and overall oral intake (desires for foods, drink; consistencies of food, drink).  Pt required full assistance for positioning upright in bed (Pillow LOW behind back for support). It is necessary that pt be midline and not crunched down in the bed/chair when eating/drinking - this also allows her to help feed herself. Pt consumed trials of thin liquids via cup(attempted straw but was confused by its use), puree, and soft solids. NO overt s/s of aspiration noted; no decline in respiratory status noted during/post po trials, and laryngeal excursion was wnl during the swallow. Unable to assess vocal quality d/t nonverbal status. During the oral phase, pt demonstrated adequate mastication and chewing of ice chips and the soft solids w/ timely swallowing and oral clearing. No pocketing or excess mastication/oral phase time noted w/ the consistencies at this bedside eval. However, it would be expected that different textures of foods could impact pt's perception and increase the chewing/oral phase time. This was discussed w/ Dtr as well as strategies to address it such as moistening foods w/ condiments, alternating foods and foods w/ liquids. No oral phase deficits noted w/ trials of thin liquids or puree. Of note, pt did chew on the straw d/t confusion HOWEVER she drank easily knowing function of the Cup - STRAWS are NOT recommended as pt can Hold Cup to drink independently. OM exam  was cursory - she exhibited adequate strength/ROM w/ bolus manipulation and clearing. Cues needed but pt could also feed self Finger Foods(recommended further in her diet).  Due to pt's overall presentation, rec. Dysphagia level 3 (cut meats); Thin liquids. Cut meats; not minced. Add gravy for extra moisture. Add a Soup at [employer]; Yogurt TID meals;  puddings and ice creams.  Foods of her preference, and also finger foods when able. Recommend small, frequent meals for pt to graze with. Reduce distractions. Pills in Puree - Crushed for easier, safer swallowing. Let pt Hold Cup when drinking, but help w/ feeding of foods.  SLP Visit Diagnosis: Dysphagia, oral phase (R13.11)(min - impact from Advanced Dementia)    Aspiration Risk  (reduced following general aspiration precautions)    Diet Recommendation  Dysphagia level 3 (mech soft) w/ Cut meats, not minced. Add Gravy to moisten. Thin liquids. General aspiration precautions. Add a Soup at [employer]; Yogurt TID meals; puddings and ice creams.  Foods of her preference, and also finger foods when able. Recommend small, frequent meals for pt to graze with. Reduce distractions. Let pt Hold Cup when drinking, but help w/ feeding of foods. Needs correct positioning supported w/ pillow low behind back in chair/bed for eating/drinking.  Medication Administration: Crushed with puree(as able d/t Advanced Dementia)    Other  Recommendations Recommended Consults: (dietician consult for drink supplements) Oral Care Recommendations: Oral care BID;Staff/trained caregiver to provide oral care Other Recommendations: (n/a)   Follow up Recommendations None      Frequency and Duration (n/a)  (n/a)       Prognosis Prognosis for Safe Diet Advancement: Fair(-Good) Barriers to Reach Goals: Cognitive deficits;Time post onset;Severity of deficits      Swallow Study   General Date of Onset: 05/08/19 HPI: Pt is a 78 y.o. female with a known history of Alzheimer's Dementia w/ Primary Progressive Aphasia, and osteoporosis who presented from [place] with left hip pain after a witnessed fall a couple of days ago.  At baseline, patient is able to walk without an assistive device.  However, since her fall she has primarily been in a wheelchair.  She would not let anyone touch her left leg.  Per daughter,  she is not very verbal but she recognizes her family.  She does require some assistance with feeding.  In the ED, vitals and labs were unremarkable.  Left hip x-ray showed a displaced left femoral neck fracture.  CT head was unremarkable.  Dtr stated she has noted oral phase issues when pt eats at meals(min longer chewing, min longer oral time).  No overt coughing noted though.  Type of Study: Bedside Swallow Evaluation Previous Swallow Assessment: none - though NH has been mincing meats per Dtr's description Diet Prior to this Study: Dysphagia 3 (soft);Thin liquids Temperature Spikes Noted: No(wbc 5.7) Respiratory Status: Room air History of Recent Intubation: No Behavior/Cognition: Alert;Cooperative;Pleasant mood;Confused;Distractible;Requires cueing;Doesn't follow directions(needs cues baseline; nonverbal at baseline) Oral Cavity Assessment: Within Functional Limits Oral Care Completed by SLP: Recent completion by staff Oral Cavity - Dentition: Adequate natural dentition Vision: Impaired for self-feeding(depth perception issues per Dtr) Self-Feeding Abilities: Able to feed self;Needs assist;Needs set up;Total assist(can Hold Cup to drink; finger foods) Patient Positioning: Upright in bed(needed positioning) Baseline Vocal Quality: (nonverbal) Volitional Cough: Cognitively unable to elicit Volitional Swallow: Unable to elicit    Oral/Motor/Sensory Function Overall Oral Motor/Sensory Function: Within functional limits(w/ bolus manipulation and clearing)   Ice Chips Ice chips: Within functional limits Presentation: Spoon(fed;  2 trials) Other Comments: munched and chewed the ice chips; timely swallowing/clearing   Thin Liquid Thin Liquid: Within functional limits Presentation: Cup;Self Fed(~10+ ozs total) Other Comments: confused by use of straw; timely swallows    Nectar Thick Nectar Thick Liquid: Not tested   Honey Thick Honey Thick Liquid: Not tested   Puree Puree: Within functional  limits Presentation: Spoon(fed; 1 trial) Other Comments: did not like the "lite" applesauce per Dtr   Solid     Solid: Within functional limits(graham crackers) Presentation: Self Fed(full cracker sheet) Other Comments: seemed to do well w/ finger foods when placed in hand, cued; timely mastication and oral clearing       Jerilynn SomKatherine Benelli Winther, MS, CCC-SLP Malgorzata Albert 05/15/2019,1:35 PM

## 2019-05-15 NOTE — Progress Notes (Signed)
Visit made. Patient sen lying in bed, alert, appeared comfortable, daughter [name] at bedside. Patient able to Recruitment consultant with a smile, but no verbal interaction. She did doze off and on. Appetite fair, but is drinking ensure regularly. Per chart review she is taking her scheduled oral medications, last PRN dose of tylenol for pain given yesterday afternoon. Plan continues for discharge to [facility] pending insurance authorization. Revocation paper work prepared and will be filed at time of discharge. [name] is aware and remains in agreement. Emotional support provided, will continue to follow and update hospice team.  [name] BSN, RN, [place] [facility] [phone]

## 2019-05-15 NOTE — TOC Transition Note (Signed)
Transition of Care [facility]) - CM/SW Discharge Note   Patient Details  Name: Sydney Turner MRN: [id number] Date of Birth: 07-28-1941  [facility] of [facility]) CM/SW Contact:  Su Hilt, RN Phone Number: 05/15/2019, 12:02 PM   Clinical Narrative:     Patient is to DC to [facility] room 714 once covid test comes back I notified the daughter in the room of the DC and room number and provided [facility] phone number.  She plans to go to the facility at the same time the patient arrives to talk with the staff and fill out forms The patient will transport via EMS once ready and the bedside nurse is to call for transport The bedside nurse will call report to [facility] main number The DNR and RX is in the DC packet on the chart  Final next level of care: [place] Barriers to Discharge: Barriers Resolved   Patient Goals and CMS Choice        Discharge Placement                       Discharge Plan and Services                                     Social Determinants of Health (SDOH) Interventions     Readmission Risk Interventions No flowsheet data found.

## 2019-05-15 NOTE — Plan of Care (Signed)
Patient was much brighter this shift, smiling and more responsive. Patient did answer questions with "Yes"   Problem: Education: Goal: Knowledge of General Education information will improve Description: Including pain rating scale, medication(s)/side effects and non-pharmacologic comfort measures Outcome: Progressing   Problem: Health Behavior/Discharge Planning: Goal: Ability to manage health-related needs will improve Outcome: Progressing   Problem: Clinical Measurements: Goal: Ability to maintain clinical measurements within normal limits will improve Outcome: Progressing Goal: Will remain free from infection Outcome: Progressing Goal: Diagnostic test results will improve Outcome: Progressing Goal: Cardiovascular complication will be avoided Outcome: Progressing   Problem: Activity: Goal: Risk for activity intolerance will decrease Outcome: Progressing   Problem: Nutrition: Goal: Adequate nutrition will be maintained Outcome: Progressing   Problem: Elimination: Goal: Will not experience complications related to bowel motility Outcome: Progressing Goal: Will not experience complications related to urinary retention Outcome: Progressing   Problem: Pain Managment: Goal: General experience of comfort will improve Outcome: Progressing   Problem: Safety: Goal: Ability to remain free from injury will improve Outcome: Progressing   Problem: Skin Integrity: Goal: Risk for impaired skin integrity will decrease Outcome: Progressing

## 2019-05-15 NOTE — Care Management Important Message (Signed)
Important Message  Patient Details  Name: Sydney Turner MRN: [id number] Date of Birth: [birth year]   Medicare Important Message Given:  Yes     Juliann Pulse A Linn Clavin 05/15/2019, 10:59 AM

## 2019-05-15 NOTE — Discharge Summary (Signed)
[facility] [place] [facility]   PATIENT NAME: Sydney Turner    MR#:  [id number]  DATE OF BIRTH:  02-03-1941  DATE OF ADMISSION:  05/08/2019   ADMITTING PHYSICIAN: Campbell Stall, MD  DATE OF DISCHARGE:  05/15/19  PRIMARY CARE PHYSICIAN: [facility]   ADMISSION DIAGNOSIS:   Closed fracture of neck of left femur, initial encounter (HCC) [S72.002A]  DISCHARGE DIAGNOSIS:   Active Problems:   Closed left hip fracture (HCC)   SECONDARY DIAGNOSIS:   Past Medical History:  Diagnosis Date  . 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 Aphasia   . Dementia (HCC)   . Dementia with behavioral disturbance (HCC)   . Osteoporosis     HOSPITAL COURSE:   Sydney Turner a77 y.o.femalewith a known history of Alzheimer's dementia and osteoporosis who presented from [facility] with left hip pain after a witnessed fall a couple of days ago. At baseline, patient is able to walk without an assistive device.   *Left hip fracture s/p mechanical fall -Left hip x-ray with displaced left femoral neck fracture -Orthopedic surgery consultation with Dr. Martha Clan appreciated.  -Continue patient is postop day #6 -Pain control-only on tramadol as needed-but  Only taking Tylenol here -finished receiving IV fluids.  Daughter feeding patient mostly from bedside -PT consultnoted-rehab at discharge-at baseline patient ambulates without any assistance  *Hypothyroidism-stable -Continue home Synthroid  *Alzheimer's dementia-stable -Trazodone, Seroquel, ativan discontinued per daughter's request as patient was more sleepy-patient is more alert and pleasantly smiling this morning -Supportive care -Patient has had good days and bad days even prior to admission where she sleeps some days, reassured daughter that she has not received any sedatives in the last 24 hours.  *Severe protein calorie malnutrition-patient was  followed by hospice at [facility] -Muscle wasting, poor poor intake -Hospice Will be revoked now so that physical therapy can follow-up  *DVT prophylaxis-Lovenox for 3 more weeks per ortho team  Awaiting insurance authorization for patient to be discharged to rehab. Possible [facility]   DISCHARGE CONDITIONS:   Very guarded  CONSULTS OBTAINED:   Orthopedics consultation by Dr. Martha Clan  DRUG ALLERGIES:   Allergies  Allergen Reactions  . Sulfa Antibiotics Swelling    Edema  . Amoxicillin     Has patient had a PCN reaction causing immediate rash, facial/tongue/throat swelling, SOB or lightheadedness with hypotension: Unknown Has patient had a PCN reaction causing severe rash involving mucus membranes or skin necrosis: Unknown Has patient had a PCN reaction that required hospitalization: Unknown Has patient had a PCN reaction occurring within the last 10 years: Unknown If all of the above answers are "NO", then may proceed with Cephalosporin use.   Marland Kitchen Penicillins Nausea And Vomiting    Has patient had a PCN reaction causing immediate rash, facial/tongue/throat swelling, SOB or lightheadedness with hypotension: Unknown Has patient had a PCN reaction causing severe rash involving mucus membranes or skin necrosis: Unknown Has patient had a PCN reaction that required hospitalization: Unknown Has patient had a PCN reaction occurring within the last 10 years: Unknown If all of the above answers are "NO", then may proceed with Cephalosporin use.    DISCHARGE MEDICATIONS:   Allergies as of 05/15/2019      Reactions   Sulfa Antibiotics Swelling   Edema   Amoxicillin    Has patient had a PCN reaction causing immediate rash, facial/tongue/throat swelling, SOB or lightheadedness with hypotension: Unknown Has patient  had a PCN reaction causing severe rash involving mucus membranes or skin necrosis: Unknown Has patient had a PCN reaction that required  hospitalization: Unknown Has patient had a PCN reaction occurring within the last 10 years: Unknown If all of the above answers are "NO", then may proceed with Cephalosporin use.   Penicillins Nausea And Vomiting   Has patient had a PCN reaction causing immediate rash, facial/tongue/throat swelling, SOB or lightheadedness with hypotension: Unknown Has patient had a PCN reaction causing severe rash involving mucus membranes or skin necrosis: Unknown Has patient had a PCN reaction that required hospitalization: Unknown Has patient had a PCN reaction occurring within the last 10 years: Unknown If all of the above answers are "NO", then may proceed with Cephalosporin use.      Medication List    STOP taking these medications   guaifenesin 100 MG/5ML syrup Commonly known as: ROBITUSSIN   loratadine 10 MG tablet Commonly known as: CLARITIN   LORazepam 0.5 MG tablet Commonly known as: ATIVAN   QUEtiapine 50 MG tablet Commonly known as: SEROQUEL   traZODone 50 MG tablet Commonly known as: DESYREL     TAKE these medications   acetaminophen 500 MG tablet Commonly known as: TYLENOL Take 500 mg by mouth every 4 (four) hours as needed for mild pain or fever. What changed: Another medication with the same name was removed. Continue taking this medication, and follow the directions you see here.   Calcium 600+D 600-800 MG-UNIT Tabs Generic drug: Calcium Carb-Cholecalciferol Take 1 tablet daily by mouth.   ciclopirox 8 % solution Commonly known as: PENLAC Apply at bedtime topically. Apply over nail and surrounding skin. Apply daily over previous coat. After seven (7) days, may remove with alcohol and continue cycle.   D3-1000 PO Take 1,000 Units daily by mouth.   docusate sodium 100 MG capsule Commonly known as: COLACE Take 100 mg daily by mouth.   enoxaparin 40 MG/0.4ML injection Commonly known as: LOVENOX Inject 0.4 mLs (40 mg total) into the skin daily for 21 days. Start  taking on: May 16, 2019   levothyroxine 112 MCG tablet Commonly known as: SYNTHROID Take 112 mcg by mouth daily before breakfast.   loperamide 2 MG capsule Commonly known as: IMODIUM Take 2 mg by mouth as needed for diarrhea or loose stools (max 8 doses in 24 hours).   magnesium hydroxide 400 MG/5ML suspension Commonly known as: MILK OF MAGNESIA Take 30 mLs by mouth at bedtime as needed for mild constipation.   Mintox 200-200-20 MG/5ML suspension Generic drug: alum & mag hydroxide-simeth Take 30 mLs by mouth daily as needed for indigestion or heartburn (max 4 doses per 24 hours).   multivitamin tablet Take 1 tablet daily by mouth.   neomycin-bacitracin-polymyxin Oint Commonly known as: NEOSPORIN Apply 1 application topically as needed for wound care.   nitrofurantoin 100 MG capsule Commonly known as: MACRODANTIN Take 100 mg daily by mouth.   polyethylene glycol 17 g packet Commonly known as: MIRALAX / GLYCOLAX Take 17 g by mouth daily as needed for mild constipation.   traMADol 50 MG tablet Commonly known as: ULTRAM Take 1 tablet (50 mg total) by mouth every 6 (six) hours as needed for moderate pain.        DISCHARGE INSTRUCTIONS:   1. PCP f/u in 1-2 weeks 2. Orthopedics f/u in 2 weeks 3. Lovenox for 3 weeks only for DVT prophylaxis  DIET:   Regular diet  ACTIVITY:   Activity as tolerated  OXYGEN:  Home Oxygen: No.  Oxygen Delivery: room air  DISCHARGE LOCATION:   nursing home   If you experience worsening of your admission symptoms, develop shortness of breath, life threatening emergency, suicidal or homicidal thoughts you must seek medical attention immediately by calling 911 or calling your MD immediately  if symptoms less severe.  You Must read complete instructions/literature along with all the possible adverse reactions/side effects for all the Medicines you take and that have been prescribed to you. Take any new Medicines after you have  completely understood and accpet all the possible adverse reactions/side effects.   Please note  You were cared for by a hospitalist during your hospital stay. If you have any questions about your discharge medications or the care you received while you were in the hospital after you are discharged, you can call the unit and asked to speak with the hospitalist on call if the hospitalist that took care of you is not available. Once you are discharged, your primary care physician will handle any further medical issues. Please note that NO REFILLS for any discharge medications will be authorized once you are discharged, as it is imperative that you return to your primary care physician (or establish a relationship with a primary care physician if you do not have one) for your aftercare needs so that they can reassess your need for medications and monitor your lab values.    On the day of Discharge:  VITAL SIGNS:   Blood pressure (!) 129/59, pulse 92, temperature 97.9 F (36.6 C), temperature source Oral, resp. rate 17, height 4\' 8"  (1.422 m), weight 54.4 kg, SpO2 99 %.  PHYSICAL EXAMINATION:    GENERAL:  78 y.o.-year-old elderly patient lying in the bed with no acute distress.  EYES: Pupils equal, round, reactive to light and accommodation. No scleral icterus. Extraocular muscles intact.  HEENT: Head atraumatic, normocephalic. Oropharynx and nasopharynx clear.  NECK:  Supple, no jugular venous distention. No thyroid enlargement, no tenderness.  LUNGS: Normal breath sounds bilaterally, no wheezing, rales,rhonchi or crepitation. No use of accessory muscles of respiration. Decreased bibasilar breath sounds present CARDIOVASCULAR: S1, S2 normal. No  rubs, or gallops. 2/6 systolic murmur in place ABDOMEN: Soft, nontender, nondistended. Bowel sounds present. No organomegaly or mass.  EXTREMITIES: No pedal edema, cyanosis, or clubbing. Left hip dressing in place with no bleeding or edema NEUROLOGIC:  Cranial nerves II through XII are intact. Muscle strength equal in all extremities. Sensation intact. Gait not checked.  PSYCHIATRIC: The patient is alert, but making eye contact today and answering yes to most of the questions and smiling SKIN: No obvious rash, lesion, or ulcer  DATA REVIEW:   CBC Recent Labs  Lab 05/14/19 0421  WBC 5.7  HGB 10.0*  HCT 30.8*  PLT 298    Chemistries  Recent Labs  Lab 05/14/19 0421  NA 138  K 4.4  CL 101  CO2 29  GLUCOSE 112*  BUN 17  CREATININE 0.42*  CALCIUM 8.9     Microbiology Results  Results for orders placed or performed during the hospital encounter of 05/08/19  SARS Coronavirus 2 (CEPHEID - Performed in [facility] hospital lab), Hosp Order     Status: None   Collection Time: 05/08/19  5:04 PM   Specimen: Nasopharyngeal Swab  Result Value Ref Range Status   SARS Coronavirus 2 NEGATIVE NEGATIVE Final    Comment: (NOTE) If result is NEGATIVE SARS-CoV-2 target nucleic acids are NOT DETECTED. The SARS-CoV-2 RNA is generally detectable  in upper and lower  respiratory specimens during the acute phase of infection. The lowest  concentration of SARS-CoV-2 viral copies this assay can detect is 250  copies / mL. A negative result does not preclude SARS-CoV-2 infection  and should not be used as the sole basis for treatment or other  patient management decisions.  A negative result may occur with  improper specimen collection / handling, submission of specimen other  than nasopharyngeal swab, presence of viral mutation(s) within the  areas targeted by this assay, and inadequate number of viral copies  (<250 copies / mL). A negative result must be combined with clinical  observations, patient history, and epidemiological information. If result is POSITIVE SARS-CoV-2 target nucleic acids are DETECTED. The SARS-CoV-2 RNA is generally detectable in upper and lower  respiratory specimens dur ing the acute phase of infection.  Positive   results are indicative of active infection with SARS-CoV-2.  Clinical  correlation with patient history and other diagnostic information is  necessary to determine patient infection status.  Positive results do  not rule out bacterial infection or co-infection with other viruses. If result is PRESUMPTIVE POSTIVE SARS-CoV-2 nucleic acids MAY BE PRESENT.   A presumptive positive result was obtained on the submitted specimen  and confirmed on repeat testing.  While 2019 novel coronavirus  (SARS-CoV-2) nucleic acids may be present in the submitted sample  additional confirmatory testing may be necessary for epidemiological  and / or clinical management purposes  to differentiate between  SARS-CoV-2 and other Sarbecovirus currently known to infect humans.  If clinically indicated additional testing with an alternate test  methodology [phone]([id number]) is advised. The SARS-CoV-2 RNA is generally  detectable in upper and lower respiratory sp ecimens during the acute  phase of infection. The expected result is Negative. Fact Sheet for Patients:  BoilerBrush.com.cyhttps://www.fda.gov/media/136312/download Fact Sheet for Healthcare Providers: https://pope.com/https://www.fda.gov/media/136313/download This test is not yet approved or cleared by the [place] FDA and has been authorized for detection and/or diagnosis of SARS-CoV-2 by FDA under an Emergency Use Authorization (EUA).  This EUA will remain in effect (meaning this test can be used) for the duration of the COVID-19 declaration under Section 564(b)(1) of the Act, 21 U.S.C. section 360bbb-3(b)(1), unless the authorization is terminated or revoked sooner. Performed at [facility], [address] [place], [place] [address]   Surgical pcr screen     Status: None   Collection Time: 05/08/19  8:40 PM   Specimen: Nasal Mucosa; Nasal Swab  Result Value Ref Range Status   MRSA, PCR NEGATIVE NEGATIVE Final   Staphylococcus aureus NEGATIVE NEGATIVE Final    Comment:  (NOTE) The Xpert SA Assay (FDA approved for NASAL specimens in patients 78 years of age and older), is one component of a comprehensive surveillance program. It is not intended to diagnose infection nor to guide or monitor treatment. Performed at [facility], [address] [place], [place] [address]     RADIOLOGY:  No results found.   Management plans discussed with the patient, family and they are in agreement.  CODE STATUS:     Code Status Orders  (From admission, onward)         Start     Ordered   05/08/19 2032  Do not attempt resuscitation (DNR)  Continuous    Question Answer Comment  In the event of cardiac or respiratory ARREST Do not call a "code blue"   In the event of cardiac or respiratory ARREST Do not perform  Intubation, CPR, defibrillation or ACLS   In the event of cardiac or respiratory ARREST Use medication by any route, position, wound care, and other measures to relive pain and suffering. May use oxygen, suction and manual treatment of airway obstruction as needed for comfort.      05/08/19 2031        Code Status History    Date Active Date Inactive Code Status Order ID Comments User Context   02/03/2017 0323 02/03/2017 1748 Full Code [id number]  Harvie Bridge, DO ED   Advance Care Planning Activity    Advance Directive Documentation     Most Recent Value  Type of Advance Directive  Healthcare Power of Attorney  Pre-existing out of facility DNR order (yellow form or pink MOST form)  -  "MOST" Form in Place?  -      TOTAL TIME TAKING CARE OF THIS PATIENT: 38 minutes.    Gladstone Lighter M.D on 05/15/2019 at 8:42 AM  Between 7am to 6pm - Pager - [phone]  After 6pm go to [email or web address] - Proofreader  [facility]  Office  [phone]  CC: Primary care physician; [facility]   Note: This dictation was prepared with Dragon dictation along with smaller phrase technology. Any  transcriptional errors that result from this process are unintentional.

## 2019-05-24 ENCOUNTER — Other Ambulatory Visit: Payer: Self-pay

## 2019-05-24 ENCOUNTER — Non-Acute Institutional Stay: Payer: Medicare Other | Admitting: Primary Care

## 2019-05-25 ENCOUNTER — Non-Acute Institutional Stay: Payer: Medicare Other | Admitting: Primary Care

## 2019-05-25 ENCOUNTER — Other Ambulatory Visit: Payer: Self-pay

## 2019-05-25 DIAGNOSIS — Z515 Encounter for palliative care: Secondary | ICD-10-CM

## 2019-05-25 NOTE — Progress Notes (Signed)
[facility] Pall[facility] Consult Note Telephone: [phone]  Fax: [phone]  TELEHEALTH VISIT STATEMENT Due to the COVID-19 crisis, this visit was done via telemedicine from my office. It was initiated and consented to by this patient and/or family.  PATIENT NAME: Sydney Turner DOB: 1941/03/27 MRN: [id number]  PRIMARY CARE PROVIDER:   Or[facility] Br[facility] [phone]  Dr. Lovie Macadamia at [facility]  REFERRING PROVIDER:  [facility], [facility] [place],  [place] [address] [phone]   Dr. Lovie Macadamia at [facility]   RESPONSIBLE PARTY:   Extended Emergency Contact Information Primary Emergency Contact: Sydney Turner Mobile Phone: [phone] Relation: Daughter Secondary Emergency Contact: Lipsett,sherry Mobile Phone: [phone] Relation: Daughter  Palliative Care was asked to follow this patient by consultation request of Dr Juluis Pitch. This is the initial visit.  ASSESSMENT AND RECOMMENDATIONS:   1. Goals of Care: Maximize quality of life and symptom management.  2. Symptom Management:   Admittted to [facility] for [facility], has walked with support with therapy services. Appears to be comfortable, staff denies constipation and will put on protocol if needed. Weight is 98 lbs with reported weight in ED several months ago of 120 lb but this needs to be corroborated. She has been on hospice in the past and was discharged as stable.  3. Family /Caregiver/Community Supports: Lives at [facility] memory care and is set to return after therapy course. Currently in SNF for rehab after a fall and hip fracture, with repair. Daughter Sydney Turner is POA. I will follow up with calling her for her input.  4. Cognitive / Functional decline: Appears minimally interactive, FAST score of 7b. SNF staff states she does speak a few words but she does not interact on zoom interview. Functionally she needs  heavy assist to be oob. She assists her own feeding with some  Finger foods but is dependent on feeding assistance for full nutrition.  5. Advance Care Directive: MOST uploaded to [facility] from SNF, DNR, Comfort measures, antibiotics & iv fluids if needed, no feeding tube.  6. Follow up Palliative Care Visit: Palliative care will continue to follow for goals of care clarification and symptom management. Return 3-4 weeks or prn.  I spent 25 minutes providing this consultation,  from 1500 to 1525. More than 50% of the time in this consultation was spent coordinating communication.   HISTORY OF PRESENT ILLNESS:  Sydney Turner is a 78 y.o. year old female with multiple medical problems including Alzheimer's dementia, progressive aphasia, osteoporosis. Palliative Care was asked to help address goals of care.   CODE STATUS: DNR, Comfort measures, antibiotics & IV fluids if needed, no feeding tube.  PPS: 30% HOSPICE ELIGIBILITY/DIAGNOSIS: TBD  PAST MEDICAL HISTORY:  Past Medical History:  Diagnosis Date  . Alzheimer's dementia ([place])    With Primary Progressive Aphasia  . Aphasia   . Dementia ([place])   . Dementia with behavioral disturbance ([place])   . Osteoporosis     SOCIAL HX:  Social History   Tobacco Use  . Smoking status: Never Smoker  . Smokeless tobacco: Never Used  Substance Use Topics  . Alcohol use: No    Frequency: Never    ALLERGIES:  Allergies  Allergen Reactions  . Sulfa Antibiotics Swelling    Edema  . Amoxicillin     Has patient had a PCN reaction causing immediate rash, facial/tongue/throat swelling, SOB or lightheadedness with hypotension: Unknown Has patient had a PCN reaction  causing severe rash involving mucus membranes or skin necrosis: Unknown Has patient had a PCN reaction that required hospitalization: Unknown Has patient had a PCN reaction occurring within the last 10 years: Unknown If all of the above answers are "NO", then may proceed with Cephalosporin  use.   Marland Kitchen. Penicillins Nausea And Vomiting    Has patient had a PCN reaction causing immediate rash, facial/tongue/throat swelling, SOB or lightheadedness with hypotension: Unknown Has patient had a PCN reaction causing severe rash involving mucus membranes or skin necrosis: Unknown Has patient had a PCN reaction that required hospitalization: Unknown Has patient had a PCN reaction occurring within the last 10 years: Unknown If all of the above answers are "NO", then may proceed with Cephalosporin use.      PERTINENT MEDICATIONS:  Outpatient Encounter Medications as of 05/25/2019  Medication Sig  . acetaminophen (TYLENOL) 500 MG tablet Take 500 mg by mouth every 4 (four) hours as needed for mild pain or fever.  Marland Kitchen. alum & mag hydroxide-simeth (MINTOX) 200-200-20 MG/5ML suspension Take 30 mLs by mouth daily as needed for indigestion or heartburn (max 4 doses per 24 hours).  . Calcium Carb-Cholecalciferol (CALCIUM 600+D) 600-800 MG-UNIT TABS Take 1 tablet daily by mouth.  . Cholecalciferol (D3-1000 PO) Take 1,000 Units daily by mouth.  . ciclopirox (PENLAC) 8 % solution Apply at bedtime topically. Apply over nail and surrounding skin. Apply daily over previous coat. After seven (7) days, may remove with alcohol and continue cycle.  . docusate sodium (COLACE) 100 MG capsule Take 100 mg daily by mouth.  . enoxaparin (LOVENOX) 40 MG/0.4ML injection Inject 0.4 mLs (40 mg total) into the skin daily for 21 days.  Marland Kitchen. levothyroxine (SYNTHROID, LEVOTHROID) 112 MCG tablet Take 112 mcg by mouth daily before breakfast.  . loperamide (IMODIUM) 2 MG capsule Take 2 mg by mouth as needed for diarrhea or loose stools (max 8 doses in 24 hours).   . magnesium hydroxide (MILK OF MAGNESIA) 400 MG/5ML suspension Take 30 mLs by mouth at bedtime as needed for mild constipation.   . Multiple Vitamin (MULTIVITAMIN) tablet Take 1 tablet daily by mouth.  . neomycin-bacitracin-polymyxin (NEOSPORIN) OINT Apply 1 application  topically as needed for wound care.  . nitrofurantoin (MACRODANTIN) 100 MG capsule Take 100 mg daily by mouth.  . polyethylene glycol (MIRALAX / GLYCOLAX) 17 g packet Take 17 g by mouth daily as needed for mild constipation.  . traMADol (ULTRAM) 50 MG tablet Take 1 tablet (50 mg total) by mouth every 6 (six) hours as needed for moderate pain.   No facility-administered encounter medications on file as of 05/25/2019.     PHYSICAL EXAM/ROS:   Current and past weights:  98.3 lbs. Was a resident at [facility]. For plan to d/c back on 06/02/2019. General: NAD, frail appearing, thin Cardiovascular: no chest pain reported, no edema,  Pulmonary: no cough, no increased SOB,  Abdomen: appetite fair, 1-25%, house shakes, total assisted feeding, endorses constipation, incontinent of bowel GU: denies dysuria, incontinent of urine MSK:  Hip fracture repaired, at SNF for rehab.  Skin: no rashes or wounds reported Neurological: Weakness, dementia 7B, several words occasionally.  Eliezer LoftsKathryn McKelvey Quindarius Cabello, NP

## 2019-05-30 ENCOUNTER — Encounter: Payer: Self-pay | Admitting: Orthopedic Surgery

## 2019-05-30 NOTE — Anesthesia Preprocedure Evaluation (Signed)
Anesthesia Evaluation  Patient identified by MRN, date of birth, ID band Patient awake    Reviewed: Allergy & Precautions, H&P , NPO status , Patient's Chart, lab work & pertinent test results, reviewed documented beta blocker date and time   Airway Mallampati: II   Neck ROM: full    Dental  (+) Poor Dentition   Pulmonary neg pulmonary ROS,    Pulmonary exam normal        Cardiovascular Exercise Tolerance: Poor negative cardio ROS Normal cardiovascular exam Rhythm:regular Rate:Normal     Neuro/Psych PSYCHIATRIC DISORDERS Dementia negative neurological ROS     GI/Hepatic negative GI ROS, Neg liver ROS,   Endo/Other  negative endocrine ROS  Renal/GU negative Renal ROS  negative genitourinary   Musculoskeletal   Abdominal   Peds  Hematology negative hematology ROS (+)   Anesthesia Other Findings Past Medical History: No date: Alzheimer's dementia ([place])     Comment:  With Primary Progressive Aphasia No date: Aphasia No date: Dementia ([place]) No date: Dementia with behavioral disturbance ([facility]) No date: Osteoporosis Past Surgical History: No date: CATARACT EXTRACTION No date: COLONOSCOPY 05/09/2019: HIP ARTHROPLASTY; Left     Comment:  Procedure: ARTHROPLASTY BIPOLAR HIP (HEMIARTHROPLASTY);               Surgeon: Thornton Park, MD;  Location: [facility] ORS;                Service: Orthopedics;  Laterality: Left; BMI    Body Mass Index: 26.90 kg/m     Reproductive/Obstetrics negative OB ROS                             Anesthesia Physical Anesthesia Plan  ASA: III and emergent  Anesthesia Plan: Spinal   Post-op Pain Management:    Induction:   PONV Risk Score and Plan:   Airway Management Planned:   Additional Equipment:   Intra-op Plan:   Post-operative Plan:   Informed Consent: I have reviewed the patients History and Physical, chart, labs and discussed the procedure  including the risks, benefits and alternatives for the proposed anesthesia with the patient or authorized representative who has indicated his/her understanding and acceptance.     Dental Advisory Given  Plan Discussed with: CRNA  Anesthesia Plan Comments:         Anesthesia Quick Evaluation

## 2019-05-30 NOTE — Anesthesia Postprocedure Evaluation (Signed)
Anesthesia Post Note  Patient: Sydney Turner  Procedure(s) Performed: ARTHROPLASTY BIPOLAR HIP (HEMIARTHROPLASTY) (Left Hip)  Patient location during evaluation: PACU Anesthesia Type: MAC Level of consciousness: oriented and awake and alert Pain management: pain level controlled Vital Signs Assessment: post-procedure vital signs reviewed and stable Respiratory status: spontaneous breathing, respiratory function stable and patient connected to nasal cannula oxygen Cardiovascular status: blood pressure returned to baseline and stable Postop Assessment: no headache, no backache and no apparent nausea or vomiting Anesthetic complications: no     Last Vitals:  Vitals:   05/15/19 0748 05/15/19 1529  BP: (!) 129/59 (!) 114/56  Pulse: 92 90  Resp: 17 15  Temp: 36.6 C 37.3 C  SpO2: 99% 95%    Last Pain:  Vitals:   05/15/19 1529  TempSrc: Axillary  PainSc: 0-No pain                 Molli Barrows

## 2019-06-07 ENCOUNTER — Non-Acute Institutional Stay: Payer: Medicare Other | Admitting: Primary Care

## 2019-06-07 ENCOUNTER — Other Ambulatory Visit: Payer: Self-pay

## 2019-06-08 ENCOUNTER — Telehealth: Payer: Self-pay | Admitting: Primary Care

## 2019-06-08 NOTE — Telephone Encounter (Signed)
Attempted visit at [facility]. Was advised patient had returned home. She is a resident of [facility] memory care.

## 2019-06-16 ENCOUNTER — Non-Acute Institutional Stay: Payer: Medicare Other | Admitting: Primary Care

## 2019-06-16 ENCOUNTER — Other Ambulatory Visit: Payer: Self-pay

## 2019-06-16 DIAGNOSIS — Z515 Encounter for palliative care: Secondary | ICD-10-CM

## 2019-06-16 NOTE — Progress Notes (Signed)
[facility] Consult Note Telephone: [phone]  Fax: [phone]   PATIENT NAME: Sydney Turner DOB: Sep 15, 1941 MRN: [id number]  PRIMARY CARE PROVIDER:   [facility], [address] [place] [id number] [phone]  REFERRING PROVIDER:  [facility] [address] [place],  [place] [id number] [phone]  RESPONSIBLE PARTY:   Extended Emergency Contact Information Primary Emergency Contact: Eustaquio MaizeWilliams,[name]  [place] Mobile Phone: [phone] Relation: Daughter Secondary Emergency Contact: Cupples,sherry Mobile Phone: [phone] Relation: Daughter   ASSESSMENT AND RECOMMENDATIONS:   1. Advance Care Planning/Goals of Care: Goals include to maximize quality of life and symptom management.  Discussed goals of care with daughter Jeanice LimHolly by phone. We discussed her level of care is declining due to hip fracture and dementia. We discussed the grief of having no contact and ability to interact. Jeanice LimHolly said she has been on hospice a few times, but she was ultimately dischareged. We discussed the progression of disease and hospice eligibility requirements.   2. Symptom Management:  Nutrition: Staff states she eats sporadically, ate breakfast but did not touch lunch. She was sitting with head down at the table and would not respond to my encouragement to try her meal. Staff states they also attempted and she refused.  Mobility: Was ambulatory before hip fracture and had made some progress for more mobility but daughter states progress is waning. We discussed difficulty of rehab with dementia due to difficulty to follow instructions, initiate, etc.   Isolation: Seems withdrawn due to isolation. Daughter is concerned about patient's depression. I provided listening for her to share grief at their separation.   3. Family /Caregiver/Community Supports: POA has 2 sisters, one  lives locally,the other in [place]. Patient is resident of ALF. We discussed the levels of care and her concern that her mother might have to change facilities. I encouraged her to discuss with ALF management.  4. Cognitive / Functional decline: After hip break was able to recover some function, feed self some, but has not had much improvement in the assisted facility. Has declined since having to be in facilities and isolated due to covid. Has been on hospice services before but discharged for stability. We discussed the statistically typical decline after a fracture and I encouraged her to consider hospice services from any provider again if patient further declines.  5. Follow up Palliative Care Visit: Palliative care will continue to follow for goals of care clarification and symptom management. Return 2-3 weeks or prn.  I spent 35 minutes providing this consultation,  from 1200 to 1235. More than 50% of the time in this consultation was spent coordinating communication.   HISTORY OF PRESENT ILLNESS:  Sydney GumMargaret K Turner is a 78 y.o. year old female with multiple medical problems including Alzheimer's dementia, progressive aphasia, osteoporosis. Palliative Care was asked to follow this patient by consultation request of [facility] to help address advance care planning and goals of care. This is a follow up visit.  CODE STATUS: DNR, [facility]  PPS: 30% (weak) HOSPICE ELIGIBILITY/DIAGNOSIS: yes, protein calorie malnutrition, dementia  PAST MEDICAL HISTORY:  Past Medical History:  Diagnosis Date  . 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 Aphasia   . Dementia (HCC)   . Dementia with behavioral disturbance (HCC)   . Osteoporosis     SOCIAL HX:  Social History   Tobacco Use  .  Smoking status: Never Smoker  . Smokeless tobacco: Never Used  Substance Use Topics  . Alcohol use: No    Frequency: Never    ALLERGIES:  Allergies   Allergen Reactions  . Sulfa Antibiotics Swelling    Edema  . Amoxicillin     Has patient had a PCN reaction causing immediate rash, facial/tongue/throat swelling, SOB or lightheadedness with hypotension: Unknown Has patient had a PCN reaction causing severe rash involving mucus membranes or skin necrosis: Unknown Has patient had a PCN reaction that required hospitalization: Unknown Has patient had a PCN reaction occurring within the last 10 years: Unknown If all of the above answers are "NO", then may proceed with Cephalosporin use.   Marland Kitchen Penicillins Nausea And Vomiting    Has patient had a PCN reaction causing immediate rash, facial/tongue/throat swelling, SOB or lightheadedness with hypotension: Unknown Has patient had a PCN reaction causing severe rash involving mucus membranes or skin necrosis: Unknown Has patient had a PCN reaction that required hospitalization: Unknown Has patient had a PCN reaction occurring within the last 10 years: Unknown If all of the above answers are "NO", then may proceed with Cephalosporin use.      PERTINENT MEDICATIONS:  Outpatient Encounter Medications as of 06/16/2019  Medication Sig  . acetaminophen (TYLENOL) 500 MG tablet Take 500 mg by mouth every 4 (four) hours as needed for mild pain or fever.  Marland Kitchen alum & mag hydroxide-simeth (MINTOX) 200-200-20 MG/5ML suspension Take 30 mLs by mouth daily as needed for indigestion or heartburn (max 4 doses per 24 hours).  . Calcium Carb-Cholecalciferol (CALCIUM 600+D) 600-800 MG-UNIT TABS Take 1 tablet daily by mouth.  . Cholecalciferol (D3-1000 PO) Take 1,000 Units daily by mouth.  . ciclopirox (PENLAC) 8 % solution Apply at bedtime topically. Apply over nail and surrounding skin. Apply daily over previous coat. After seven (7) days, may remove with alcohol and continue cycle.  . docusate sodium (COLACE) 100 MG capsule Take 100 mg daily by mouth.  . enoxaparin (LOVENOX) 40 MG/0.4ML injection Inject 0.4 mLs (40 mg  total) into the skin daily for 21 days.  Marland Kitchen levothyroxine (SYNTHROID, LEVOTHROID) 112 MCG tablet Take 112 mcg by mouth daily before breakfast.  . loperamide (IMODIUM) 2 MG capsule Take 2 mg by mouth as needed for diarrhea or loose stools (max 8 doses in 24 hours).   . magnesium hydroxide (MILK OF MAGNESIA) 400 MG/5ML suspension Take 30 mLs by mouth at bedtime as needed for mild constipation.   . Multiple Vitamin (MULTIVITAMIN) tablet Take 1 tablet daily by mouth.  . neomycin-bacitracin-polymyxin (NEOSPORIN) OINT Apply 1 application topically as needed for wound care.  . nitrofurantoin (MACRODANTIN) 100 MG capsule Take 100 mg daily by mouth.  . polyethylene glycol (MIRALAX / GLYCOLAX) 17 g packet Take 17 g by mouth daily as needed for mild constipation.  . traMADol (ULTRAM) 50 MG tablet Take 1 tablet (50 mg total) by mouth every 6 (six) hours as needed for moderate pain.   No facility-administered encounter medications on file as of 06/16/2019.     PHYSICAL EXAM / ROS:   Current and past weights: most recent is from June 108 lb, at [facility] was 98 lbs. Will continue to monitor. General: NAD, frail appearing, thin, has scheduled acetaminophen for pain Cardiovascular: no chest pain reported, no edema, S1S2 Pulmonary: no cough, no increased SOB, Clear all fields Abdomen: appetite fair, eats 100% of some meals, none of others, denies,constipation, incontinent of bowel GU: denies dysuria, incontinent of urine  MSK:  no joint deformities, non ambulatory, has been having PT from fx, was able to ambulate prior  Skin: no rashes or wounds reported, skin fragile but intact Neurological: Weakness, dementia Fast stage 7c, awake, non interactive, Did not eat lunch.  Jason Coop, NP  COVID-19 PATIENT SCREENING TOOL  Person answering questions: ________Staff___________ _____   1.  Is the patient or any family member in the home showing any signs or symptoms regarding respiratory infection?                Person with Symptom- ______na_____________________  a. Fever                                                                          Yes___ No___          ___________________  b. Shortness of breath                                                    Yes___ No___          ___________________ c. Cough/congestion                                       Yes___  No___         ___________________ d. Body aches/pains                                                         Yes___ No___        ____________________ e. Gastrointestinal symptoms (diarrhea, nausea)           Yes___ No___        ____________________  2. Within the past 14 days, has anyone living in the home had any contact with someone with or under investigation for COVID-19?    Yes___ No_x_   Person __________________

## 2019-06-19 ENCOUNTER — Encounter: Payer: Self-pay | Admitting: Primary Care

## 2019-06-19 NOTE — Telephone Encounter (Signed)
Opened in error

## 2019-07-12 ENCOUNTER — Non-Acute Institutional Stay: Payer: Medicare Other | Admitting: Adult Health Nurse Practitioner

## 2019-07-12 ENCOUNTER — Other Ambulatory Visit: Payer: Self-pay

## 2019-07-12 DIAGNOSIS — Z515 Encounter for palliative care: Secondary | ICD-10-CM

## 2019-07-12 NOTE — Progress Notes (Signed)
[facility] Consult Note Telephone: [phone]  Fax: [phone]  PATIENT NAME: Sydney Turner DOB: 04-Sep-1941 MRN: [id number]  PRIMARY CARE PROVIDER:   [facility]  REFERRING PROVIDER:  [facility] [address] [place],  [place] [address]  RESPONSIBLE PARTY:  Extended Emergency Contact Information Primary Emergency Contact: Sydney Turner [place] Mobile Phone: [phone] Relation: Daughter Secondary Emergency Contact: Sydney Turner,Sydney Turner Mobile Phone: [phone] Relation: Daughter       RECOMMENDATIONS and PLAN:  1.  Advanced care planning.  Patient is a DNR.  No changes made today.   Spoke with daughter, Sydney Turner.  She has concerns about her mother but is unable to voice them without being able to see her closer.  States that the outside visits are limited and at a far distance that her mom always looks like she is not engaged in the visit and that she is mentally "far away." States that she does not make eye contact.  She did make eye contact with me today. Have reached out our SW for any help in getting a closer visit with her mom to help with her concerns about not being able to be with her mom more.  2.  Dementia.  Fast 7b.  Patient cannot get up unassisted but staff reports that she is doing okay with PT/OT.  She does try to get unassisted so the staff have been keeping her close so they can keep an eye on her so she does not fall.  No falls reported. Patient was not verbal today but did follow some commands during exam but not all.  She looked at me when I talked to her.  No reported weight loss, current weight is 103.  Continue supportive care  I spent 25 minutes providing this consultation,  from 10:45 to 11:10. More than 50% of the time in this consultation was spent coordinating communication.   HISTORY OF PRESENT ILLNESS:  Sydney Turner is a 78 y.o. year old  female with multiple medical problems including Alzheimer's dementia, progressive aphasia, osteoporosis . Palliative Care was asked to help address goals of care.   CODE STATUS: DNR  PPS: 30% HOSPICE ELIGIBILITY/DIAGNOSIS: TBD  PHYSICAL EXAM:   General: NAD, frail appearing, thin Cardiovascular: regular rate and rhythm Pulmonary: lung sounds clear; normal respiratory effort Abdomen: soft, nontender, + bowel sounds GU: no suprapubic tenderness Extremities: no edema, no joint deformities Neurological: Weakness; A&O to self, did not talk to provider during visit    PAST MEDICAL HISTORY:  Past Medical History:  Diagnosis Date  . Alzheimer's dementia ([place])    With Primary Progressive Aphasia  . Aphasia   . Dementia ([place])   . Dementia with behavioral disturbance ([place])   . Osteoporosis     SOCIAL HX:  Social History   Tobacco Use  . Smoking status: Never Smoker  . Smokeless tobacco: Never Used  Substance Use Topics  . Alcohol use: No    Frequency: Never    ALLERGIES:  Allergies  Allergen Reactions  . Sulfa Antibiotics Swelling    Edema  . Amoxicillin     Has patient had a PCN reaction causing immediate rash, facial/tongue/throat swelling, SOB or lightheadedness with hypotension: Unknown Has patient had a PCN reaction causing severe rash involving mucus membranes or skin necrosis: Unknown Has patient had a PCN reaction that required hospitalization: Unknown Has patient had a PCN reaction occurring within the last 10 years: Unknown  If all of the above answers are "NO", then may proceed with Cephalosporin use.   Marland Kitchen Penicillins Nausea And Vomiting    Has patient had a PCN reaction causing immediate rash, facial/tongue/throat swelling, SOB or lightheadedness with hypotension: Unknown Has patient had a PCN reaction causing severe rash involving mucus membranes or skin necrosis: Unknown Has patient had a PCN reaction that required hospitalization: Unknown Has patient had a  PCN reaction occurring within the last 10 years: Unknown If all of the above answers are "NO", then may proceed with Cephalosporin use.      PERTINENT MEDICATIONS:  Outpatient Encounter Medications as of 07/12/2019  Medication Sig  . acetaminophen (TYLENOL) 500 MG tablet Take 500 mg by mouth every 4 (four) hours as needed for mild pain or fever.  Marland Kitchen alum & mag hydroxide-simeth (MINTOX) 200-200-20 MG/5ML suspension Take 30 mLs by mouth daily as needed for indigestion or heartburn (max 4 doses per 24 hours).  . Calcium Carb-Cholecalciferol (CALCIUM 600+D) 600-800 MG-UNIT TABS Take 1 tablet daily by mouth.  . Cholecalciferol (D3-1000 PO) Take 1,000 Units daily by mouth.  . ciclopirox (PENLAC) 8 % solution Apply at bedtime topically. Apply over nail and surrounding skin. Apply daily over previous coat. After seven (7) days, may remove with alcohol and continue cycle.  . docusate sodium (COLACE) 100 MG capsule Take 100 mg daily by mouth.  . enoxaparin (LOVENOX) 40 MG/0.4ML injection Inject 0.4 mLs (40 mg total) into the skin daily for 21 days.  Marland Kitchen levothyroxine (SYNTHROID, LEVOTHROID) 112 MCG tablet Take 112 mcg by mouth daily before breakfast.  . loperamide (IMODIUM) 2 MG capsule Take 2 mg by mouth as needed for diarrhea or loose stools (max 8 doses in 24 hours).   . magnesium hydroxide (MILK OF MAGNESIA) 400 MG/5ML suspension Take 30 mLs by mouth at bedtime as needed for mild constipation.   . Multiple Vitamin (MULTIVITAMIN) tablet Take 1 tablet daily by mouth.  . neomycin-bacitracin-polymyxin (NEOSPORIN) OINT Apply 1 application topically as needed for wound care.  . nitrofurantoin (MACRODANTIN) 100 MG capsule Take 100 mg daily by mouth.  . polyethylene glycol (MIRALAX / GLYCOLAX) 17 g packet Take 17 g by mouth daily as needed for mild constipation.  . traMADol (ULTRAM) 50 MG tablet Take 1 tablet (50 mg total) by mouth every 6 (six) hours as needed for moderate pain.   No facility-administered  encounter medications on file as of 07/12/2019.     Monna Crean Marlena Clipper, NP

## 2019-07-19 ENCOUNTER — Telehealth: Payer: Self-pay

## 2019-07-21 NOTE — Telephone Encounter (Signed)
SW received referral from NP. SW attempted to call [name] (patient's daughter). SW left VM and contact information.

## 2019-09-26 ENCOUNTER — Telehealth: Payer: Self-pay | Admitting: Adult Health Nurse Practitioner

## 2019-09-26 NOTE — Telephone Encounter (Signed)
Spoke with daughter about concerns with patient having recent fall.  Had been trying the facility with no one picking up and unable to leave message.  Daughter gave me updated phone number.  She states that her PCP had come by to see her yesterday and that the patient was fine and in no pain.  Unfortunately patient was found on floor sitting on her bottom on the other side of her room.  Staff had no reported any injury or pain.  Will go this Friday to check on patient and update daughter after visit. Trayce Caravello K. Olena Heckle NP

## 2019-09-29 ENCOUNTER — Non-Acute Institutional Stay: Payer: Medicare Other | Admitting: Adult Health Nurse Practitioner

## 2019-09-29 ENCOUNTER — Other Ambulatory Visit: Payer: Self-pay

## 2019-09-29 DIAGNOSIS — G309 Alzheimer's disease, unspecified: Secondary | ICD-10-CM

## 2019-09-29 DIAGNOSIS — Z515 Encounter for palliative care: Secondary | ICD-10-CM

## 2019-09-29 DIAGNOSIS — F0280 Dementia in other diseases classified elsewhere without behavioral disturbance: Secondary | ICD-10-CM

## 2019-09-30 NOTE — Progress Notes (Signed)
Therapist, nutritional Palliative Care Consult Note Telephone: [phone]  Fax: [phone]  PATIENT NAME: Sydney Turner DOB: 1941/08/15 MRN: [id number]  PRIMARY CARE PROVIDER:   Almetta Lovely[facility]  REFERRING PROVIDER:  [facility] [address] [place],  [place] [address]  RESPONSIBLE PARTY:  Extended Emergency Contact Information Primary Emergency Contact: Eustaquio Maize [place] Mobile Phone: [phone] Relation: Daughter Secondary Emergency Contact: Notch,sherry Mobile Phone: [phone] Relation: Daughter       RECOMMENDATIONS and PLAN:  1.  Advanced care planning.  Patient is a DNR.  Will call daughter to update on visit.  2.  Dementia.  Fast 7b.  Patient cannot get up unassisted but staff reports that she is doing okay with PT/OT.  She does try to get unassisted so the staff have been keeping her close so they can keep an eye on her so she does not fall. Has had 2 falls without injury since last visit. Patient was not verbal today but did follow some commands during exam but not all.  She looked at me when I talked to her. Appetite is good. No reported weight loss.  Continue supportive care  Patient does not appear to be in any pain.  No concerns reported by staff today.  Denies SOB, cough, fever, N/V/D, constipation.  No infections or hospital visits since last visit. Palliative care will continue to monitor for symptom management and decline and make recommendations as needed.  I spent 30 minutes providing this consultation,  from 11:15 to 11:45. More than 50% of the time in this consultation was spent coordinating communication.   HISTORY OF PRESENT ILLNESS:  Sydney Turner is a 78 y.o. year old female with multiple medical problems including Alzheimer's dementia, progressive aphasia, osteoporosis. Palliative Care was asked to help address goals of care.   CODE STATUS: DNR  PPS: 30% HOSPICE  ELIGIBILITY/DIAGNOSIS: TBD  PHYSICAL EXAM:   General: NAD, frail appearing Cardiovascular: regular rate and rhythm Pulmonary: lung sounds clear; normal respiratory rate Abdomen: soft, nontender, + bowel sounds GU: no suprapubic tenderness Extremities: no edema, no joint deformities Skin: no rashes Neurological: Weakness; nonverbal  PAST MEDICAL HISTORY:  Past Medical History:  Diagnosis Date  . 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 Aphasia   . Dementia (HCC)   . Dementia with behavioral disturbance (HCC)   . Osteoporosis     SOCIAL HX:  Social History   Tobacco Use  . Smoking status: Never Smoker  . Smokeless tobacco: Never Used  Substance Use Topics  . Alcohol use: No    ALLERGIES:  Allergies  Allergen Reactions  . Sulfa Antibiotics Swelling    Edema  . Amoxicillin     Has patient had a PCN reaction causing immediate rash, facial/tongue/throat swelling, SOB or lightheadedness with hypotension: Unknown Has patient had a PCN reaction causing severe rash involving mucus membranes or skin necrosis: Unknown Has patient had a PCN reaction that required hospitalization: Unknown Has patient had a PCN reaction occurring within the last 10 years: Unknown If all of the above answers are "NO", then may proceed with Cephalosporin use.   Marland Kitchen Penicillins Nausea And Vomiting    Has patient had a PCN reaction causing immediate rash, facial/tongue/throat swelling, SOB or lightheadedness with hypotension: Unknown Has patient had a PCN reaction causing severe rash involving mucus membranes or skin necrosis: Unknown Has patient had a PCN reaction that required hospitalization: Unknown Has patient  had a PCN reaction occurring within the last 10 years: Unknown If all of the above answers are "NO", then may proceed with Cephalosporin use.      PERTINENT MEDICATIONS:  Outpatient Encounter Medications as of 09/29/2019  Medication Sig  . acetaminophen (TYLENOL)  500 MG tablet Take 500 mg by mouth every 4 (four) hours as needed for mild pain or fever.  Marland Kitchen alum & mag hydroxide-simeth (MINTOX) 416-606-30 MG/5ML suspension Take 30 mLs by mouth daily as needed for indigestion or heartburn (max 4 doses per 24 hours).  . Calcium Carb-Cholecalciferol (CALCIUM 600+D) 600-800 MG-UNIT TABS Take 1 tablet daily by mouth.  . Cholecalciferol (D3-1000 PO) Take 1,000 Units daily by mouth.  . ciclopirox (PENLAC) 8 % solution Apply at bedtime topically. Apply over nail and surrounding skin. Apply daily over previous coat. After seven (7) days, may remove with alcohol and continue cycle.  . docusate sodium (COLACE) 100 MG capsule Take 100 mg daily by mouth.  . levothyroxine (SYNTHROID, LEVOTHROID) 112 MCG tablet Take 112 mcg by mouth daily before breakfast.  . loperamide (IMODIUM) 2 MG capsule Take 2 mg by mouth as needed for diarrhea or loose stools (max 8 doses in 24 hours).   . magnesium hydroxide (MILK OF MAGNESIA) 400 MG/5ML suspension Take 30 mLs by mouth at bedtime as needed for mild constipation.   . Multiple Vitamin (MULTIVITAMIN) tablet Take 1 tablet daily by mouth.  . neomycin-bacitracin-polymyxin (NEOSPORIN) OINT Apply 1 application topically as needed for wound care.  . nitrofurantoin (MACRODANTIN) 100 MG capsule Take 100 mg daily by mouth.  . polyethylene glycol (MIRALAX / GLYCOLAX) 17 g packet Take 17 g by mouth daily as needed for mild constipation.  . traMADol (ULTRAM) 50 MG tablet Take 1 tablet (50 mg total) by mouth every 6 (six) hours as needed for moderate pain.   No facility-administered encounter medications on file as of 09/29/2019.      Elnor Renovato Jenetta Downer, NP

## 2019-10-02 ENCOUNTER — Telehealth: Payer: Self-pay | Admitting: Adult Health Nurse Practitioner

## 2019-10-02 NOTE — Telephone Encounter (Signed)
Spoke with daughter to give update on visit from last week.  No new concerns at this time. Gurley Climer K. Olena Heckle NP

## 2019-11-30 ENCOUNTER — Emergency Department: Payer: Medicare PPO

## 2019-11-30 ENCOUNTER — Emergency Department
Admission: EM | Admit: 2019-11-30 | Discharge: 2019-11-30 | Disposition: A | Discharge status: Skilled Nursing Facility | Payer: Medicare PPO | Attending: Emergency Medicine | Admitting: Emergency Medicine

## 2019-11-30 ENCOUNTER — Other Ambulatory Visit: Payer: Self-pay

## 2019-11-30 DIAGNOSIS — G309 Alzheimer's disease, unspecified: Secondary | ICD-10-CM | POA: Diagnosis not present

## 2019-11-30 DIAGNOSIS — N390 Urinary tract infection, site not specified: Secondary | ICD-10-CM | POA: Diagnosis not present

## 2019-11-30 DIAGNOSIS — W19XXXA Unspecified fall, initial encounter: Secondary | ICD-10-CM | POA: Insufficient documentation

## 2019-11-30 DIAGNOSIS — Y999 Unspecified external cause status: Secondary | ICD-10-CM | POA: Insufficient documentation

## 2019-11-30 DIAGNOSIS — Z043 Encounter for examination and observation following other accident: Secondary | ICD-10-CM | POA: Diagnosis present

## 2019-11-30 DIAGNOSIS — Y9389 Activity, other specified: Secondary | ICD-10-CM | POA: Insufficient documentation

## 2019-11-30 DIAGNOSIS — Y92129 Unspecified place in nursing home as the place of occurrence of the external cause: Secondary | ICD-10-CM | POA: Insufficient documentation

## 2019-11-30 LAB — CBC WITH DIFFERENTIAL/PLATELET
Abs Immature Granulocytes: 0.01 10*3/uL (ref 0.00–0.07)
Basophils Absolute: 0 10*3/uL (ref 0.0–0.1)
Basophils Relative: 1 %
Eosinophils Absolute: 0.1 10*3/uL (ref 0.0–0.5)
Eosinophils Relative: 1 %
HCT: 39.3 % (ref 36.0–46.0)
Hemoglobin: 12.6 g/dL (ref 12.0–15.0)
Immature Granulocytes: 0 %
Lymphocytes Relative: 13 %
Lymphs Abs: 0.6 10*3/uL — ABNORMAL LOW (ref 0.7–4.0)
MCH: 32.1 pg (ref 26.0–34.0)
MCHC: 32.1 g/dL (ref 30.0–36.0)
MCV: 100.3 fL — ABNORMAL HIGH (ref 80.0–100.0)
Monocytes Absolute: 0.5 10*3/uL (ref 0.1–1.0)
Monocytes Relative: 10 %
Neutro Abs: 3.5 10*3/uL (ref 1.7–7.7)
Neutrophils Relative %: 75 %
Platelets: 198 10*3/uL (ref 150–400)
RBC: 3.92 MIL/uL (ref 3.87–5.11)
RDW: 13.4 % (ref 11.5–15.5)
WBC: 4.7 10*3/uL (ref 4.0–10.5)
nRBC: 0 % (ref 0.0–0.2)

## 2019-11-30 LAB — URINALYSIS, COMPLETE (UACMP) WITH MICROSCOPIC
Bilirubin Urine: NEGATIVE
Glucose, UA: NEGATIVE mg/dL
Ketones, ur: NEGATIVE mg/dL
Nitrite: NEGATIVE
Protein, ur: 30 mg/dL — AB
RBC / HPF: >50 RBC/hpf — ABNORMAL HIGH (ref 0–5)
Specific Gravity, Urine: 1.006 (ref 1.005–1.030)
Squamous Epithelial / HPF: NONE SEEN (ref 0–5)
WBC, UA: >50 WBC/hpf — ABNORMAL HIGH (ref 0–5)
pH: 7 (ref 5.0–8.0)

## 2019-11-30 LAB — TROPONIN I (HIGH SENSITIVITY)
Troponin I (High Sensitivity): 14 ng/L (ref <18)
Troponin I (High Sensitivity): 14 ng/L (ref <18)

## 2019-11-30 LAB — BASIC METABOLIC PANEL
Anion gap: 7 (ref 5–15)
BUN: 24 mg/dL — ABNORMAL HIGH (ref 8–23)
CO2: 29 mmol/L (ref 22–32)
Calcium: 10 mg/dL (ref 8.9–10.3)
Chloride: 104 mmol/L (ref 98–111)
Creatinine, Ser: 0.58 mg/dL (ref 0.44–1.00)
GFR calc Af Amer: >60 mL/min (ref >60)
GFR calc non Af Amer: >60 mL/min (ref >60)
Glucose, Bld: 118 mg/dL — ABNORMAL HIGH (ref 70–99)
Potassium: 4.2 mmol/L (ref 3.5–5.1)
Sodium: 140 mmol/L (ref 135–145)

## 2019-11-30 LAB — GLUCOSE, CAPILLARY: Glucose-Capillary: 73 mg/dL (ref 70–99)

## 2019-11-30 MED ORDER — SODIUM CHLORIDE 0.9 % IV SOLN
1.0000 g | Freq: Once | INTRAVENOUS | Status: AC
  Administered 2019-11-30: 1 g via INTRAVENOUS
  Filled 2019-11-30: qty 10

## 2019-11-30 MED ORDER — CEPHALEXIN 500 MG PO CAPS: 500.0000 mg | ORAL_CAPSULE | Freq: Four times a day (QID) | ORAL | 0 refills | Status: AC

## 2019-11-30 NOTE — ED Notes (Signed)
Pt may eat and drink per EDP. Pt  Given juice per daughter's request, pt's daughter at bedside

## 2019-11-30 NOTE — ED Notes (Signed)
Bed alarm on, pt attempted to climb OOB, pt redirected multiple times, pt continued to climb OOB, sitter requested and sent. Daughter at bedside at present, states pt will not try to climb OOB while she is present. Pt to CT.

## 2019-11-30 NOTE — Discharge Instructions (Addendum)
Please talk to Sydney Turner's doctors about performing further imaging to better evaluate the pulmonary nodule seen on ct scan today. Please have Sydney Turner seek medical attention for any high fevers, chest pain, shortness of breath, change in behavior, persistent vomiting, bloody stool or any other new or concerning symptoms.

## 2019-11-30 NOTE — ED Provider Notes (Signed)
[facility] Emergency Department Provider Note  ____________________________________________   I have reviewed the triage vital signs and the nursing notes.   HISTORY  Chief Complaint Fall   History limited by and level 5 caveat due to: Dementia   HPI Sydney Turner is a 79 y.o. female who presents to the emergency department today because of concern for unwitnessed fall. Patient is unable to give any history. Per report time on the ground was less than ten minutes.    Records reviewed. Per medical record review patient has a history of dementia.   Past Medical History:  Diagnosis Date  . 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 Aphasia   . Dementia ([place])   . Dementia with behavioral disturbance ([place])   . Osteoporosis     Patient Active Problem List   Diagnosis Date Noted  . Closed left hip fracture ([place]) 05/08/2019  . Sepsis secondary to UTI ([place]) 02/03/2017    Past Surgical History:  Procedure Laterality Date  . CATARACT EXTRACTION    . COLONOSCOPY    . HIP ARTHROPLASTY Left 05/09/2019   Procedure: ARTHROPLASTY BIPOLAR HIP (HEMIARTHROPLASTY);  Surgeon: Thornton Park, MD;  Location: [facility] ORS;  Service: Orthopedics;  Laterality: Left;    Prior to Admission medications   Medication Sig Start Date End Date Taking? Authorizing Provider  acetaminophen (TYLENOL) 325 MG tablet Take 650 mg by mouth 3 (three) times daily.   Yes [provider]  acetaminophen (TYLENOL) 500 MG tablet Take 500 mg by mouth every 4 (four) hours as needed for mild pain or fever.   Yes [provider]  alum & mag hydroxide-simeth (MINTOX) 200-200-20 MG/5ML suspension Take 30 mLs by mouth daily as needed for indigestion or heartburn (max 4 doses per 24 hours).   Yes [provider]  carboxymethylcellulose (REFRESH PLUS) 0.5 % SOLN Place 1 drop into both eyes 2 (two) times daily.   Yes [provider]   Cholecalciferol (D3-1000) 25 MCG (1000 UT) tablet Take 1,000 Units by mouth daily.    Yes [provider]  guaifenesin (ROBITUSSIN) 100 MG/5ML syrup Take 200 mg by mouth every 6 (six) hours as needed for cough.   Yes [provider]  levothyroxine (SYNTHROID) 125 MCG tablet Take 125 mcg by mouth daily before breakfast.    Yes [provider]  loperamide (IMODIUM) 2 MG capsule Take 2 mg by mouth 4 (four) times daily as needed for diarrhea or loose stools.    Yes [provider]  magnesium hydroxide (MILK OF MAGNESIA) 400 MG/5ML suspension Take 30 mLs by mouth at bedtime as needed for mild constipation.    Yes [provider]  neomycin-bacitracin-polymyxin (NEOSPORIN) OINT Apply 1 application topically as needed for wound care.   Yes [provider]  polyethylene glycol (MIRALAX / GLYCOLAX) 17 g packet Take 17 g by mouth daily as needed for mild constipation. 05/15/19  Yes Gladstone Lighter, MD    Allergies Sulfa antibiotics, Amoxicillin, and Penicillins  No family history on file.  Social History Social History   Tobacco Use  . Smoking status: Never Smoker  . Smokeless tobacco: Never Used  Substance Use Topics  . Alcohol use: No  . Drug use: No    Review of Systems Unable to obtain secondary to dementia.  ____________________________________________   PHYSICAL EXAM:  VITAL SIGNS: ED Triage Vitals  Enc Vitals Group     BP 11/30/19 1550 (!) 161/82  Pulse Rate 11/30/19 1550 77     Resp 11/30/19 1550 14     Temp --      Temp src --      SpO2 11/30/19 1550 100 %     Weight 11/30/19 1552 120 lb (54.4 kg)   Constitutional: Awake and alert.  Eyes: Conjunctivae are normal.  ENT      Head: Normocephalic and atraumatic.      Nose: No congestion/rhinnorhea.      Mouth/Throat: Mucous membranes are moist.      Neck: No stridor. No midline tenderness.  Hematological/Lymphatic/Immunilogical: No cervical  lymphadenopathy. Cardiovascular: Normal rate, regular rhythm.  No murmurs, rubs, or gallops.  Respiratory: Normal respiratory effort without tachypnea nor retractions. Breath sounds are clear and equal bilaterally. No wheezes/rales/rhonchi. Gastrointestinal: Soft and non tender. No rebound. No guarding.  Genitourinary: Deferred Musculoskeletal: Normal range of motion in all extremities. No lower extremity edema. Neurologic:  Dementia. Awake and alert. Moving all extremities.  Skin:  Skin is warm, dry and intact. No rash noted. ____________________________________________    LABS (pertinent positives/negatives)  Trop hs 14 BMP wnl except glu 118, bun 24 CBC wbc 4.7, hgb 12.6, plt 198 UA hazy, moderate leukocytes, >50 rbc and wbc, many bacteria  ____________________________________________   EKG  I, Phineas Semen, attending physician, personally viewed and interpreted this EKG  EKG Time: 1551 Rate: 76 Rhythm: sinus rhythm Axis: left axis deviation Intervals: qtc 485 QRS: IVCD ST changes: no st elevation Impression: abnormal ekg  ____________________________________________    RADIOLOGY  CT head/cervical spine No acute abnormality. Right pulmonary nodule.  Right hip No acute abnormality  ____________________________________________   PROCEDURES  Procedures  ____________________________________________   INITIAL IMPRESSION / ASSESSMENT AND PLAN / ED COURSE  Pertinent labs & imaging results that were available during my care of the patient were reviewed by me and considered in my medical decision making (see chart for details).   Patient presents to the emergency department today after a fall. The patient without acute traumatic findings on exam or imaging. Did have findings concerning for UTI. Will give dose of antibiotics in the emergency department and plan on discharging with further antibiotics.   ____________________________________________   FINAL  CLINICAL IMPRESSION(S) / ED DIAGNOSES  Final diagnoses:  Lower urinary tract infectious disease  Fall, initial encounter     Note: This dictation was prepared with Office manager. Any transcriptional errors that result from this process are unintentional     Phineas Semen, MD 11/30/19 [phone]

## 2019-11-30 NOTE — ED Triage Notes (Signed)
Pt from [facility] after unwitnessed fall, pt on floor less than 10 minutes, pt did not appear to hit head or have LOC per EMS. Pt has dementia. Pt with c/o right hip pain. Pt moving both legs at present.

## 2019-11-30 NOTE — ED Notes (Signed)
Bed alarm placed.  

## 2019-11-30 NOTE — ED Notes (Signed)
EMS arrived to transport pt to [facility], pt doing well.

## 2019-11-30 NOTE — ED Notes (Addendum)
Pt brief changed and pt repositioned in bed- apple juice and graham crackers given

## 2019-12-01 LAB — URINE CULTURE: Culture: >=100000 — AB

## 2020-01-17 ENCOUNTER — Non-Acute Institutional Stay: Payer: Medicare PPO | Admitting: Adult Health Nurse Practitioner

## 2020-01-17 ENCOUNTER — Other Ambulatory Visit: Payer: Self-pay

## 2020-01-17 DIAGNOSIS — F0280 Dementia in other diseases classified elsewhere without behavioral disturbance: Secondary | ICD-10-CM

## 2020-01-17 DIAGNOSIS — Z515 Encounter for palliative care: Secondary | ICD-10-CM

## 2020-01-17 NOTE — Progress Notes (Signed)
[facility] Consult Note Telephone: [phone]  Fax: [phone]  PATIENT NAME: Sydney Turner DOB: 1941-03-12 MRN: [id number]  PRIMARY CARE PROVIDER:   Orvis Brill, [facility]  REFERRING PROVIDER:  [facility] [address],  [place] [address]  RESPONSIBLE PARTY:   Extended Emergency Contact Information Primary Emergency Contact: Laretta Alstrom [place] of [place] Mobile Phone: [phone] Relation: Daughter Secondary Emergency Contact: Byer,sherry Mobile Phone: [phone] Relation: Daughter    RECOMMENDATIONS and PLAN:  1.  Advanced care planning.  Patient is a DNR.   2.  Dementia. Fast 7b. Patient cannot get up unassisted but staff reports she does try to get unassisted so the staff have been keeping her close so they can keep an eye on her so she does not fall. Had fall on 11/30/19 and was evaluated at ER and found to have UTI.  Last fall was on 12/22/19 without injury. She is nonverbal today but she watches me as I speak to her.  Does not follow all commands. Appetite is good.No reported weight loss.  Weight today is 104.5 and last month was 104.  Daughter wants her to have morning naps and one in the afternoon if needed.  She states that she calls the facility most days and that this is not being done.  I am unsure if this is something the facility will accommodate and have reached out to PCP, Thea Gist, to see if this is something that she can put an order for depending on if she thinks if this is something the facility will be able to accommodate Continue supportive care  Patient does not appear to be in any pain.  No concerns reported by staff today.  Denies SOB, cough, fever, N/V/D, constipation. Palliative care will continue to monitor for symptom management and decline and make recommendations as needed.  Will follow up in 4-6 weeks.  I spent 50 minutes providing this  consultation,  from 11:50 to 12:40 including time with patient/family, chart review, provider coordination, and documentation. More than 50% of the time in this consultation was spent coordinating communication.   HISTORY OF PRESENT ILLNESS:  Sydney Turner is a 79 y.o. year old female with multiple medical problems including Alzheimer's dementia, progressive aphasia, osteoporosis. Palliative Care was asked to help address goals of care.   CODE STATUS: DNR  PPS: 30% HOSPICE ELIGIBILITY/DIAGNOSIS: TBD  PHYSICAL EXAM:   General: NAD, frail appearing Cardiovascular: regular rate and rhythm Pulmonary: lung sounds clear; normal respiratory rate Abdomen: soft, nontender, + bowel sounds GU: no suprapubic tenderness Extremities: no edema, no joint deformities Skin: no rashes Neurological: Weakness; nonverbal  PAST MEDICAL HISTORY:  Past Medical History:  Diagnosis Date  . 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 Aphasia   . Dementia ([place])   . Dementia with behavioral disturbance ([place])   . Osteoporosis     SOCIAL HX:  Social History   Tobacco Use  . Smoking status: Never Smoker  . Smokeless tobacco: Never Used  Substance Use Topics  . Alcohol use: No    ALLERGIES:  Allergies  Allergen Reactions  . Sulfa Antibiotics Swelling    Edema  . Amoxicillin     Has patient had a PCN reaction causing immediate rash, facial/tongue/throat swelling, SOB or lightheadedness with hypotension: Unknown Has patient had a PCN reaction causing severe rash involving mucus membranes or skin necrosis: Unknown Has patient had a PCN reaction that  required hospitalization: Unknown Has patient had a PCN reaction occurring within the last 10 years: Unknown If all of the above answers are "NO", then may proceed with Cephalosporin use.   Marland Kitchen Penicillins Nausea And Vomiting    Has patient had a PCN reaction causing immediate rash, facial/tongue/throat swelling, SOB or  lightheadedness with hypotension: Unknown Has patient had a PCN reaction causing severe rash involving mucus membranes or skin necrosis: Unknown Has patient had a PCN reaction that required hospitalization: Unknown Has patient had a PCN reaction occurring within the last 10 years: Unknown If all of the above answers are "NO", then may proceed with Cephalosporin use.      PERTINENT MEDICATIONS:  Outpatient Encounter Medications as of 01/17/2020  Medication Sig  . acetaminophen (TYLENOL) 325 MG tablet Take 650 mg by mouth 3 (three) times daily.  Marland Kitchen acetaminophen (TYLENOL) 500 MG tablet Take 500 mg by mouth every 4 (four) hours as needed for mild pain or fever.  Marland Kitchen alum & mag hydroxide-simeth (MINTOX) 200-200-20 MG/5ML suspension Take 30 mLs by mouth daily as needed for indigestion or heartburn (max 4 doses per 24 hours).  . carboxymethylcellulose (REFRESH PLUS) 0.5 % SOLN Place 1 drop into both eyes 2 (two) times daily.  . Cholecalciferol (D3-1000) 25 MCG (1000 UT) tablet Take 1,000 Units by mouth daily.   Marland Kitchen guaifenesin (ROBITUSSIN) 100 MG/5ML syrup Take 200 mg by mouth every 6 (six) hours as needed for cough.  . levothyroxine (SYNTHROID) 125 MCG tablet Take 125 mcg by mouth daily before breakfast.   . loperamide (IMODIUM) 2 MG capsule Take 2 mg by mouth 4 (four) times daily as needed for diarrhea or loose stools.   . magnesium hydroxide (MILK OF MAGNESIA) 400 MG/5ML suspension Take 30 mLs by mouth at bedtime as needed for mild constipation.   Marland Kitchen neomycin-bacitracin-polymyxin (NEOSPORIN) OINT Apply 1 application topically as needed for wound care.  . polyethylene glycol (MIRALAX / GLYCOLAX) 17 g packet Take 17 g by mouth daily as needed for mild constipation.   No facility-administered encounter medications on file as of 01/17/2020.      Avant Printy Marlena Clipper, NP

## 2020-01-27 ENCOUNTER — Emergency Department: Payer: Medicare PPO

## 2020-01-27 ENCOUNTER — Emergency Department
Admission: EM | Admit: 2020-01-27 | Discharge: 2020-01-27 | Disposition: A | Discharge status: Home / Self Care | Payer: Medicare PPO | Attending: Emergency Medicine | Admitting: Emergency Medicine

## 2020-01-27 ENCOUNTER — Other Ambulatory Visit: Payer: Self-pay

## 2020-01-27 DIAGNOSIS — Z96642 Presence of left artificial hip joint: Secondary | ICD-10-CM | POA: Diagnosis not present

## 2020-01-27 DIAGNOSIS — F0281 Dementia in other diseases classified elsewhere with behavioral disturbance: Secondary | ICD-10-CM | POA: Insufficient documentation

## 2020-01-27 DIAGNOSIS — Y939 Activity, unspecified: Secondary | ICD-10-CM | POA: Diagnosis not present

## 2020-01-27 DIAGNOSIS — W06XXXA Fall from bed, initial encounter: Secondary | ICD-10-CM | POA: Diagnosis not present

## 2020-01-27 DIAGNOSIS — Z79899 Other long term (current) drug therapy: Secondary | ICD-10-CM | POA: Diagnosis not present

## 2020-01-27 DIAGNOSIS — G309 Alzheimer's disease, unspecified: Secondary | ICD-10-CM | POA: Diagnosis not present

## 2020-01-27 DIAGNOSIS — Y92122 Bedroom in nursing home as the place of occurrence of the external cause: Secondary | ICD-10-CM | POA: Insufficient documentation

## 2020-01-27 DIAGNOSIS — Y999 Unspecified external cause status: Secondary | ICD-10-CM | POA: Diagnosis not present

## 2020-01-27 DIAGNOSIS — M25552 Pain in left hip: Secondary | ICD-10-CM | POA: Diagnosis not present

## 2020-01-27 DIAGNOSIS — W19XXXA Unspecified fall, initial encounter: Secondary | ICD-10-CM

## 2020-01-27 LAB — BASIC METABOLIC PANEL
Anion gap: 8 (ref 5–15)
BUN: 27 mg/dL — ABNORMAL HIGH (ref 8–23)
CO2: 27 mmol/L (ref 22–32)
Calcium: 9.7 mg/dL (ref 8.9–10.3)
Chloride: 104 mmol/L (ref 98–111)
Creatinine, Ser: 0.54 mg/dL (ref 0.44–1.00)
GFR calc Af Amer: >60 mL/min (ref >60)
GFR calc non Af Amer: >60 mL/min (ref >60)
Glucose, Bld: 88 mg/dL (ref 70–99)
Potassium: 4 mmol/L (ref 3.5–5.1)
Sodium: 139 mmol/L (ref 135–145)

## 2020-01-27 LAB — CBC
HCT: 41 % (ref 36.0–46.0)
Hemoglobin: 13.3 g/dL (ref 12.0–15.0)
MCH: 31.7 pg (ref 26.0–34.0)
MCHC: 32.4 g/dL (ref 30.0–36.0)
MCV: 97.9 fL (ref 80.0–100.0)
Platelets: 234 10*3/uL (ref 150–400)
RBC: 4.19 MIL/uL (ref 3.87–5.11)
RDW: 12.6 % (ref 11.5–15.5)
WBC: 6.1 10*3/uL (ref 4.0–10.5)
nRBC: 0 % (ref 0.0–0.2)

## 2020-01-27 MED ORDER — SODIUM CHLORIDE 0.9 % IV BOLUS
500.0000 mL | Freq: Once | INTRAVENOUS | Status: AC
  Administered 2020-01-27: 500 mL via INTRAVENOUS

## 2020-01-27 NOTE — ED Notes (Signed)
This RN and EDP at bedside to assess patient. Pt's pants removed by this RN and EDP. Pt tolerated well. BP cuff and EKG leads removed per EDP. Pt alert, mostly non-verbal per baseline. Purewick applied at this time to patient. Pt repositioned in bed at this time for comfort.

## 2020-01-27 NOTE — ED Provider Notes (Signed)
[facility] Emergency Department Provider Note   ____________________________________________   First MD Initiated Contact with Patient 01/27/20 [phone]     (approximate)  I have reviewed the triage vital signs and the nursing notes.   HISTORY  Chief Complaint Fall  EM caveat patient has severe dementia  HPI Sydney Turner is a 79 y.o. female   who per EMS was pulled out of bed by her roommate, fell onto her left side and was believed to be experiencing some pain on the left hip.  Patient's family reports approximately same history, that she was pulled from bed and nursing home reportedly felt like she was having some pain in her left hip.  There is no note of loss of consciousness, but no one was present to be able to write history when the actual event occurred.  Patient's family including her daughter at the bedside reports that she is acting and behaving normally.  She normally lays calmly, does not speak at all or hardly at all due to severe dementia.  I have spoken with both of the patient's daughters and they reports that her goals of care are for comfort, and they wish to minimize any unnecessary testing.  They really wish to make sure that she has not have any injury to the hip  She has been in her normal health which is severely severe debilitated disease primarily due to dementia  Past Medical History:  Diagnosis Date  . 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 Aphasia   . Dementia (HCC)   . Dementia with behavioral disturbance (HCC)   . Osteoporosis     Patient Active Problem List   Diagnosis Date Noted  . Closed left hip fracture (HCC) 05/08/2019  . Sepsis secondary to UTI (HCC) 02/03/2017    Past Surgical History:  Procedure Laterality Date  . CATARACT EXTRACTION    . COLONOSCOPY    . HIP ARTHROPLASTY Left 05/09/2019   Procedure: ARTHROPLASTY BIPOLAR HIP (HEMIARTHROPLASTY);  Surgeon: Juanell Fairly, MD;   Location: [facility] ORS;  Service: Orthopedics;  Laterality: Left;    Prior to Admission medications   Medication Sig Start Date End Date Taking? Authorizing Provider  acetaminophen (TYLENOL) 325 MG tablet Take 650 mg by mouth 3 (three) times daily.    [provider]  acetaminophen (TYLENOL) 500 MG tablet Take 500 mg by mouth every 4 (four) hours as needed for mild pain or fever.    [provider]  alum & mag hydroxide-simeth (MINTOX) 200-200-20 MG/5ML suspension Take 30 mLs by mouth daily as needed for indigestion or heartburn (max 4 doses per 24 hours).    [provider]  carboxymethylcellulose (REFRESH PLUS) 0.5 % SOLN Place 1 drop into both eyes 2 (two) times daily.    [provider]  Cholecalciferol (D3-1000) 25 MCG (1000 UT) tablet Take 1,000 Units by mouth daily.     [provider]  guaifenesin (ROBITUSSIN) 100 MG/5ML syrup Take 200 mg by mouth every 6 (six) hours as needed for cough.    [provider]  levothyroxine (SYNTHROID) 125 MCG tablet Take 125 mcg by mouth daily before breakfast.     [provider]  loperamide (IMODIUM) 2 MG capsule Take 2 mg by mouth 4 (four) times daily as needed for diarrhea or loose stools.     [provider]  magnesium hydroxide (MILK OF MAGNESIA) 400 MG/5ML suspension Take 30 mLs by mouth at bedtime as needed  for mild constipation.     [provider]  neomycin-bacitracin-polymyxin (NEOSPORIN) OINT Apply 1 application topically as needed for wound care.    [provider]  polyethylene glycol (MIRALAX / GLYCOLAX) 17 g packet Take 17 g by mouth daily as needed for mild constipation. 05/15/19   Gladstone Lighter, MD    Allergies Sulfa antibiotics, Amoxicillin, and Penicillins  No family history on file.  Social History Social History   Tobacco Use  . Smoking status: Never Smoker  . Smokeless tobacco: Never Used  Substance Use Topics  . Alcohol use: No  .  Drug use: No    Review of Systems  No known recent illness.  Currently acting behaving to her normal per the daughter    ____________________________________________   PHYSICAL EXAM:  VITAL SIGNS: ED Triage Vitals [01/27/20 0815]  Enc Vitals Group     BP (!) 142/60     Pulse Rate 74     Resp 16     Temp (!) 97.5 F (36.4 C)     Temp Source Axillary     SpO2 99 %     Weight      Height      Head Circumference      Peak Flow      Pain Score      Pain Loc      Pain Edu?      Excl. in [place]?     Constitutional: Alert aphasic.  Likely not oriented.  Lays calmly on her right side, but does allow for repositioning but seems to stay in whatever position she is repositioned to. Eyes: Conjunctivae are normal. Head: Atraumatic. Nose: No congestion/rhinnorhea. Mouth/Throat: Mucous membranes are moist. Neck: No stridor.  Cardiovascular: Normal rate, regular rhythm. Grossly normal heart sounds.  Good peripheral circulation. Respiratory: Normal respiratory effort.  No retractions. Lungs CTAB. Gastrointestinal: Soft and nontender. No distention. Musculoskeletal: No lower extremity tenderness nor edema.  Able to move all extremities but very weak and frail in all extremities.  There does not appear to be any shortening or rotation of any of the legs or hip joints.  There is no focal pain or tenderness over any of the long bones of upper or lower extremity.  Well perfused in all extremities.  No focal pain or deformity noted over the left hip left pelvic region or careful examination of the long bones lower extremities and upper extremities bilateral.  No bruising or contusions. Neurologic: Aphasic.  Very flat face. Skin:  Skin is warm, dry and intact. No rash noted. Psychiatric: Mood and affect are very calm placid  ____________________________________________   LABS (all labs ordered are listed, but only abnormal results are displayed)  Labs Reviewed  BASIC METABOLIC PANEL -  Abnormal; Notable for the following components:      Result Value   BUN 27 (*)    All other components within normal limits  CBC   ____________________________________________  EKG   ____________________________________________  RADIOLOGY  DG HIP UNILAT WITH PELVIS 2-3 VIEWS LEFT  Result Date: 01/27/2020 CLINICAL DATA:  Un witnessed fall, previous hip surgery EXAM: DG HIP (WITH OR WITHOUT PELVIS) 2-3V LEFT COMPARISON:  Postoperative evaluation 05/09/2019 FINDINGS: Catheter projects over the central pelvis. AP view without signs of acute right hip abnormality. Left hip arthroplasty with cerclage wire in place about the proximal femur. No signs of fracture or dislocation. IMPRESSION: Left hemiarthroplasty without signs of acute fracture or dislocation. Electronically Signed   By: Jewel Baize.D.  On: 01/27/2020 11:18    Imaging reviewed negative for acute fracture ____________________________________________   PROCEDURES  Procedure(s) performed: None  Procedures  Critical Care performed: No  ____________________________________________   INITIAL IMPRESSION / ASSESSMENT AND PLAN / ED COURSE  Pertinent labs & imaging results that were available during my care of the patient were reviewed by me and considered in my medical decision making (see chart for details).   Patient presents after fall out of bed.  Appears to be at her baseline without evidence of acute injury.  Initial labs ordered due to patient's history of dementia, but after further discussion with family I canceled some for testing including CT of the head as they do not wish for this.  Her daughter reports that she makes medical decisions along with her other daughter both of whom I have spoken to one at the bedside are in agreement that they wish to primarily provide her comfort and just make sure that there is no evidence of injury to the left hip primarily after today's episode.  I think is very reasonable  given her goals of care.  She does not appear to be in acute distress or pain at this time in my low pretest probability for hip fracture  Clinical Course as of Jan 26 1129  Sat Jan 27, 2020  0819 Discussed   [MQ]  0819 Daughter, Gary Fleet, advises they (family) do not want any CT scans given her dementia. They are ok with getting x-rays of the left.    [MQ]    Clinical Course User Index [MQ] Sharyn Creamer, MD    ----------------------------------------- 11:29 AM on 01/27/2020 -----------------------------------------  X-ray of the left hip reassuring.  No evidence of injury.  Does not appear any signs of obvious injury from the fall today.  In context of the patient's goals of care as well, I think is appropriate for discharge back to her care facility with family.  Return precautions and treatment recommendations and follow-up discussed with the patient's daughter who is agreeable with the plan.  ____________________________________________   FINAL CLINICAL IMPRESSION(S) / ED DIAGNOSES  Final diagnoses:  Fall, initial encounter        Note:  This document was prepared using Conservation officer, historic buildings and may include unintentional dictation errors       Sharyn Creamer, MD 01/27/20 1130

## 2020-01-27 NOTE — ED Triage Notes (Signed)
Pt arrives via ems from [facility] after being pulled out of bed onto the floor by her roommate. Pt was found on the floor holding her left hip area, pt has a hx of dementia and is mostly  Non verbal

## 2020-02-14 ENCOUNTER — Non-Acute Institutional Stay: Payer: Medicare PPO | Admitting: Adult Health Nurse Practitioner

## 2020-02-14 ENCOUNTER — Other Ambulatory Visit: Payer: Self-pay

## 2020-02-14 DIAGNOSIS — F0280 Dementia in other diseases classified elsewhere without behavioral disturbance: Secondary | ICD-10-CM

## 2020-02-14 DIAGNOSIS — Z515 Encounter for palliative care: Secondary | ICD-10-CM

## 2020-02-14 DIAGNOSIS — G309 Alzheimer's disease, unspecified: Secondary | ICD-10-CM
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
## 2020-03-13 ENCOUNTER — Other Ambulatory Visit: Payer: Self-pay

## 2020-03-13 ENCOUNTER — Non-Acute Institutional Stay: Payer: Medicare PPO | Admitting: Adult Health Nurse Practitioner

## 2020-03-13 DIAGNOSIS — G309 Alzheimer's disease, unspecified: Secondary | ICD-10-CM

## 2020-03-13 DIAGNOSIS — F0280 Dementia in other diseases classified elsewhere without behavioral disturbance: Secondary | ICD-10-CM

## 2020-03-13 DIAGNOSIS — Z515 Encounter for palliative care: Secondary | ICD-10-CM

## 2020-03-13 NOTE — Progress Notes (Signed)
[facility] Consult Note Telephone: [phone]  Fax: [phone]  PATIENT NAME: Sydney Turner DOB: [birth year] MRN: [id number]  PRIMARY CARE PROVIDER:   [facility]  REFERRING PROVIDER:  [facility] [address] [place],  [place] [address]  RESPONSIBLE PARTY:   Extended Emergency Contact Information Primary Emergency Contact: Sydney Turner [place] of [place] Mobile Phone: [phone] Relation: Daughter Secondary Emergency Contact: Sydney Turner Mobile Phone: [phone] Relation: Daughter        RECOMMENDATIONS and PLAN:  1.  Advanced care planning. Patient is a DNR.  Called daughter to update on visit.  Left VM with reason for call and contact info for return call.  2.  Dementia.  FAST 7b.  Patient cannot get up unassisted but staff keeps her close as she does try to get up unassisted.  She is nonverbal.  Does not follow all commands.  No reported weight loss.  Staff assist with feeding.  Continue supportive care at facility.   Patient does not appear to be in any pain. No concerns reported by staff today. Denies SOB, cough, fever, N/V/D, constipation. Palliative care will continue to monitor for symptom management and decline and make recommendations as needed.  Will follow up in 4-6 weeks.  I spent 30 minutes providing this consultation,  from 9:30 to 10:00 including time spent with patient/family, chart review, provider coordination, documentation. More than 50% of the time in this consultation was spent coordinating communication.   HISTORY OF PRESENT ILLNESS:  Sydney Turner is a 79 y.o. year old female with multiple medical problems including Alzheimer's dementia, progressive aphasia, osteoporosis. Palliative Care was asked to help address goals of care.   CODE STATUS: DNR  PPS: 30% HOSPICE ELIGIBILITY/DIAGNOSIS: TBD  PHYSICAL EXAM:   General: NAD, frail  appearing Cardiovascular: regular rate and rhythm Pulmonary:lung sounds clear; normal respiratory rate Abdomen: soft, nontender, + bowel sounds GU: no suprapubic tenderness Extremities: no edema, no joint deformities Skin: no rashes Neurological: Weakness; nonverbal  PAST MEDICAL HISTORY:  Past Medical History:  Diagnosis Date  . Alzheimer's dementia ([place])    With Primary Progressive Aphasia  . Aphasia   . Dementia ([place])   . Dementia with behavioral disturbance ([place])   . Osteoporosis     SOCIAL HX:  Social History   Tobacco Use  . Smoking status: Never Smoker  . Smokeless tobacco: Never Used  Substance Use Topics  . Alcohol use: No    ALLERGIES:  Allergies  Allergen Reactions  . Sulfa Antibiotics Swelling    Edema  . Amoxicillin     Has patient had a PCN reaction causing immediate rash, facial/tongue/throat swelling, SOB or lightheadedness with hypotension: Unknown Has patient had a PCN reaction causing severe rash involving mucus membranes or skin necrosis: Unknown Has patient had a PCN reaction that required hospitalization: Unknown Has patient had a PCN reaction occurring within the last 10 years: Unknown If all of the above answers are "NO", then may proceed with Cephalosporin use.   Marland Kitchen Penicillins Nausea And Vomiting    Has patient had a PCN reaction causing immediate rash, facial/tongue/throat swelling, SOB or lightheadedness with hypotension: Unknown Has patient had a PCN reaction causing severe rash involving mucus membranes or skin necrosis: Unknown Has patient had a PCN reaction that required hospitalization: Unknown Has patient had a PCN reaction occurring within the last 10 years: Unknown If all of the above answers are "NO", then may proceed with Cephalosporin use.  PERTINENT MEDICATIONS:  Outpatient Encounter Medications as of 03/13/2020  Medication Sig  . acetaminophen (TYLENOL) 325 MG tablet Take 650 mg by mouth 3 (three) times daily.  Marland Kitchen  acetaminophen (TYLENOL) 500 MG tablet Take 500 mg by mouth every 4 (four) hours as needed for mild pain or fever.  Marland Kitchen alum & mag hydroxide-simeth (MINTOX) 200-200-20 MG/5ML suspension Take 30 mLs by mouth daily as needed for indigestion or heartburn (max 4 doses per 24 hours).  . carboxymethylcellulose (REFRESH PLUS) 0.5 % SOLN Place 1 drop into both eyes 2 (two) times daily.  . Cholecalciferol (D3-1000) 25 MCG (1000 UT) tablet Take 1,000 Units by mouth daily.   Marland Kitchen guaifenesin (ROBITUSSIN) 100 MG/5ML syrup Take 200 mg by mouth every 6 (six) hours as needed for cough.  . levothyroxine (SYNTHROID) 125 MCG tablet Take 125 mcg by mouth daily before breakfast.   . loperamide (IMODIUM) 2 MG capsule Take 2 mg by mouth 4 (four) times daily as needed for diarrhea or loose stools.   . magnesium hydroxide (MILK OF MAGNESIA) 400 MG/5ML suspension Take 30 mLs by mouth at bedtime as needed for mild constipation.   Marland Kitchen neomycin-bacitracin-polymyxin (NEOSPORIN) OINT Apply 1 application topically as needed for wound care.  . polyethylene glycol (MIRALAX / GLYCOLAX) 17 g packet Take 17 g by mouth daily as needed for mild constipation.   No facility-administered encounter medications on file as of 03/13/2020.     Sydney Marlena Clipper, NP

## 2020-04-03 ENCOUNTER — Other Ambulatory Visit: Payer: Self-pay

## 2020-04-03 ENCOUNTER — Non-Acute Institutional Stay: Payer: Medicare PPO | Admitting: Adult Health Nurse Practitioner

## 2020-04-03 DIAGNOSIS — F0280 Dementia in other diseases classified elsewhere without behavioral disturbance: Secondary | ICD-10-CM

## 2020-04-03 DIAGNOSIS — Z515 Encounter for palliative care: Secondary | ICD-10-CM

## 2020-04-03 NOTE — Progress Notes (Signed)
[facility] Consult Note Telephone: [phone]  Fax: [phone]  PATIENT NAME: Sydney Turner DOB: 12-31-1940 MRN: [id number]  PRIMARY CARE PROVIDER:   Orvis Brill[facility]  REFERRING PROVIDER:  [facility] [address],  [place] [address]  RESPONSIBLE PARTY:  Extended Emergency Contact Information Primary Emergency Contact: Sydney Turner States of [place] Mobile Phone: [phone] Relation: Daughter Secondary Emergency Contact: Sydney Turner,Sydney Turner Mobile Phone: [phone] Relation: Daughter         RECOMMENDATIONS and PLAN:  1.  Advanced care planning. Patient is a DNR.  Called daughter to update on visit.  Left VM with reason for call and contact info for return call.  2.  Dementia. FAST 7b. Patient cannot get up unassisted but staff keeps her close as she does try to get up unassisted.  She is nonverbal.  Does not follow all commands.  No reported weight loss.  Current weight stable at 112 pounds. Staff assist with feeding.  Continue supportive care at facility  Patient does not appear to be in any pain. No reported falls, infection, or hospital visits since last visit. No concerns reported by staff today. Denies SOB, cough, fever, N/V/D, constipation. Palliative care will continue to monitor for symptom management and decline and make recommendations as needed.Will follow up in 4-6 weeks.  I spent 30 minutes providing this consultation,  from 10:20 to 10:50 including time spent with patient/family, chart review, provider coordination, documentation. More than 50% of the time in this consultation was spent coordinating communication.   HISTORY OF PRESENT ILLNESS:  Sydney Turner is a 79 y.o. year old female with multiple medical problems including Alzheimer's dementia, progressive aphasia, osteoporosis. Palliative Care was asked to help address goals of care.   CODE  STATUS: DNR  PPS: 30% HOSPICE ELIGIBILITY/DIAGNOSIS: TBD  PHYSICAL EXAM:  HR 76  O2 98% on RA General: NAD, frail appearing Cardiovascular: regular rate and rhythm Pulmonary:lung sounds clear; normal respiratory rate Abdomen: soft, nontender, + bowel sounds GU: no suprapubic tenderness Extremities: no edema, no joint deformities Skin: no rashes Neurological: Weakness; nonverbal  PAST MEDICAL HISTORY:  Past Medical History:  Diagnosis Date  . Alzheimer's dementia ([facility])    With Primary Progressive Aphasia  . Aphasia   . Dementia ([facility])   . Dementia with behavioral disturbance ([facility])   . Osteoporosis     SOCIAL HX:  Social History   Tobacco Use  . Smoking status: Never Smoker  . Smokeless tobacco: Never Used  Substance Use Topics  . Alcohol use: No    ALLERGIES:  Allergies  Allergen Reactions  . Sulfa Antibiotics Swelling    Edema  . Amoxicillin     Has patient had a PCN reaction causing immediate rash, facial/tongue/throat swelling, SOB or lightheadedness with hypotension: Unknown Has patient had a PCN reaction causing severe rash involving mucus membranes or skin necrosis: Unknown Has patient had a PCN reaction that required hospitalization: Unknown Has patient had a PCN reaction occurring within the last 10 years: Unknown If all of the above answers are "NO", then may proceed with Cephalosporin use.   Marland Kitchen Penicillins Nausea And Vomiting    Has patient had a PCN reaction causing immediate rash, facial/tongue/throat swelling, SOB or lightheadedness with hypotension: Unknown Has patient had a PCN reaction causing severe rash involving mucus membranes or skin necrosis: Unknown Has patient had a PCN reaction that required hospitalization: Unknown Has patient had a PCN reaction occurring within the last  10 years: Unknown If all of the above answers are "NO", then may proceed with Cephalosporin use.      PERTINENT MEDICATIONS:  Outpatient Encounter Medications as of  04/03/2020  Medication Sig  . acetaminophen (TYLENOL) 325 MG tablet Take 650 mg by mouth 3 (three) times daily.  Marland Kitchen acetaminophen (TYLENOL) 500 MG tablet Take 500 mg by mouth every 4 (four) hours as needed for mild pain or fever.  Marland Kitchen alum & mag hydroxide-simeth (MINTOX) 200-200-20 MG/5ML suspension Take 30 mLs by mouth daily as needed for indigestion or heartburn (max 4 doses per 24 hours).  . carboxymethylcellulose (REFRESH PLUS) 0.5 % SOLN Place 1 drop into both eyes 2 (two) times daily.  . Cholecalciferol (D3-1000) 25 MCG (1000 UT) tablet Take 1,000 Units by mouth daily.   Marland Kitchen guaifenesin (ROBITUSSIN) 100 MG/5ML syrup Take 200 mg by mouth every 6 (six) hours as needed for cough.  . levothyroxine (SYNTHROID) 125 MCG tablet Take 125 mcg by mouth daily before breakfast.   . loperamide (IMODIUM) 2 MG capsule Take 2 mg by mouth 4 (four) times daily as needed for diarrhea or loose stools.   . magnesium hydroxide (MILK OF MAGNESIA) 400 MG/5ML suspension Take 30 mLs by mouth at bedtime as needed for mild constipation.   Marland Kitchen neomycin-bacitracin-polymyxin (NEOSPORIN) OINT Apply 1 application topically as needed for wound care.  . polyethylene glycol (MIRALAX / GLYCOLAX) 17 g packet Take 17 g by mouth daily as needed for mild constipation.   No facility-administered encounter medications on file as of 04/03/2020.     Levelle Edelen Marlena Clipper, NP

## 2020-05-15 ENCOUNTER — Other Ambulatory Visit: Payer: Self-pay

## 2020-05-15 ENCOUNTER — Non-Acute Institutional Stay: Payer: Medicare Other | Admitting: Adult Health Nurse Practitioner

## 2020-05-15 DIAGNOSIS — Z515 Encounter for palliative care: Secondary | ICD-10-CM

## 2020-05-15 DIAGNOSIS — F0280 Dementia in other diseases classified elsewhere without behavioral disturbance: Secondary | ICD-10-CM

## 2020-05-15 NOTE — Progress Notes (Signed)
Therapist, nutritional Palliative Care Consult Note Telephone: [phone]  Fax: [phone]  PATIENT NAME: Sydney Turner DOB: [birth year] MRN: [id number]  PRIMARY CARE PROVIDER:   Almetta Lovely, [facility]  REFERRING PROVIDER:  [facility] [address] [place],  [place] [address]  RESPONSIBLE PARTY:   Extended Emergency Contact Information Primary Emergency Contact: Eustaquio Maize States of [place] Mobile Phone: [phone] Relation: Daughter Secondary Emergency Contact: Rogue,sherry Mobile Phone: [phone] Relation: Daughter       RECOMMENDATIONS and PLAN:  1.  Advanced care planning. Patient is a DNR. Will call daughter to update on visit  2.  Dementia. FAST 7b. Patient cannot get up unassisted but staff keeps her close as she does try to get up unassisted. She is nonverbal.Does not follow all commands.  Patient is a feeder and staff reports that she eats 100% of her meals most of the time.  No reported weight loss.  Staff does report that she is becoming more resistant to care but does not seem as if she is in pain.  She used to be able with assistance to stand up and hold onto a bar to help with care and now she is not doing that.  Continue supportive care at facility.  Patient having slow functional decline related to dementia.  As she gets weaker she may be feeling the fear of falling when staff do try to get her up.  No reports of falls, infection, or hospital visits since last visit.  Denies S OB, cough, fever, N/V/D, constipation.  Palliative will continue to monitor for symptom management/decline and make recommendations as needed.  Follow-up in 6 to 8 weeks.    I spent 30 minutes providing this consultation,  from 1:10 to 1:40 including time spent with patient/family, chart review, provider coordination, documentation. More than 50% of the time in this consultation was spent coordinating  communication.   HISTORY OF PRESENT ILLNESS:  DILLIE BURANDT is a 79 y.o. year old female with multiple medical problems including Alzheimer's dementia, progressive aphasia, osteoporosis. Palliative Care was asked to help address goals of care.   CODE STATUS: DNR  PPS: 30% HOSPICE ELIGIBILITY/DIAGNOSIS: TBD  PHYSICAL EXAM:  HR 92  O2 100% on RA General: NAD, frail appearing Cardiovascular: regular rate and rhythm Pulmonary:lung sounds clear; normal respiratory rate Abdomen: soft, nontender, + bowel sounds GU: no suprapubic tenderness Extremities: no edema, no joint deformities Skin: no rashes Neurological: Weakness; nonverbal  PAST MEDICAL HISTORY:  Past Medical History:  Diagnosis Date   Alzheimer's dementia ([facility])    With Primary Progressive Aphasia   Aphasia    Dementia ([facility])    Dementia with behavioral disturbance ([facility])    Osteoporosis     SOCIAL HX:  Social History   Tobacco Use   Smoking status: Never Smoker   Smokeless tobacco: Never Used  Substance Use Topics   Alcohol use: No    ALLERGIES:  Allergies  Allergen Reactions   Sulfa Antibiotics Swelling    Edema   Amoxicillin     Has patient had a PCN reaction causing immediate rash, facial/tongue/throat swelling, SOB or lightheadedness with hypotension: Unknown Has patient had a PCN reaction causing severe rash involving mucus membranes or skin necrosis: Unknown Has patient had a PCN reaction that required hospitalization: Unknown Has patient had a PCN reaction occurring within the last 10 years: Unknown If all of the above answers are "NO", then may proceed with Cephalosporin use.  Penicillins Nausea And Vomiting    Has patient had a PCN reaction causing immediate rash, facial/tongue/throat swelling, SOB or lightheadedness with hypotension: Unknown Has patient had a PCN reaction causing severe rash involving mucus membranes or skin necrosis: Unknown Has patient had a PCN reaction that  required hospitalization: Unknown Has patient had a PCN reaction occurring within the last 10 years: Unknown If all of the above answers are "NO", then may proceed with Cephalosporin use.      PERTINENT MEDICATIONS:  Outpatient Encounter Medications as of 05/15/2020  Medication Sig   acetaminophen (TYLENOL) 325 MG tablet Take 650 mg by mouth 3 (three) times daily.   acetaminophen (TYLENOL) 500 MG tablet Take 500 mg by mouth every 4 (four) hours as needed for mild pain or fever.   alum & mag hydroxide-simeth (MINTOX) 200-200-20 MG/5ML suspension Take 30 mLs by mouth daily as needed for indigestion or heartburn (max 4 doses per 24 hours).   carboxymethylcellulose (REFRESH PLUS) 0.5 % SOLN Place 1 drop into both eyes 2 (two) times daily.   Cholecalciferol (D3-1000) 25 MCG (1000 UT) tablet Take 1,000 Units by mouth daily.    guaifenesin (ROBITUSSIN) 100 MG/5ML syrup Take 200 mg by mouth every 6 (six) hours as needed for cough.   levothyroxine (SYNTHROID) 125 MCG tablet Take 125 mcg by mouth daily before breakfast.    loperamide (IMODIUM) 2 MG capsule Take 2 mg by mouth 4 (four) times daily as needed for diarrhea or loose stools.    magnesium hydroxide (MILK OF MAGNESIA) 400 MG/5ML suspension Take 30 mLs by mouth at bedtime as needed for mild constipation.    neomycin-bacitracin-polymyxin (NEOSPORIN) OINT Apply 1 application topically as needed for wound care.   polyethylene glycol (MIRALAX / GLYCOLAX) 17 g packet Take 17 g by mouth daily as needed for mild constipation.   No facility-administered encounter medications on file as of 05/15/2020.      Kentavious Michele Marlena Clipper, NP

## 2020-05-16 ENCOUNTER — Telehealth: Payer: Self-pay | Admitting: Adult Health Nurse Practitioner

## 2020-05-16 NOTE — Telephone Encounter (Signed)
Spoke with daughter and updated on visit.  Her mother is having functional decline and is becoming a little fearful with standing which is causing some resistance with staff giving care.  Daughter has head cold and wanted to make sure her mom was not having any symptoms.  Her mother has no symptoms of a cold and this was reassuring to the daughter.  Encouraged to call with any questions or concerns Kiffany Schelling K. Garner Nash NP

## 2020-05-16 NOTE — Telephone Encounter (Signed)
Called daughter to update on yesterday's visit.  Left VM with reason for call and call back info Theoren Palka K. Aleecia Tapia NP 

## 2020-06-26 ENCOUNTER — Other Ambulatory Visit: Payer: Self-pay

## 2020-06-26 ENCOUNTER — Non-Acute Institutional Stay: Payer: Medicare Other | Admitting: Adult Health Nurse Practitioner

## 2020-06-26 DIAGNOSIS — G309 Alzheimer's disease, unspecified: Secondary | ICD-10-CM

## 2020-06-26 DIAGNOSIS — F0280 Dementia in other diseases classified elsewhere without behavioral disturbance: Secondary | ICD-10-CM

## 2020-06-26 DIAGNOSIS — Z515 Encounter for palliative care: Secondary | ICD-10-CM

## 2020-06-26 NOTE — Progress Notes (Signed)
Therapist, nutritional Palliative Care Consult Note Telephone: [phone]  Fax: [phone]  PATIENT NAME: Sydney Turner DOB: Mar 06, 1941 MRN: [id number]  PRIMARY CARE PROVIDER:   Almetta Lovely, [facility]  REFERRING PROVIDER:  [facility] [address] [place],  [place] [address]  RESPONSIBLE PARTY:   Extended Emergency Contact Information Primary Emergency Contact: Eustaquio Maize States of [place] Mobile Phone: [phone] Relation: Daughter Secondary Emergency Contact: Aguillard,sherry Mobile Phone: [phone] Relation: Daughter    RECOMMENDATIONS and PLAN: 1.Advanced care planning. Patient is a DNR.Called daughter to update on visit.  Left VM with reason for call and left contact info  2.  Dementia.  FAST 7b.  Patient is unable to get up without assistance.  She is requiring more assistance with ADLs and is unable to assist with transfers anymore.  She is in the process of transitioning to SNF.She is nonverbal.Does not follow all commands.  Patient is a feeder and staff reports that she eats 100% of her meals most of the time.  No reported weight loss. Continue supportive care at facility.  No reports of falls, infection, or hospital visits since last visit.  Denies SOB, cough, fever, N/V/D, constipation.  Palliative will continue to monitor for symptom management/decline and make recommendations as needed.  Follow-up in 6 to 8 weeks.  I spent 30 minutes providing this consultation,  from 10:00 to 10:30 including time spent with patient/family, chart review, provider coordination, documentation. More than 50% of the time in this consultation was spent coordinating communication.   HISTORY OF PRESENT ILLNESS:  Sydney Turner is a 79 y.o. year old female with multiple medical problems including Alzheimer's dementia, progressive aphasia, osteoporosis. Palliative Care was asked to help address goals of care.    CODE STATUS: DNR  PPS: 30% HOSPICE ELIGIBILITY/DIAGNOSIS: TBD  PHYSICAL EXAM:   General: NAD, frail appearing Cardiovascular: regular rate and rhythm Pulmonary:lung sounds clear; normal respiratory rate Abdomen: soft, nontender, + bowel sounds GU: no suprapubic tenderness Extremities: no edema, no joint deformities Skin: no rashes on exposed skin Neurological: Weakness; nonverbal  PAST MEDICAL HISTORY:  Past Medical History:  Diagnosis Date   Alzheimer's dementia ([facility])    With Primary Progressive Aphasia   Aphasia    Dementia ([facility])    Dementia with behavioral disturbance ([facility])    Osteoporosis     SOCIAL HX:  Social History   Tobacco Use   Smoking status: Never Smoker   Smokeless tobacco: Never Used  Substance Use Topics   Alcohol use: No    ALLERGIES:  Allergies  Allergen Reactions   Sulfa Antibiotics Swelling    Edema   Amoxicillin     Has patient had a PCN reaction causing immediate rash, facial/tongue/throat swelling, SOB or lightheadedness with hypotension: Unknown Has patient had a PCN reaction causing severe rash involving mucus membranes or skin necrosis: Unknown Has patient had a PCN reaction that required hospitalization: Unknown Has patient had a PCN reaction occurring within the last 10 years: Unknown If all of the above answers are "NO", then may proceed with Cephalosporin use.    Penicillins Nausea And Vomiting    Has patient had a PCN reaction causing immediate rash, facial/tongue/throat swelling, SOB or lightheadedness with hypotension: Unknown Has patient had a PCN reaction causing severe rash involving mucus membranes or skin necrosis: Unknown Has patient had a PCN reaction that required hospitalization: Unknown Has patient had a PCN reaction occurring within the last 10 years: Unknown If all of  the above answers are "NO", then may proceed with Cephalosporin use.      PERTINENT MEDICATIONS:  Outpatient Encounter Medications as  of 06/26/2020  Medication Sig   acetaminophen (TYLENOL) 325 MG tablet Take 650 mg by mouth 3 (three) times daily.   acetaminophen (TYLENOL) 500 MG tablet Take 500 mg by mouth every 4 (four) hours as needed for mild pain or fever.   alum & mag hydroxide-simeth (MINTOX) 200-200-20 MG/5ML suspension Take 30 mLs by mouth daily as needed for indigestion or heartburn (max 4 doses per 24 hours).   carboxymethylcellulose (REFRESH PLUS) 0.5 % SOLN Place 1 drop into both eyes 2 (two) times daily.   Cholecalciferol (D3-1000) 25 MCG (1000 UT) tablet Take 1,000 Units by mouth daily.    guaifenesin (ROBITUSSIN) 100 MG/5ML syrup Take 200 mg by mouth every 6 (six) hours as needed for cough.   levothyroxine (SYNTHROID) 125 MCG tablet Take 125 mcg by mouth daily before breakfast.    loperamide (IMODIUM) 2 MG capsule Take 2 mg by mouth 4 (four) times daily as needed for diarrhea or loose stools.    magnesium hydroxide (MILK OF MAGNESIA) 400 MG/5ML suspension Take 30 mLs by mouth at bedtime as needed for mild constipation.    neomycin-bacitracin-polymyxin (NEOSPORIN) OINT Apply 1 application topically as needed for wound care.   polyethylene glycol (MIRALAX / GLYCOLAX) 17 g packet Take 17 g by mouth daily as needed for mild constipation.   No facility-administered encounter medications on file as of 06/26/2020.     Sydney Hines Marlena Clipper, NP

## 2020-12-16 ENCOUNTER — Other Ambulatory Visit: Payer: Self-pay | Admitting: Physician Assistant

## 2020-12-16 DIAGNOSIS — M549 Dorsalgia, unspecified: Secondary | ICD-10-CM

## 2020-12-30 ENCOUNTER — Other Ambulatory Visit: Payer: Self-pay

## 2020-12-30 ENCOUNTER — Ambulatory Visit
Admission: RE | Admit: 2020-12-30 | Discharge: 2020-12-30 | Disposition: A | Discharge status: Home / Self Care | Payer: Medicare Other | Source: Ambulatory Visit | Attending: Physician Assistant | Admitting: Physician Assistant

## 2020-12-30 DIAGNOSIS — M549 Dorsalgia, unspecified: Secondary | ICD-10-CM | POA: Insufficient documentation

## 2021-04-04 ENCOUNTER — Other Ambulatory Visit: Payer: Self-pay

## 2021-04-04 ENCOUNTER — Non-Acute Institutional Stay: Payer: Medicare Other | Admitting: Primary Care

## 2021-04-04 DIAGNOSIS — F0280 Dementia in other diseases classified elsewhere without behavioral disturbance: Secondary | ICD-10-CM

## 2021-04-04 DIAGNOSIS — Z515 Encounter for palliative care: Secondary | ICD-10-CM

## 2021-04-04 NOTE — Progress Notes (Signed)
[facility] Consult Note Telephone: [phone]  Fax: [phone]    Date of encounter: 04/04/21 PATIENT NAME: Sydney Turner [address] [place] [address]   [phone] (home)  DOB: Sep 15, 1941 MRN: [id number] PRIMARY CARE PROVIDER:    Alvester Morin, MD [address] [place],  [place] [address] [phone]   REFERRING PROVIDER:   Alvester Morin, MD [address] [place],  [place] [address] [phone]  RESPONSIBLE PARTY:    Contact Information     Name Relation Home Work [place] Daughter   [phone]   Simer,sherry Daughter   [phone]        I met face to face with patient and family in [facility] facility. Palliative Care was asked to follow this patient by consultation request of  Alvester Morin, MD  to address advance care planning and complex medical decision making. This is a follow up visit.                                   ASSESSMENT AND PLAN / RECOMMENDATIONS:   Advance Care Planning/Goals of Care: Goals include to maximize quality of life and symptom management. Our advance care planning conversation included a discussion about:    The value and importance of advance care planning  Exploration of personal, cultural or spiritual beliefs that might influence medical decisions  Exploration of goals of care in the event of a sudden injury or illness  Identification of a healthcare agent  Review and updating  of an  advance directive document . CODE STATUS: DNR Advance care plan I discussed power of attorney and  advance care planning with daughter [name].  She states patient has no advanced care plan, that she had one at one time but then threw it away and there were no other copies. She states that she has two other siblings and they all make care decisions. I discussed  possibly needing a guardianship in the future if she had to make decisions on her  mothers behalf unilaterally. We completed a new MOST  form because the old one had omitted date and signature. I have scanned it to [facility] and given it to the nursing home to upload and also put in physical chart.  The wishes are for do not resuscitate, comfort measures, use of anabiotics, use of IV fluids and no feeding tube.    Symptom Management/Plan:  I met with patient and her daughter in her nursing home room.   Pain: We discussed her chronic pain and its management,  as she has a lot of musculoskeletal issues and contractures  which are  be painful. She also has etiology of radicular pain. I would recommend around the clock Tylenol CR 650 mg TID. I'd recommend leaving the PRN dose for additional 500 mg Q8 hours PRN. This could be converted to the round the clock dose once establish. I would also recommend gabapentin 100 mg at bedtime and then begin to titrate if effective. Daughter states she wants to talk with care team about this at their upcoming care plan meeting so I let her know I would enter my recommendations.  She has seemingly failed baclofen and so a trial of gabapentin might provide some relief.  Follow up Palliative Care Visit: Palliative care will continue to follow for complex medical decision making, advance care planning, and clarification of goals. Return 4-6 weeks or  prn.  I spent 35 minutes providing this consultation. More than 50% of the time in this consultation was spent in counseling and care coordination.  PPS: 30%  HOSPICE ELIGIBILITY/DIAGNOSIS: TBD  Chief Complaint: debility  HISTORY OF PRESENT ILLNESS:  Sydney Turner is a 80 y.o. year old female  with dementia, debility, contractures, aphasia .   History obtained from review of EMR, discussion with primary team, and interview with family, facility staff/caregiver and/or Ms. Pike.   I reviewed available labs, medications, imaging, studies and related documents from the EMR.  Records reviewed and summarized  above.   ROS/family    General: NAD ENMT: denies dysphagia Cardiovascular: denies chest pain, denies DOE Pulmonary: denies cough, denies increased SOB Abdomen: endorses good appetite, denies constipation, endorses incontinence of bowel GU: denies dysuria, endorses incontinence of urine MSK:  endorses  weakness,  no falls reported Skin: denies rashes or wounds Neurological: endorses pain, denies insomnia Psych: Endorses positive mood Heme/lymph/immuno: denies bruises, abnormal bleeding  Physical Exam: Current and past weights: 124 lbs, gaining Constitutional: NAD, VSS General: frail appearing, thin EYES: anicteric sclera, lids intact, no discharge  ENMT: intact hearing, oral mucous membranes moist, dentition intact CV: S1S2, RRR, no LE edema Pulmonary: LCTA, no increased work of breathing, no cough, room air Abdomen: intake 100%, normo-active BS + 4 quadrants, soft and non tender, no ascites GU: deferred MSK: severe sarcopenia,  slowly moves all extremities, MSK deficits, non ambulatory Skin: warm and dry, no rashes or wounds on visible skin Neuro:  ++ generalized weakness,  ++ cognitive impairment Psych: non-anxious affect, A and O x 1-2 Hem/lymph/immuno: no widespread bruising  Outpatient Encounter Medications as of 04/04/2021  Medication Sig   acetaminophen (TYLENOL) 500 MG tablet Take 1,000 mg by mouth 2 (two) times daily.   alum & mag hydroxide-simeth (MAALOX/MYLANTA) 200-200-20 MG/5ML suspension Take 30 mLs by mouth daily as needed for indigestion or heartburn (max 4 doses per 24 hours).   Baclofen 5 MG TABS Take 1 tablet by mouth at bedtime.   carboxymethylcellulose (REFRESH PLUS) 0.5 % SOLN Place 1 drop into both eyes 2 (two) times daily.   Cholecalciferol 25 MCG (1000 UT) tablet Take 1,000 Units by mouth daily.    levothyroxine (SYNTHROID) 125 MCG tablet Take 125 mcg by mouth daily before breakfast.    Menthol, Topical Analgesic, (BIOFREEZE) 4 % GEL Apply topically  every 4 (four) hours as needed. To thighs   [DISCONTINUED] acetaminophen (TYLENOL) 325 MG tablet Take 650 mg by mouth 3 (three) times daily. (Patient not taking: Reported on 04/04/2021)   [DISCONTINUED] guaifenesin (ROBITUSSIN) 100 MG/5ML syrup Take 200 mg by mouth every 6 (six) hours as needed for cough.   [DISCONTINUED] loperamide (IMODIUM) 2 MG capsule Take 2 mg by mouth 4 (four) times daily as needed for diarrhea or loose stools.    [DISCONTINUED] magnesium hydroxide (MILK OF MAGNESIA) 400 MG/5ML suspension Take 30 mLs by mouth at bedtime as needed for mild constipation.    [DISCONTINUED] neomycin-bacitracin-polymyxin (NEOSPORIN) OINT Apply 1 application topically as needed for wound care.   [DISCONTINUED] polyethylene glycol (MIRALAX / GLYCOLAX) 17 g packet Take 17 g by mouth daily as needed for mild constipation.   No facility-administered encounter medications on file as of 04/04/2021.      Thank you for the opportunity to participate in the care of Ms. Rudin.  The palliative care team will continue to follow. Please call our office at [phone] if we can be of additional assistance.  Jason Coop, NP , DNP, MPH, AGPCNP-BC, ACHPN  COVID-19 PATIENT SCREENING TOOL Asked and negative response unless otherwise noted:   Have you had symptoms of covid, tested positive or been in contact with someone with symptoms/positive test in the past 5-10 days?

## 2021-05-09 ENCOUNTER — Non-Acute Institutional Stay: Payer: Medicare Other | Admitting: Primary Care

## 2021-05-09 ENCOUNTER — Other Ambulatory Visit: Payer: Self-pay

## 2021-05-09 DIAGNOSIS — F0280 Dementia in other diseases classified elsewhere without behavioral disturbance: Secondary | ICD-10-CM

## 2021-05-09 DIAGNOSIS — Z515 Encounter for palliative care: Secondary | ICD-10-CM

## 2021-05-09 NOTE — Progress Notes (Signed)
[facility] Consult Note Telephone: [phone]  Fax: [phone]    Date of encounter: 05/09/21 PATIENT NAME: Sydney Turner [address] [place] [address]   [phone] (home)  DOB: [birth year] MRN: [id number] PRIMARY CARE PROVIDER:    Alvester Morin, MD,  [place]. [place] [address] [phone]  REFERRING PROVIDER:   Alvester Morin, MD [place]. [place],  [place] [address] [phone]  RESPONSIBLE PARTY:    Contact Information     Name Relation Home Work [place] Daughter   [phone]   Kluge,sherry Daughter   [phone]      I met face to face with patient in [facility]. Palliative Care was asked to follow this patient by consultation request of  Slade-Hartman, Ivette Loyal* to address advance care planning and complex medical decision making. This is a follow up visit.                                   ASSESSMENT AND PLAN / RECOMMENDATIONS:   Advance Care Planning/Goals of Care: Goals include to maximize quality of life and symptom management.  CODE STATUS: DNR Called POA daughter Ms Jimmye Norman, no answer, message left.  Symptom Management/Plan:  I met with patient who was sitting up comfortably in her custom chair. She had had lunch. She was relaxed and did not appear to be in discomfort per PAINAD scale. She is non verbal but regarded me. Daughter was not present during visit. Staff states she is not exhibiting any discomforts at this time. Medications reviewed.  Follow up Palliative Care Visit: Palliative care will continue to follow for complex medical decision making, advance care planning, and clarification of goals. Return 4-6 weeks or prn.  I spent 25 minutes providing this consultation. More than 50% of the time in this consultation was spent in counseling and care coordination.  PPS: 30%  HOSPICE ELIGIBILITY/DIAGNOSIS: TBD  Chief  Complaint: debility  HISTORY OF PRESENT ILLNESS:  Sydney Turner is a 80 y.o. year old female  with Dementia, debility, aphasia .   History obtained from review of EMR, discussion with primary team, and interview with family, facility staff/caregiver and/or Ms. Siegenthaler.  I reviewed available labs, medications, imaging, studies and related documents from the EMR.  Records reviewed and summarized above.   ROS/staff  General: NAD ENMT: denies dysphagia Pulmonary: denies cough, denies increased SOB Abdomen: endorses good appetite, denies constipation, endorses incontinence of bowel GU: denies dysuria, endorses incontinence of urine MSK:  endorses weakness,  no falls reported Skin: denies rashes or wounds Neurological: denies pain, denies insomnia Psych: Endorses positive mood Heme/lymph/immuno: denies bruises, abnormal bleeding  Physical Exam: Current and past weights: 128 lbs Constitutional: NAD General: frail appearing,  WNWD EYES: anicteric sclera, lids intact, no discharge  ENMT: intact hearing, oral mucous membranes moist, dentition intact CV: S1S2, RRR, no LE edema Pulmonary: LCTA, no increased work of breathing, no cough, room air Abdomen: intake 75%, normo-active BS + 4 quadrants, soft and non tender, no ascites GU: deferred MSK: + sarcopenia,  non -ambulatory Skin: warm and dry, no rashes or wounds on visible skin Neuro:  ++generalized weakness,  ++ cognitive impairment Psych: non-anxious affect, A and O x 1 Hem/lymph/immuno: no widespread bruising  Outpatient Encounter Medications as of 05/09/2021  Medication Sig   acetaminophen (TYLENOL) 500 MG tablet Take 1,000 mg by mouth 2 (two) times  daily.   alum & mag hydroxide-simeth (MAALOX/MYLANTA) 200-200-20 MG/5ML suspension Take 30 mLs by mouth daily as needed for indigestion or heartburn (max 4 doses per 24 hours).   carboxymethylcellulose (REFRESH PLUS) 0.5 % SOLN Place 1 drop into both eyes 2 (two) times daily.    Cholecalciferol 25 MCG (1000 UT) tablet Take 1,000 Units by mouth daily.    levothyroxine (SYNTHROID) 125 MCG tablet Take 125 mcg by mouth daily before breakfast.    Menthol, Topical Analgesic, (BIOFREEZE) 4 % GEL Apply topically every 4 (four) hours as needed. To thighs   [DISCONTINUED] Baclofen 5 MG TABS Take 1 tablet by mouth at bedtime.   No facility-administered encounter medications on file as of 05/09/2021.    Thank you for the opportunity to participate in the care of Ms. Limbert.  The palliative care team will continue to follow. Please call our office at [phone] if we can be of additional assistance.   Jason Coop, NP   COVID-19 PATIENT SCREENING TOOL Asked and negative response unless otherwise noted:   Have you had symptoms of covid, tested positive or been in contact with someone with symptoms/positive test in the past 5-10 days?

## 2021-07-07 ENCOUNTER — Non-Acute Institutional Stay: Payer: Medicare Other | Admitting: Primary Care

## 2021-07-07 ENCOUNTER — Other Ambulatory Visit: Payer: Self-pay

## 2021-07-07 DIAGNOSIS — F0280 Dementia in other diseases classified elsewhere without behavioral disturbance: Secondary | ICD-10-CM

## 2021-07-07 DIAGNOSIS — G309 Alzheimer's disease, unspecified: Secondary | ICD-10-CM

## 2021-07-07 DIAGNOSIS — R4701 Aphasia: Secondary | ICD-10-CM

## 2021-07-07 DIAGNOSIS — Z515 Encounter for palliative care: Secondary | ICD-10-CM

## 2021-07-07 NOTE — Progress Notes (Signed)
[facility] Consult Note Telephone: [phone]  Fax: [phone]    Date of encounter: 07/07/21 2:10 PM PATIENT NAME: Sydney Turner [address] [place] [address]   [phone] (home)  DOB: [birth year] MRN: [id number] PRIMARY CARE PROVIDER:    Alvester Morin, MD,  [place]. [place] [address] [phone]  REFERRING PROVIDER:   Alvester Morin, MD [place]. [place],  [place] [address] [phone]  RESPONSIBLE PARTY:    Contact Information     Name Relation Home Work [place] Daughter   [phone]   Ghazarian,sherry Daughter   [phone]        [facility]. Palliative Care was asked to follow this patient by consultation request of  Slade-Hartman, Ivette Loyal* to address advance care planning and complex medical decision making. This is a follow up visit.                                   ASSESSMENT AND PLAN / RECOMMENDATIONS:   Advance Care Planning/Goals of Care: Goals include to maximize quality of life and symptom management.  CODE STATUS: DNR on record. T/c to daughter no answer. Message left  Symptom Management/Plan:  Follow up to assess for palliative symptom management and other concerns. Pt is up in chair, anticipating lunch. She is in high fowlers in her tilt chair. She is able to regard me but not able to speak due to her disease process.  She does not appear to be in discomfort. Staff endorse she  is comfortable, eating at her baseline, and not exhibiting new s/sx of decline.  Follow up Palliative Care Visit: Palliative care will continue to follow for complex medical decision making, advance care planning, and clarification of goals. Return 8 weeks or prn.  I spent 15 minutes providing this consultation. More than 50% of the time in this consultation was spent in counseling and care coordination.  PPS:  30%  HOSPICE ELIGIBILITY/DIAGNOSIS: TBD  Chief Complaint: aphasia, debility  HISTORY OF PRESENT ILLNESS:  Sydney Turner is a 80 y.o. year old female  with dementia, cva aphasia, debility .   History obtained from review of EMR, discussion with primary team, and interview with family, facility staff/caregiver and/or [name].  I reviewed available labs, medications, imaging, studies and related documents from the EMR.  Records reviewed and summarized above.   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 ([facility]) 07/23/2015   Osteoarthritis 05/15/2014   Hypothyroidism (acquired) 07/15/2012    ROS/staff   General: NAD ENMT: denies dysphagia Pulmonary: denies cough, denies increased SOB Abdomen: endorses good appetite, denies constipation, endorses incontinence of bowel GU: denies dysuria, endorses incontinence of urine MSK:  endorses  weakness,  no falls reported Skin: denies rashes or wounds Neurological: denies pain, denies insomnia Psych: Endorses flat mood Heme/lymph/immuno: denies bruises, abnormal bleeding  Physical Exam: Current and past weights:  stable at 128 lbs Constitutional: NAD General: frail appearing, thin ENMT: intact hearing, oral mucous membranes moist CV:  no LE edema Pulmonary: no increased work of breathing, no cough, room air Abdomen: intake 50%, no ascites GU: deferred MSK: + sarcopenia, contractures all extremities, non ambulatory Skin: warm and dry, no rashes or  wounds on visible skin Neuro:   + generalized weakness,  ++ cognitive impairment Psych: non-anxious affect, A and O x 1, non verbal Hem/lymph/immuno: no widespread bruising  Outpatient Encounter Medications as of 07/07/2021  Medication Sig   acetaminophen (TYLENOL) 500 MG tablet Take 1,000 mg by mouth 2 (two) times daily.   alum & mag hydroxide-simeth  (MAALOX/MYLANTA) 200-200-20 MG/5ML suspension Take 30 mLs by mouth daily as needed for indigestion or heartburn (max 4 doses per 24 hours).   carboxymethylcellulose (REFRESH PLUS) 0.5 % SOLN Place 1 drop into both eyes 2 (two) times daily.   Cholecalciferol 25 MCG (1000 UT) tablet Take 1,000 Units by mouth daily.    levothyroxine (SYNTHROID) 125 MCG tablet Take 125 mcg by mouth daily before breakfast.    [DISCONTINUED] Menthol, Topical Analgesic, (BIOFREEZE) 4 % GEL Apply topically every 4 (four) hours as needed. To thighs   No facility-administered encounter medications on file as of 07/07/2021.     Thank you for the opportunity to participate in the care of Sydney Turner.  The palliative care team will continue to follow. Please call our office at [phone] if we can be of additional assistance.   Jason Coop, NP   COVID-19 PATIENT SCREENING TOOL Asked and negative response unless otherwise noted:   Have you had symptoms of covid, tested positive or been in contact with someone with symptoms/positive test in the past 5-10 days?

## 2021-07-30 ENCOUNTER — Inpatient Hospital Stay
Admission: EM | Admit: 2021-07-30 | Discharge: 2021-08-19 | DRG: 056 | Disposition: E | Payer: Medicare Other | Source: Skilled Nursing Facility | Attending: Internal Medicine | Admitting: Internal Medicine

## 2021-07-30 ENCOUNTER — Emergency Department: Payer: Medicare Other

## 2021-07-30 ENCOUNTER — Other Ambulatory Visit: Payer: Self-pay

## 2021-07-30 DIAGNOSIS — G309 Alzheimer's disease, unspecified: Secondary | ICD-10-CM | POA: Diagnosis present

## 2021-07-30 DIAGNOSIS — G3101 Pick's disease: Secondary | ICD-10-CM | POA: Diagnosis present

## 2021-07-30 DIAGNOSIS — Z882 Allergy status to sulfonamides status: Secondary | ICD-10-CM | POA: Diagnosis not present

## 2021-07-30 DIAGNOSIS — E876 Hypokalemia: Secondary | ICD-10-CM | POA: Diagnosis not present

## 2021-07-30 DIAGNOSIS — M1990 Unspecified osteoarthritis, unspecified site: Secondary | ICD-10-CM | POA: Diagnosis present

## 2021-07-30 DIAGNOSIS — Z820 Family history of epilepsy and other diseases of the nervous system: Secondary | ICD-10-CM

## 2021-07-30 DIAGNOSIS — Z993 Dependence on wheelchair: Secondary | ICD-10-CM

## 2021-07-30 DIAGNOSIS — R509 Fever, unspecified: Secondary | ICD-10-CM

## 2021-07-30 DIAGNOSIS — E860 Dehydration: Secondary | ICD-10-CM | POA: Diagnosis present

## 2021-07-30 DIAGNOSIS — R1310 Dysphagia, unspecified: Secondary | ICD-10-CM | POA: Diagnosis present

## 2021-07-30 DIAGNOSIS — Z7401 Bed confinement status: Secondary | ICD-10-CM | POA: Diagnosis not present

## 2021-07-30 DIAGNOSIS — Z66 Do not resuscitate: Secondary | ICD-10-CM | POA: Diagnosis present

## 2021-07-30 DIAGNOSIS — R0902 Hypoxemia: Secondary | ICD-10-CM | POA: Diagnosis present

## 2021-07-30 DIAGNOSIS — Z79890 Hormone replacement therapy (postmenopausal): Secondary | ICD-10-CM | POA: Diagnosis not present

## 2021-07-30 DIAGNOSIS — Z96642 Presence of left artificial hip joint: Secondary | ICD-10-CM | POA: Diagnosis present

## 2021-07-30 DIAGNOSIS — N3000 Acute cystitis without hematuria: Secondary | ICD-10-CM

## 2021-07-30 DIAGNOSIS — R4182 Altered mental status, unspecified: Secondary | ICD-10-CM | POA: Diagnosis present

## 2021-07-30 DIAGNOSIS — F0280 Dementia in other diseases classified elsewhere without behavioral disturbance: Secondary | ICD-10-CM | POA: Diagnosis present

## 2021-07-30 DIAGNOSIS — N390 Urinary tract infection, site not specified: Secondary | ICD-10-CM | POA: Diagnosis present

## 2021-07-30 DIAGNOSIS — E039 Hypothyroidism, unspecified: Secondary | ICD-10-CM | POA: Diagnosis present

## 2021-07-30 DIAGNOSIS — Z808 Family history of malignant neoplasm of other organs or systems: Secondary | ICD-10-CM

## 2021-07-30 DIAGNOSIS — R638 Other symptoms and signs concerning food and fluid intake: Secondary | ICD-10-CM | POA: Diagnosis present

## 2021-07-30 DIAGNOSIS — D7589 Other specified diseases of blood and blood-forming organs: Secondary | ICD-10-CM | POA: Diagnosis present

## 2021-07-30 DIAGNOSIS — U071 COVID-19: Secondary | ICD-10-CM | POA: Diagnosis not present

## 2021-07-30 DIAGNOSIS — Z79899 Other long term (current) drug therapy: Secondary | ICD-10-CM

## 2021-07-30 DIAGNOSIS — Z880 Allergy status to penicillin: Secondary | ICD-10-CM

## 2021-07-30 DIAGNOSIS — G9341 Metabolic encephalopathy: Secondary | ICD-10-CM | POA: Diagnosis present

## 2021-07-30 DIAGNOSIS — E870 Hyperosmolality and hypernatremia: Secondary | ICD-10-CM | POA: Diagnosis present

## 2021-07-30 DIAGNOSIS — R7989 Other specified abnormal findings of blood chemistry: Secondary | ICD-10-CM

## 2021-07-30 DIAGNOSIS — M810 Age-related osteoporosis without current pathological fracture: Secondary | ICD-10-CM | POA: Diagnosis present

## 2021-07-30 DIAGNOSIS — Z515 Encounter for palliative care: Secondary | ICD-10-CM

## 2021-07-30 DIAGNOSIS — Z9109 Other allergy status, other than to drugs and biological substances: Secondary | ICD-10-CM

## 2021-07-30 DIAGNOSIS — R4701 Aphasia: Secondary | ICD-10-CM | POA: Diagnosis present

## 2021-07-30 LAB — AMMONIA: Ammonia: 23 umol/L (ref 9–35)

## 2021-07-30 LAB — URINALYSIS, COMPLETE (UACMP) WITH MICROSCOPIC
Bilirubin Urine: NEGATIVE
Glucose, UA: NEGATIVE mg/dL
Ketones, ur: NEGATIVE mg/dL
Nitrite: NEGATIVE
Protein, ur: 100 mg/dL — AB
Specific Gravity, Urine: 1.015 (ref 1.005–1.030)
Squamous Epithelial / HPF: NONE SEEN (ref 0–5)
WBC, UA: >50 WBC/hpf — ABNORMAL HIGH (ref 0–5)
pH: 8 (ref 5.0–8.0)

## 2021-07-30 LAB — COMPREHENSIVE METABOLIC PANEL
ALT: 40 U/L (ref 0–44)
AST: 45 U/L — ABNORMAL HIGH (ref 15–41)
Albumin: 3.6 g/dL (ref 3.5–5.0)
Alkaline Phosphatase: 58 U/L (ref 38–126)
Anion gap: 12 (ref 5–15)
BUN: 33 mg/dL — ABNORMAL HIGH (ref 8–23)
CO2: 27 mmol/L (ref 22–32)
Calcium: 10.3 mg/dL (ref 8.9–10.3)
Chloride: 118 mmol/L — ABNORMAL HIGH (ref 98–111)
Creatinine, Ser: 0.76 mg/dL (ref 0.44–1.00)
GFR, Estimated: >60 mL/min (ref >60)
Glucose, Bld: 123 mg/dL — ABNORMAL HIGH (ref 70–99)
Potassium: 3.5 mmol/L (ref 3.5–5.1)
Sodium: 157 mmol/L — ABNORMAL HIGH (ref 135–145)
Total Bilirubin: 1.2 mg/dL (ref 0.3–1.2)
Total Protein: 7.3 g/dL (ref 6.5–8.1)

## 2021-07-30 LAB — TSH: TSH: 0.02 u[IU]/mL — ABNORMAL LOW (ref 0.350–4.500)

## 2021-07-30 LAB — RESP PANEL BY RT-PCR (FLU A&B, COVID) ARPGX2
Influenza A by PCR: NEGATIVE
Influenza B by PCR: NEGATIVE
SARS Coronavirus 2 by RT PCR: NEGATIVE

## 2021-07-30 LAB — CBC
HCT: 40.5 % (ref 36.0–46.0)
Hemoglobin: 13.2 g/dL (ref 12.0–15.0)
MCH: 33.9 pg (ref 26.0–34.0)
MCHC: 32.6 g/dL (ref 30.0–36.0)
MCV: 104.1 fL — ABNORMAL HIGH (ref 80.0–100.0)
Platelets: 247 10*3/uL (ref 150–400)
RBC: 3.89 MIL/uL (ref 3.87–5.11)
RDW: 14.1 % (ref 11.5–15.5)
WBC: 6.4 10*3/uL (ref 4.0–10.5)
nRBC: 0 % (ref 0.0–0.2)

## 2021-07-30 LAB — T4, FREE: Free T4: 3.19 ng/dL — ABNORMAL HIGH (ref 0.61–1.12)

## 2021-07-30 LAB — D-DIMER, QUANTITATIVE: D-Dimer, Quant: 0.82 ug/mL-FEU — ABNORMAL HIGH (ref 0.00–0.50)

## 2021-07-30 LAB — LACTIC ACID, PLASMA
Lactic Acid, Venous: 1.4 mmol/L (ref 0.5–1.9)
Lactic Acid, Venous: 2 mmol/L (ref 0.5–1.9)

## 2021-07-30 MED ORDER — DEXTROSE 5 % IV SOLN: INTRAVENOUS | Status: AC

## 2021-07-30 MED ORDER — LACTATED RINGERS IV BOLUS
1000.0000 mL | Freq: Once | INTRAVENOUS | Status: AC
  Administered 2021-07-30: 1000 mL via INTRAVENOUS

## 2021-07-30 MED ORDER — ONDANSETRON HCL 4 MG/2ML IJ SOLN: 4.0000 mg | Freq: Four times a day (QID) | INTRAMUSCULAR | Status: DC | PRN

## 2021-07-30 MED ORDER — SODIUM CHLORIDE 0.9 % IV SOLN: 250.0000 mL | INTRAVENOUS | Status: DC | PRN

## 2021-07-30 MED ORDER — ACETAMINOPHEN 325 MG PO TABS: 650.0000 mg | ORAL_TABLET | ORAL | Status: DC | PRN

## 2021-07-30 MED ORDER — HEPARIN SODIUM (PORCINE) 5000 UNIT/ML IJ SOLN
5000.0000 [IU] | Freq: Three times a day (TID) | INTRAMUSCULAR | Status: DC
  Administered 2021-07-30 – 2021-07-31 (×2): 5000 [IU] via SUBCUTANEOUS
  Filled 2021-07-30 (×2): qty 1

## 2021-07-30 MED ORDER — DEXTROSE-NACL 5-0.45 % IV SOLN: INTRAVENOUS | Status: DC

## 2021-07-30 MED ORDER — SODIUM CHLORIDE 0.9 % IV SOLN
1.0000 g | Freq: Once | INTRAVENOUS | Status: AC
  Administered 2021-07-30: 1 g via INTRAVENOUS
  Filled 2021-07-30: qty 10

## 2021-07-30 MED ORDER — SODIUM CHLORIDE 0.9 % IV SOLN
1.0000 g | INTRAVENOUS | Status: DC
  Administered 2021-07-31: 20:00:00 1 g via INTRAVENOUS
  Filled 2021-07-30: qty 10
  Filled 2021-07-30: qty 1

## 2021-07-30 MED ORDER — SODIUM CHLORIDE 0.9% FLUSH
3.0000 mL | Freq: Two times a day (BID) | INTRAVENOUS | Status: DC
  Administered 2021-07-30: 23:00:00 3 mL via INTRAVENOUS

## 2021-07-30 MED ORDER — SODIUM CHLORIDE 0.9% FLUSH: 3.0000 mL | INTRAVENOUS | Status: DC | PRN

## 2021-07-30 NOTE — ED Provider Notes (Signed)
[facility] Emergency Department Provider Note  ____________________________________________   Event Date/Time   First MD Initiated Contact with Patient 07/25/2021 1610     (approximate)  I have reviewed the triage vital signs and the nursing notes.   HISTORY  Chief Complaint Altered Mental Status   HPI Sydney Turner is a 80 y.o. female with past medical history of Alzheimer's dementia and some aphasia as well as osteoporosis and hypothyroidism who presents accompanied by her daughter for assessment of dehydration and concerns that patient has not had anything to eat or drink for the last couple days and has not been speaking although normally she will be able to say some words typically at baseline.  Patient's daughter states she has actually been declining the last several weeks but over the last couple days has not had anything to eat or drink and has had very little urine output.  She states that blood work at her nursing facility showed some severe dehydration they were unable to get adequate IV access for adequate fluid resuscitation.  She states that the patient is typically wheelchair bedbound and does not follow commands.  Patient is DNR.  She states she is worried about a UTI and that the dehydration is causing her mother to be more current feels altered and usual.  No recent falls or injuries, fevers, cough or other clear acute sick symptoms.         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 Authorizing Provider  Cholecalciferol 25 MCG (1000 UT) tablet Take 1,000 Units by mouth daily.    Yes [provider]  levothyroxine (SYNTHROID) 125 MCG tablet Take 125 mcg by mouth daily before breakfast.    Yes [provider]  acetaminophen (TYLENOL) 500 MG tablet Take 1,000 mg by mouth 2 (two) times daily.    [provider]  alum & mag hydroxide-simeth (MAALOX/MYLANTA) 200-200-20 MG/5ML suspension Take 30 mLs by mouth daily as needed for indigestion or heartburn (max 4 doses per 24 hours).    [provider]  carboxymethylcellulose (REFRESH PLUS) 0.5 % SOLN Place 1 drop into both eyes 2 (two) times daily.    [provider]    Allergies Sulfa antibiotics, Amoxicillin, and Penicillins  History reviewed. No pertinent family history.  Social History Social History   Tobacco Use   Smoking status: Never   Smokeless tobacco: Never  Vaping Use   Vaping Use: Never used  Substance Use Topics   Alcohol use: No   Drug use: No    Review of Systems  Review of Systems  Unable to perform ROS: Mental status change     ____________________________________________  PHYSICAL EXAM:  VITAL SIGNS: ED Triage Vitals  Enc Vitals Group     BP 2021-08-06 1401 130/64     Pulse Rate 06-Aug-2021 1401 (!) 125     Resp 06-Aug-2021 1401 18     Temp 08/06/21 1401 98.6 F (37 C)     Temp Source 08-06-21 1401 Oral     SpO2 08-06-2021 1401 92 %     Weight 2021/08/06 1402 130 lb (59 kg)     Height 08-06-2021 1402 5' (1.524 m)     Head Circumference --      Peak Flow --      Pain Score --      Pain Loc --      Pain Edu? --      Excl. in GC?  --    Vitals:   06-Aug-2021 1401 08/06/2021 1730  BP: 130/64 126/67  Pulse: (!) 125 (!) 127  Resp: 18 15  Temp: 98.6 F (37 C)   SpO2: 92% 95%   Physical Exam Vitals and nursing note reviewed.  Constitutional:      General: She is in acute distress.     Appearance: She is well-developed. She is ill-appearing.  HENT:     Head: Normocephalic and atraumatic.     Right Ear: External ear normal.     Left Ear: External ear normal.     Nose: Nose normal.     Mouth/Throat:     Mouth: Mucous membranes are dry.  Eyes:     Conjunctiva/sclera: Conjunctivae normal.  Cardiovascular:     Rate and Rhythm: Regular rhythm. Tachycardia present.     Heart sounds: No murmur heard. Pulmonary:     Effort: Pulmonary effort is normal. No respiratory distress.     Breath sounds: Normal breath sounds.  Abdominal:     Palpations: Abdomen is soft.     Tenderness: There is no abdominal tenderness. There is no right CVA tenderness or left CVA tenderness.  Musculoskeletal:     Cervical back: Neck supple.     Right lower leg: No edema.     Left lower leg: No edema.  Skin:    General: Skin is warm and dry.     Capillary Refill: Capillary refill takes more than 3 seconds.  Neurological:     Mental Status: She is lethargic.     Cranial Nerves: No cranial nerve deficit.    Pupils are symmetric.  Patient does track this examiner appropriately.  She does not follow any commands.  She is not hypertonic.  No nystagmus. ____________________________________________   LABS (all labs ordered are listed, but only abnormal results are displayed)  Labs Reviewed  COMPREHENSIVE METABOLIC PANEL - Abnormal; Notable for the following components:      Result Value   Sodium 157 (*)    Chloride 118 (*)    Glucose, Bld 123 (*)    BUN 33 (*)    AST 45 (*)    All other components within normal limits  CBC - Abnormal; Notable for the following components:   MCV 104.1 (*)    All other components within normal limits   URINALYSIS, COMPLETE (UACMP) WITH MICROSCOPIC - Abnormal; Notable for the following components:   Color, Urine YELLOW (*)    APPearance TURBID (*)    Hgb urine dipstick SMALL (*)    Protein, ur 100 (*)    Leukocytes,Ua MODERATE (*)    WBC, UA >50 (*)    Bacteria, UA MANY (*)  All other components within normal limits  TSH - Abnormal; Notable for the following components:   TSH 0.020 (*)    All other components within normal limits  RESP PANEL BY RT-PCR (FLU A&B, COVID) ARPGX2  URINE CULTURE  CULTURE, BLOOD (SINGLE)  LACTIC ACID, PLASMA  AMMONIA  LACTIC ACID, PLASMA  T4, FREE  CBG MONITORING, ED   ____________________________________________  EKG  Sinus tachycardia with a ventricular rate of 131, normal axis, left anterior fascicle block, unremarkable intervals without other clear evidence of acute ischemia or significant arrhythmia. ____________________________________________  RADIOLOGY  ED MD interpretation:    Chest x-ray without evidence of pneumothorax, rib fracture, effusion, pleural consolidation, overt edema or other clear acute thoracic process.  CT head without evidence of intracranial hemorrhage, subacute CVA there is evidence of advanced chronic white matter microangiopathy and global parenchymal volume loss with severe edema noted in the hippocampal region.  There is a left mastoid effusion as well.  Official radiology report(s): CT HEAD WO CONTRAST ( )  Result Date: 07/28/2021 CLINICAL DATA:  Altered mental status EXAM: CT HEAD WITHOUT CONTRAST TECHNIQUE: Contiguous axial images were obtained from the base of the skull through the vertex without intravenous contrast. COMPARISON:  Head CT 11/30/2019 FINDINGS: Brain: There is no acute intracranial hemorrhage, extra-axial fluid collection, or acute infarct. There is moderate global parenchymal volume loss with enlargement of the ventricular system and extra-axial CSF spaces. There is severe hippocampal  atrophy, right worse than left. These findings are similar to the prior study Confluent hypodensity in the subcortical and periventricular white matter likely reflects sequela of advanced chronic white matter microangiopathy. There is no mass lesion.  There is no midline shift. Vascular: There is calcification of the bilateral cavernous ICAs. Skull: Normal. Negative for fracture or focal lesion. Sinuses/Orbits: The paranasal sinuses are clear. Bilateral lens implants are in place. The globes and orbits are otherwise unremarkable. Other: There is a left mastoid effusion. IMPRESSION: 1. No acute intracranial pathology. 2. Moderate global parenchymal volume loss with severe hippocampal atrophy, similar to the prior study. 3. Advanced chronic white matter microangiopathy, also not significantly changed. 4. Left mastoid effusion. Electronically Signed   By: Lesia Hausen M.D.   On: 07/20/2021 16:47   DG Chest Portable 1 View  Result Date: 08/14/2021 CLINICAL DATA:  Tachycardia, altered mental status EXAM: PORTABLE CHEST 1 VIEW COMPARISON:  Chest radiograph 02/02/2017 FINDINGS: Evaluation is degraded by suboptimal patient positioning. The lung apices are partially obscured. Within this confine: The cardiomediastinal silhouette is grossly within normal limits. Lung volumes are low. Linear opacities in the left lung base likely reflect subsegmental atelectasis. There is no focal consolidation. There is no significant pleural effusion. There is no definite pneumothorax No acute osseous abnormality is identified. IMPRESSION: Suboptimal study due to patient positioning. Within this confines, no radiographic evidence of acute cardiopulmonary process. Electronically Signed   By: Lesia Hausen M.D.   On: 08/17/2021 16:43    ____________________________________________   PROCEDURES  Procedure(s) performed (including Critical Care):  Procedures   ____________________________________________   INITIAL IMPRESSION /  ASSESSMENT AND PLAN / ED COURSE      Patient presents accompanied by daughter for assessment of some acute cute to subacute altered mental status in the setting of fairly advanced Alzheimer's dementia and very little p.o. intake over the last couple days as well as decreased urine output.  She is also been more sleepy than usual.  No history is available from patient on arrival as she is nonverbal in  the emergency room.  She is tachycardic at 125 with otherwise stable vital signs on room air with SPO2 of 92%.  Differential for patient's acute to subacute encephalopathy decreased p.o. intake dehydration include progression of her underlying dementia, SAH, CVA, other metabolic derangements, sepsis or acute infectious process and endocrine derangements as well as arrhythmia.  No recent reported falls or injuries and patient is typically wheelchair or bedbound at baseline and I have low suspicion that she could have fallen without someone noticing.  Low suspicion for toxic ingestion.  Sinus tachycardia with a ventricular rate of 131, normal axis, left anterior fascicle block, unremarkable intervals without other clear evidence of acute ischemia or significant arrhythmia.  Chest x-ray without evidence of pneumothorax, rib fracture, effusion, pleural consolidation, overt edema or other clear acute thoracic process.  CT head without evidence of intracranial hemorrhage, subacute CVA there is evidence of advanced chronic white matter microangiopathy and global parenchymal volume loss with severe edema noted in the hippocampal region.  There is a left mastoid effusion as well.  CMP remarkable for an NA of 157, chloride of 118, BUN 33 without other significant electrolyte or metabolic derangements.  No evidence of hepatitis or cholestasis.  CBC shows no leukocytosis or acute anemia.  UA has moderate leukocyte esterase, 100 protein, small hemoglobin greater 50 WBCs with many bacteria.  This consistent with acute  cystitis.  Patient is tachycardic suspect this likely represents dehydration as she has no fever, leukocytosis, hypotension or other signs to suggest sepsis at this time.  Lactic is not elevated.  Ammonia is within normal limits.  Somewhat unexpectedly given patient's history of hypothyroidism previously receiving Synthroid supplementation her TSH was quite low at 0.02.  We will follows up with a free T4.  Suspect some acute on chronic confusion and altered mental status as well as decreased p.o. intake, combination of UTI and severe dehydration.  We will start patient on Rocephin and IV fluids emergency room.  I will admit to hospital service for further evaluation and management.     ____________________________________________   FINAL CLINICAL IMPRESSION(S) / ED DIAGNOSES  Final diagnoses:  Dehydration  Altered mental status, unspecified altered mental status type  Hypernatremia  Low TSH level  Acute cystitis without hematuria    Medications  lactated ringers bolus 1,000 mL (has no administration in time range)  cefTRIAXone (ROCEPHIN) 1 g in sodium chloride 0.9 % 100 mL IVPB (has no administration in time range)  lactated ringers bolus 1,000 mL (1,000 mLs Intravenous New Bag/Given 07/24/2021 1746)     ED Discharge Orders     None        Note:  This document was prepared using Dragon voice recognition software and may include unintentional dictation errors.    Gilles Chiquito, MD 08/04/2021 1840

## 2021-07-30 NOTE — ED Triage Notes (Signed)
Pt comes into the ED via [facility] from [facility] c/o AMS.  Pt has dementia at baseline.  Pt normally speaks, but today she is not speaking and she has had no oral intake for the past couple days.  Pt running at sinus tach at 140 with no elevation.   159 CBG 96% RA 16 RR 148/70's 20g R hand DNR present

## 2021-07-30 NOTE — H&P (Signed)
History and Physical    ROSALIE BUENAVENTURA Turner:[id number] DOB: 07/20/1941 DOA: 08-25-2021  PCP: Keane Police, MD    Patient coming from:  SNF   Chief Complaint:  AMS   HPI: Sydney Turner is a 80 y.o. female seen in ed with complaints of mental status.  Patient is a resident of a skilled nursing facility where blood work was obtained on was concerning for dehydration.  Per report family members concerned that patient may have a urinary tract infection and has had decreased p.o. intake and urine output for the past few days and has been declining over past few weeks.  Patient at baseline is nonverbal bedridden and does not follow commands.  CODE STATUS is DO NOT RESUSCITATE. HPI /ROS is otherwise unobtainable due to dementia and age. Per daughter mom has advanced dementia and answers yes and no questions. Limited mobility.  Pt has past medical history of Alzheimer's dementia, osteoarthritis, hypothyroidism, UTI. Patient also has an allergy to sulfa and amoxicillin, penicillin.  ED Course:  Vitals:   Aug 25, 2021 1900 August 25, 2021 2100 Aug 25, 2021 2115 08/25/21 2145  BP: (!) 153/61 (!) 152/80    Pulse: (!) 118  95   Resp: 18 18  16   Temp:  (!) 97.5 F (36.4 C)    TempSrc:  Oral    SpO2: 98% 95%    Weight:      Height:      In ed pt is hypernatremic, and started on LR.  Initial EKG shows sinus tachycardia at 131 with normal axis and normal intervals. Chest x-ray shows no evidence of pneumothorax rib fracture effusion consolidation or any acute findings.  Patient also had CT head noncontrast because of her altered mental status and decreased interaction with patient was negative for any intracranial hemorrhage, there is advanced chronic white matter microangiopathy.  U/A looks concerning for infection.  Pt is hyperthyroid. Pt has not been taking her thyroid meds. Blood work today shows a sodium of 157 chloride 118 glucose 123 AST of 45 MCV of 104, Urinalysis is turbid with small  hemoglobin of more than 50 WBCs and many bacteria. TSH is 0.020, free T4 is 3.19, ammonia is 23, lactic acid is 1.4.  Review of Systems:  Review of Systems  Unable to perform ROS: Dementia    Past Medical History:  Diagnosis Date   Alzheimer's dementia (HCC)    With Primary Progressive Aphasia   Aphasia    Dementia (HCC)    Dementia with behavioral disturbance    Osteoporosis     Past Surgical History:  Procedure Laterality Date   CATARACT EXTRACTION     COLONOSCOPY     HIP ARTHROPLASTY Left 05/09/2019   Procedure: ARTHROPLASTY BIPOLAR HIP (HEMIARTHROPLASTY);  Surgeon: 05/11/2019, MD;  Location: [facility] ORS;  Service: Orthopedics;  Laterality: Left;     reports that she has never smoked. She has never used smokeless tobacco. She reports that she does not drink alcohol and does not use drugs.  Allergies  Allergen Reactions   Sulfa Antibiotics Swelling    Edema   Amoxicillin     Has patient had a PCN reaction causing immediate rash, facial/tongue/throat swelling, SOB or lightheadedness with hypotension: Unknown Has patient had a PCN reaction causing severe rash involving mucus membranes or skin necrosis: Unknown Has patient had a PCN reaction that required hospitalization: Unknown Has patient had a PCN reaction occurring within the last 10 years: Unknown If all of the above answers are "NO", then may proceed with  Cephalosporin use.    Penicillins Nausea And Vomiting    Has patient had a PCN reaction causing immediate rash, facial/tongue/throat swelling, SOB or lightheadedness with hypotension: Unknown Has patient had a PCN reaction causing severe rash involving mucus membranes or skin necrosis: Unknown Has patient had a PCN reaction that required hospitalization: Unknown Has patient had a PCN reaction occurring within the last 10 years: Unknown If all of the above answers are "NO", then may proceed with Cephalosporin use.     Family History  Problem Relation Age of  Onset   Alzheimer's disease Mother    Melanoma Father     Prior to Admission medications   Medication Sig Start Date End Date Taking? Authorizing Provider  Cholecalciferol 25 MCG (1000 UT) tablet Take 1,000 Units by mouth daily.    Yes [provider]  levothyroxine (SYNTHROID) 125 MCG tablet Take 125 mcg by mouth daily before breakfast.    Yes [provider]  acetaminophen (TYLENOL) 500 MG tablet Take 1,000 mg by mouth 2 (two) times daily.    [provider]  alum & mag hydroxide-simeth (MAALOX/MYLANTA) 200-200-20 MG/5ML suspension Take 30 mLs by mouth daily as needed for indigestion or heartburn (max 4 doses per 24 hours).    [provider]  carboxymethylcellulose (REFRESH PLUS) 0.5 % SOLN Place 1 drop into both eyes 2 (two) times daily.    [provider]    Physical Exam: Vitals:   07/23/2021 1900 08/09/2021 2100 07/23/2021 2115 08/04/2021 2145  BP: (!) 153/61 (!) 152/80    Pulse: (!) 118  95   Resp: 18 18  16   Temp:  (!) 97.5 F (36.4 C)    TempSrc:  Oral    SpO2: 98% 95%    Weight:      Height:       Physical Exam Vitals and nursing note reviewed.  Constitutional:      General: She is not in acute distress.    Appearance: She is ill-appearing. She is not toxic-appearing or diaphoretic.  HENT:     Head: Normocephalic and atraumatic.     Right Ear: External ear normal.     Left Ear: External ear normal.     Nose: Nose normal.     Mouth/Throat:     Mouth: Mucous membranes are dry.  Eyes:     Extraocular Movements: Extraocular movements intact.     Pupils: Pupils are equal, round, and reactive to light.  Cardiovascular:     Rate and Rhythm: Normal rate and regular rhythm.     Pulses: Normal pulses.     Heart sounds: Normal heart sounds.  Pulmonary:     Effort: Pulmonary effort is normal.     Breath sounds: Normal breath sounds.  Abdominal:     General: Bowel sounds are normal. There is no distension.     Palpations: Abdomen  is soft. There is no mass.     Tenderness: There is no abdominal tenderness. There is no guarding.     Hernia: No hernia is present.  Musculoskeletal:     Right lower leg: No edema.     Left lower leg: No edema.  Skin:    General: Skin is warm.  Neurological:     Comments: Unable to assess pt is nonverbal and lethargic.    Labs on Admission: I have personally reviewed following labs and imaging studies  No results for input(s): CKTOTAL, CKMB, TROPONINI in the last 72 hours. Lab Results  Component Value  Date   WBC 6.4 07/26/2021   HGB 13.2 08/05/2021   HCT 40.5 07/24/2021   MCV 104.1 (H) 07/25/2021   PLT 247 07/22/2021    Recent Labs  Lab 08/07/2021 1425  NA 157*  K 3.5  CL 118*  CO2 27  BUN 33*  CREATININE 0.76  CALCIUM 10.3  PROT 7.3  BILITOT 1.2  ALKPHOS 58  ALT 40  AST 45*  GLUCOSE 123*   No results found for: CHOL, HDL, LDLCALC, TRIG No results found for: DDIMER Invalid input(s): POCBNP  Urinalysis    Component Value Date/Time   COLORURINE YELLOW (A) 08/02/2021 1530   APPEARANCEUR TURBID (A) 08/11/2021 1530   APPEARANCEUR CLEAR 03/23/2014 1953   LABSPEC 1.015 07/26/2021 1530   LABSPEC 1.020 03/23/2014 1953   PHURINE 8.0 08/10/2021 1530   GLUCOSEU NEGATIVE 07/28/2021 1530   GLUCOSEU NEGATIVE 03/23/2014 1953   HGBUR SMALL (A) 07/28/2021 1530   BILIRUBINUR NEGATIVE 07/20/2021 1530   BILIRUBINUR NEGATIVE 03/23/2014 1953   KETONESUR NEGATIVE 08/13/2021 1530   PROTEINUR 100 (A) 08/10/2021 1530   NITRITE NEGATIVE 08/02/2021 1530   LEUKOCYTESUR MODERATE (A) 08/15/2021 1530   LEUKOCYTESUR TRACE 03/23/2014 1953    COVID-19 Labs  No results for input(s): DDIMER, FERRITIN, LDH, CRP in the last 72 hours.  Lab Results  Component Value Date   SARSCOV2NAA NEGATIVE 08/11/2021   SARSCOV2NAA NEGATIVE 05/15/2019   SARSCOV2NAA NEGATIVE 05/08/2019    Radiological Exams on Admission: CT HEAD WO CONTRAST ( )  Result Date: 08/13/2021 CLINICAL DATA:  Altered  mental status EXAM: CT HEAD WITHOUT CONTRAST TECHNIQUE: Contiguous axial images were obtained from the base of the skull through the vertex without intravenous contrast. COMPARISON:  Head CT 11/30/2019 FINDINGS: Brain: There is no acute intracranial hemorrhage, extra-axial fluid collection, or acute infarct. There is moderate global parenchymal volume loss with enlargement of the ventricular system and extra-axial CSF spaces. There is severe hippocampal atrophy, right worse than left. These findings are similar to the prior study Confluent hypodensity in the subcortical and periventricular white matter likely reflects sequela of advanced chronic white matter microangiopathy. There is no mass lesion.  There is no midline shift. Vascular: There is calcification of the bilateral cavernous ICAs. Skull: Normal. Negative for fracture or focal lesion. Sinuses/Orbits: The paranasal sinuses are clear. Bilateral lens implants are in place. The globes and orbits are otherwise unremarkable. Other: There is a left mastoid effusion. IMPRESSION: 1. No acute intracranial pathology. 2. Moderate global parenchymal volume loss with severe hippocampal atrophy, similar to the prior study. 3. Advanced chronic white matter microangiopathy, also not significantly changed. 4. Left mastoid effusion. Electronically Signed   By: Lesia Hausen M.D.   On: 07/26/2021 16:47   DG Chest Portable 1 View  Result Date: 08/04/2021 CLINICAL DATA:  Tachycardia, altered mental status EXAM: PORTABLE CHEST 1 VIEW COMPARISON:  Chest radiograph 02/02/2017 FINDINGS: Evaluation is degraded by suboptimal patient positioning. The lung apices are partially obscured. Within this confine: The cardiomediastinal silhouette is grossly within normal limits. Lung volumes are low. Linear opacities in the left lung base likely reflect subsegmental atelectasis. There is no focal consolidation. There is no significant pleural effusion. There is no definite pneumothorax No  acute osseous abnormality is identified. IMPRESSION: Suboptimal study due to patient positioning. Within this confines, no radiographic evidence of acute cardiopulmonary process. Electronically Signed   By: Lesia Hausen M.D.   On: 07/29/2021 16:43    EKG: Independently reviewed.  Initial EKG shows sinus tachycardia at 131 with  normal axis and normal intervals.     Assessment/Plan Principal Problem:   AMS (altered mental status) Active Problems:   UTI (urinary tract infection)   Hypothyroidism (acquired)   Hypernatremia   AMS/ hypernatremia: Is due to multifactorial etiology bet ween metabolic , UTI. We will correct electrolytes and MIVF. We will treat underlying issue with av abx ivf.  Initial head ct neg for any acute finding and we will consider MRI / EEG. Neurology consult.  If MS does not improve in the next  24 hours.  Seizure/ fall and aspiration precaution.  UTI:  Cont with rocephin .   Hypothyroidism: I dont see levothyroxine in chart and levels show pt is hyperthyroidism.  Her TFT shows elevated FT4 and will need to be followed if this is autoimmune thyroiditis.  TSH 0.020 FT$ of 3.19.     DVT prophylaxis:  SCDs  Code Status:  Full code  Family Communication:  Gary Fleet (Daughter)  [phone] (Mobile)   Disposition Plan:  Skilled nursing facility  Consults called:  None  Admission status: Inpatient.     Gertha Calkin MD [facility] [phone] How to contact the [facility] Attending or Consulting provider 7A - 7P or covering provider during after hours 7P -7A, for this patient.    Check the care team in [facility] and look for a) attending/consulting [facility] provider listed and b) the [facility] team listed Log into [email or web address] and use 's universal password to access. If you do not have the password, please contact the hospital operator. Locate the [facility] provider you are looking for under [facility] and page to a number that you can be  directly reached. If you still have difficulty reaching the provider, please page the [facility] (Director on Call) for the Hospitalists listed on amion for assistance. [email or web address] Password [facility] 05-Aug-2021, 10:37 PM

## 2021-07-30 NOTE — ED Notes (Signed)
Per daughter, pt states that the facility did bloodwork and she was dehydrated and pt had a bag of fluids but IV blew.

## 2021-07-30 NOTE — ED Notes (Signed)
Pt family member refused blood stick for repeat lactic acid and blood cultures at this time.

## 2021-07-31 LAB — BASIC METABOLIC PANEL
Anion gap: 10 (ref 5–15)
BUN: 22 mg/dL (ref 8–23)
CO2: 29 mmol/L (ref 22–32)
Calcium: 9.4 mg/dL (ref 8.9–10.3)
Chloride: 116 mmol/L — ABNORMAL HIGH (ref 98–111)
Creatinine, Ser: 0.63 mg/dL (ref 0.44–1.00)
GFR, Estimated: >60 mL/min (ref >60)
Glucose, Bld: 106 mg/dL — ABNORMAL HIGH (ref 70–99)
Potassium: 3.1 mmol/L — ABNORMAL LOW (ref 3.5–5.1)
Sodium: 155 mmol/L — ABNORMAL HIGH (ref 135–145)

## 2021-07-31 LAB — MAGNESIUM: Magnesium: 1.9 mg/dL (ref 1.7–2.4)

## 2021-07-31 LAB — LACTIC ACID, PLASMA: Lactic Acid, Venous: 1.3 mmol/L (ref 0.5–1.9)

## 2021-07-31 LAB — VITAMIN B12: Vitamin B-12: 481 pg/mL (ref 180–914)

## 2021-07-31 MED ORDER — POTASSIUM CL IN DEXTROSE 5% 20 MEQ/L IV SOLN
20.0000 meq | INTRAVENOUS | Status: DC
  Administered 2021-07-31: 17:00:00 20 meq via INTRAVENOUS
  Filled 2021-07-31 (×3): qty 1000

## 2021-07-31 MED ORDER — ENOXAPARIN SODIUM 40 MG/0.4ML IJ SOSY
40.0000 mg | PREFILLED_SYRINGE | INTRAMUSCULAR | Status: DC
  Administered 2021-08-01 – 2021-08-02 (×2): 40 mg via SUBCUTANEOUS
  Filled 2021-07-31 (×2): qty 0.4

## 2021-07-31 MED ORDER — GLYCERIN (LAXATIVE) 2 G RE SUPP
1.0000 | Freq: Every day | RECTAL | Status: DC | PRN
  Filled 2021-07-31: qty 1

## 2021-07-31 MED ORDER — DEXTROSE 5 % IV SOLN: INTRAVENOUS | Status: DC

## 2021-07-31 MED ORDER — DEXTROSE-NACL 5-0.45 % IV SOLN: INTRAVENOUS | Status: DC

## 2021-07-31 NOTE — Progress Notes (Signed)
PROGRESS NOTE    [name]  SKA:[id number] DOB: Mar 03, 1941 DOA: 08-09-2021 PCP: Keane Police, MD  Outpatient Specialists: none    Brief Narrative:   From admission hpi:  Sydney Turner is a 80 y.o. female seen in ed with complaints of mental status.  Patient is a resident of a skilled nursing facility where blood work was obtained on was concerning for dehydration.  Per report family members concerned that patient may have a urinary tract infection and has had decreased p.o. intake and urine output for the past few days and has been declining over past few weeks.  Patient at baseline is nonverbal bedridden and does not follow commands.  CODE STATUS is DO NOT RESUSCITATE. HPI /ROS is otherwise unobtainable due to dementia and age. Per daughter mom has advanced dementia and answers yes and no questions. Limited mobility.   Pt has past medical history of Alzheimer's dementia, osteoarthritis, hypothyroidism, UTI. Patient also has an allergy to sulfa and amoxicillin, penicillin.   Assessment & Plan:   Principal Problem:   AMS (altered mental status) Active Problems:   Hypothyroidism (acquired)   UTI (urinary tract infection)   Hypernatremia  # Dehydration # Acute metabolic encephalopathy # Hypernatremia, chronic # Dysphagia Daughter concerned not receiving adequate care at facility, not being offered food/drink. Declining advanced dementia may be ultimate cause. Followed by palliative as outpatient. Sodium 157 on admission - d5 water @ 75, goal reduce sodium by 10 in 24 hours - repeat labs pending - slp eval  # Pyuria Unclear whether this is symptomatic uti; family very desirous of treatment - f/u culture - cont ceftriaxone for now  # Macrocytosis - f/u b12  # Hypothyroidism TSH low, free T4 elevated. On levothyroxine 125 mcg as outpt - hold levothyroxine, repeat TFTs in 4-6 weeks    DVT prophylaxis: lovenox Code Status: dnr Family Communication:  daughter updated @ bedside 10/13  Level of care: Med-Surg Status is: Inpatient  Remains inpatient appropriate because:Inpatient level of care appropriate due to severity of illness  Dispo: The patient is from: SNF              Anticipated d/c is to: SNF              Patient currently is not medically stable to d/c.   Difficult to place patient No        Consultants:  none  Procedures: none  Antimicrobials:  Ceftriaxone 10/12>    Subjective: This morning in bed, not responsive, NAD  Objective: Vitals:   08-09-21 2145 August 09, 2021 2339 07/31/21 0600 07/31/21 0753  BP:  (!) 147/78 (!) 144/72 (!) 156/76  Pulse:  96 88 (!) 103  Resp: 16 16 18 16   Temp:  98.4 F (36.9 C) 98.6 F (37 C) 98.9 F (37.2 C)  TempSrc:  Axillary Axillary   SpO2:  98% 98% 100%  Weight:      Height:        Intake/Output Summary (Last 24 hours) at 07/31/2021 0831 Last data filed at 07/31/2021 0600 Gross per 24 hour  Intake 1281.3 ml  Output 401 ml  Net 880.3 ml   Filed Weights   09-Aug-2021 1402  Weight: 59 kg    Examination:  General exam: Appears calm and comfortable. Chronically ill appearing  Respiratory system: Clear to auscultation. Respiratory effort normal. Cardiovascular system: S1 & S2 heard, RRR. No JVD, murmurs, rubs, gallops or clicks. No pedal edema. Gastrointestinal system: Abdomen is nondistended, soft and nontender. No  organomegaly or masses felt. Normal bowel sounds heard. Central nervous system: awake. Not communicative Extremities: moves all 4 Skin: No rashes, lesions or ulcers. Sacrum examined Psychiatry: non-verbal, doesn't follow commands.    Data Reviewed: I have personally reviewed following labs and imaging studies  CBC: Recent Labs  Lab 07/29/2021 1425  WBC 6.4  HGB 13.2  HCT 40.5  MCV 104.1*  PLT 247   Basic Metabolic Panel: Recent Labs  Lab 08/18/2021 1425  NA 157*  K 3.5  CL 118*  CO2 27  GLUCOSE 123*  BUN 33*  CREATININE 0.76  CALCIUM  10.3   GFR: Estimated Creatinine Clearance: 45.8 mL/min (by C-G formula based on SCr of 0.76 mg/dL). Liver Function Tests: Recent Labs  Lab 08/18/2021 1425  AST 45*  ALT 40  ALKPHOS 58  BILITOT 1.2  PROT 7.3  ALBUMIN 3.6   No results for input(s): LIPASE, AMYLASE in the last 168 hours. Recent Labs  Lab 08/11/2021 1614  AMMONIA 23   Coagulation Profile: No results for input(s): INR, PROTIME in the last 168 hours. Cardiac Enzymes: No results for input(s): CKTOTAL, CKMB, CKMBINDEX, TROPONINI in the last 168 hours. BNP (last 3 results) No results for input(s): PROBNP in the last 8760 hours. HbA1C: No results for input(s): HGBA1C in the last 72 hours. CBG: No results for input(s): GLUCAP in the last 168 hours. Lipid Profile: No results for input(s): CHOL, HDL, LDLCALC, TRIG, CHOLHDL, LDLDIRECT in the last 72 hours. Thyroid Function Tests: Recent Labs    07/23/2021 1623 07/20/2021 1713  TSH 0.020*  --   FREET4  --  3.19*   Anemia Panel: No results for input(s): VITAMINB12, FOLATE, FERRITIN, TIBC, IRON, RETICCTPCT in the last 72 hours. Urine analysis:    Component Value Date/Time   COLORURINE YELLOW (A) 07/22/2021 1530   APPEARANCEUR TURBID (A) 07/27/2021 1530   APPEARANCEUR CLEAR 03/23/2014 1953   LABSPEC 1.015 07/24/2021 1530   LABSPEC 1.020 03/23/2014 1953   PHURINE 8.0 07/19/2021 1530   GLUCOSEU NEGATIVE 07/28/2021 1530   GLUCOSEU NEGATIVE 03/23/2014 1953   HGBUR SMALL (A) 08/06/2021 1530   BILIRUBINUR NEGATIVE 08/11/2021 1530   BILIRUBINUR NEGATIVE 03/23/2014 1953   KETONESUR NEGATIVE 08/10/2021 1530   PROTEINUR 100 (A) 08/18/2021 1530   NITRITE NEGATIVE 08/10/2021 1530   LEUKOCYTESUR MODERATE (A) 08/17/2021 1530   LEUKOCYTESUR TRACE 03/23/2014 1953   Sepsis Labs: @LABRCNTIP (procalcitonin:4,lacticidven:4)  ) Recent Results (from the past 240 hour(s))  Resp Panel by RT-PCR (Flu A&B, Covid) Nasopharyngeal Swab     Status: None   Collection Time: 08/04/2021  4:12  PM   Specimen: Nasopharyngeal Swab; Nasopharyngeal(NP) swabs in vial transport medium  Result Value Ref Range Status   SARS Coronavirus 2 by RT PCR NEGATIVE NEGATIVE Final    Comment: (NOTE) SARS-CoV-2 target nucleic acids are NOT DETECTED.  The SARS-CoV-2 RNA is generally detectable in upper respiratory specimens during the acute phase of infection. The lowest concentration of SARS-CoV-2 viral copies this assay can detect is 138 copies/mL. A negative result does not preclude SARS-Cov-2 infection and should not be used as the sole basis for treatment or other patient management decisions. A negative result may occur with  improper specimen collection/handling, submission of specimen other than nasopharyngeal swab, presence of viral mutation(s) within the areas targeted by this assay, and inadequate number of viral copies(<138 copies/mL). A negative result must be combined with clinical observations, patient history, and epidemiological information. The expected result is Negative.  Fact Sheet for Patients:  09/29/21  Fact Sheet for Healthcare Providers:  SeriousBroker.it  This test is no t yet approved or cleared by the [place] FDA and  has been authorized for detection and/or diagnosis of SARS-CoV-2 by FDA under an Emergency Use Authorization (EUA). This EUA will remain  in effect (meaning this test can be used) for the duration of the COVID-19 declaration under Section 564(b)(1) of the Act, 21 U.S.C.section 360bbb-3(b)(1), unless the authorization is terminated  or revoked sooner.       Influenza A by PCR NEGATIVE NEGATIVE Final   Influenza B by PCR NEGATIVE NEGATIVE Final    Comment: (NOTE) The Xpert Xpress SARS-CoV-2/FLU/RSV plus assay is intended as an aid in the diagnosis of influenza from Nasopharyngeal swab specimens and should not be used as a sole basis for treatment. Nasal washings and aspirates are  unacceptable for Xpert Xpress SARS-CoV-2/FLU/RSV testing.  Fact Sheet for Patients: BloggerCourse.com  Fact Sheet for Healthcare Providers: SeriousBroker.it  This test is not yet approved or cleared by the [place] FDA and has been authorized for detection and/or diagnosis of SARS-CoV-2 by FDA under an Emergency Use Authorization (EUA). This EUA will remain in effect (meaning this test can be used) for the duration of the COVID-19 declaration under Section 564(b)(1) of the Act, 21 U.S.C. section 360bbb-3(b)(1), unless the authorization is terminated or revoked.  Performed at [facility], [address] [place], [place] [address]   Blood culture (single)     Status: None (Preliminary result)   Collection Time: 07/20/2021 11:01 PM   Specimen: BLOOD  Result Value Ref Range Status   Specimen Description BLOOD LEFT HAND  Final   Special Requests   Final    BOTTLES DRAWN AEROBIC ONLY Blood Culture results may not be optimal due to an inadequate volume of blood received in culture bottles   Culture   Final    NO GROWTH < 12 HOURS Performed at [facility], [address] [place], [place] [address]    Report Status PENDING  Incomplete         Radiology Studies: CT HEAD WO CONTRAST ( )  Result Date: 08/07/2021 CLINICAL DATA:  Altered mental status EXAM: CT HEAD WITHOUT CONTRAST TECHNIQUE: Contiguous axial images were obtained from the base of the skull through the vertex without intravenous contrast. COMPARISON:  Head CT 11/30/2019 FINDINGS: Brain: There is no acute intracranial hemorrhage, extra-axial fluid collection, or acute infarct. There is moderate global parenchymal volume loss with enlargement of the ventricular system and extra-axial CSF spaces. There is severe hippocampal atrophy, right worse than left. These findings are similar to the prior study Confluent hypodensity in the subcortical and  periventricular white matter likely reflects sequela of advanced chronic white matter microangiopathy. There is no mass lesion.  There is no midline shift. Vascular: There is calcification of the bilateral cavernous ICAs. Skull: Normal. Negative for fracture or focal lesion. Sinuses/Orbits: The paranasal sinuses are clear. Bilateral lens implants are in place. The globes and orbits are otherwise unremarkable. Other: There is a left mastoid effusion. IMPRESSION: 1. No acute intracranial pathology. 2. Moderate global parenchymal volume loss with severe hippocampal atrophy, similar to the prior study. 3. Advanced chronic white matter microangiopathy, also not significantly changed. 4. Left mastoid effusion. Electronically Signed   By: Lesia Hausen M.D.   On: 07/29/2021 16:47   DG Chest Portable 1 View  Result Date: 08/04/2021 CLINICAL DATA:  Tachycardia, altered mental status EXAM: PORTABLE CHEST 1 VIEW COMPARISON:  Chest radiograph 02/02/2017 FINDINGS:  Evaluation is degraded by suboptimal patient positioning. The lung apices are partially obscured. Within this confine: The cardiomediastinal silhouette is grossly within normal limits. Lung volumes are low. Linear opacities in the left lung base likely reflect subsegmental atelectasis. There is no focal consolidation. There is no significant pleural effusion. There is no definite pneumothorax No acute osseous abnormality is identified. IMPRESSION: Suboptimal study due to patient positioning. Within this confines, no radiographic evidence of acute cardiopulmonary process. Electronically Signed   By: Lesia Hausen M.D.   On: 08/03/2021 16:43        Scheduled Meds:  heparin  5,000 Units Subcutaneous Q8H   sodium chloride flush  3 mL Intravenous Q12H   Continuous Infusions:  sodium chloride     cefTRIAXone (ROCEPHIN)  IV       LOS: 1 day    Time spent: 40 min    Silvano Bilis, MD [facility]   If 7PM-7AM, please contact  night-coverage [email or web address] Password [email or web address] 07/31/2021, 8:31 AM

## 2021-07-31 NOTE — Evaluation (Signed)
Clinical/Bedside Swallow Evaluation Patient Details  Name: Sydney Turner MRN: [id number] Date of Birth: 12/29/1940  Today's Date: 07/31/2021 Time: SLP Start Time (ACUTE ONLY): 1110 SLP Stop Time (ACUTE ONLY): 1150 SLP Time Calculation (min) (ACUTE ONLY): 40 min  Past Medical History:  Past Medical History:  Diagnosis Date   Alzheimer's dementia (HCC)    With Primary Progressive Aphasia   Aphasia    Dementia (HCC)    Dementia with behavioral disturbance    Osteoporosis    Past Surgical History:  Past Surgical History:  Procedure Laterality Date   CATARACT EXTRACTION     COLONOSCOPY     HIP ARTHROPLASTY Left 05/09/2019   Procedure: ARTHROPLASTY BIPOLAR HIP (HEMIARTHROPLASTY);  Surgeon: Juanell Fairly, MD;  Location: [facility] ORS;  Service: Orthopedics;  Laterality: Left;   HPI:  Per admitting H&P " Sydney Turner is a 80 y.o. female seen in ed with complaints of mental status.  Patient is a resident of a skilled nursing facility where blood work was obtained on was concerning for dehydration.  Per report family members concerned that patient may have a urinary tract infection and has had decreased p.o. intake and urine output for the past few days and has been declining over past few weeks.  Patient at baseline is nonverbal bedridden and does not follow commands.  CODE STATUS is DO NOT RESUSCITATE. HPI /ROS is otherwise unobtainable due to dementia and age. Per daughter mom has advanced dementia and answers yes and no questions. Limited mobility.     Pt has past medical history of Alzheimer's dementia, osteoarthritis, hypothyroidism, UTI.  Patient also has an allergy to sulfa and amoxicillin, penicillin.   "    Assessment / Plan / Recommendation  Clinical Impression  Pt presents with moderate to severe oral dysphagia seondary to altered mental status and decreased alertness. Pt's daughter was in the room and very supportive and provided detailed history of the past few weeks. Prior to  recent decline, Pt was taking in sold foods and regular drinks. A recent decline in overall mental status and alertness warrented the need for pureed foods and thickened liquids with ST providing dysphagia treatment and staff education at the facitlty in whcih she resides. Today, Pt needed oral care and physical stimulation to awaken enough for assessment. She tolerated 2 ice chips with noted oral transit delay and anterior leakage but no s/s of aspiration. She was also able to take 3 small bites of applesauce given extended time to propell the bolus along with max physical and oral cues. Rec NPO except ice chips after oral care at this time. Will continue to reassess at bedside and initiate an oral diet as deemed safe. SLP Visit Diagnosis: Dysphagia, oropharyngeal phase (R13.12)    Aspiration Risk  Moderate aspiration risk;Severe aspiration risk    Diet Recommendation NPO;Ice chips PRN after oral care        Other  Recommendations Oral Care Recommendations: Oral care prior to ice chip/H20;Oral care BID    Recommendations for follow up therapy are one component of a multi-disciplinary discharge planning process, led by the attending physician.  Recommendations may be updated based on patient status, additional functional criteria and insurance authorization.  Follow up Recommendations Skilled Nursing facility      Frequency and Duration  (reassess as approriate)          Prognosis Prognosis for Safe Diet Advancement: Guarded Barriers to Reach Goals: Cognitive deficits;Severity of deficits Barriers/Prognosis Comment: Dementia at baseline with new altered  mental status change      Swallow Study   General Date of Onset: 2021/08/16 HPI: Per admitting H&P " Sydney Turner is a 80 y.o. female seen in ed with complaints of mental status.  Patient is a resident of a skilled nursing facility where blood work was obtained on was concerning for dehydration.  Per report family members concerned  that patient may have a urinary tract infection and has had decreased p.o. intake and urine output for the past few days and has been declining over past few weeks.  Patient at baseline is nonverbal bedridden and does not follow commands.  CODE STATUS is DO NOT RESUSCITATE. HPI /ROS is otherwise unobtainable due to dementia and age. Per daughter mom has advanced dementia and answers yes and no questions. Limited mobility.     Pt has past medical history of Alzheimer's dementia, osteoarthritis, hypothyroidism, UTI.  Patient also has an allergy to sulfa and amoxicillin, penicillin.   " Type of Study: Bedside Swallow Evaluation Previous Swallow Assessment: Pt has been followed by SLP at nursing facility Diet Prior to this Study: NPO Respiratory Status: Room air History of Recent Intubation: No Behavior/Cognition: Cooperative;Lethargic/Drowsy;Requires cueing Oral Cavity Assessment: Dried secretions Oral Care Completed by SLP: Yes Oral Cavity - Dentition: Missing dentition;Other (Comment) (Natural dentition adequate for solids) Vision: Impaired for self-feeding Self-Feeding Abilities: Total assist Patient Positioning: Upright in bed Baseline Vocal Quality: Other (comment);Low vocal intensity (Whispered yes once)    Oral/Motor/Sensory Function Overall Oral Motor/Sensory Function: Moderate impairment   Ice Chips Ice chips: Impaired Presentation: Spoon Oral Phase Impairments: Reduced lingual movement/coordination Oral Phase Functional Implications: Right anterior spillage;Prolonged oral transit   Thin Liquid Thin Liquid: Not tested    Nectar Thick Nectar Thick Liquid: Not tested   Honey Thick Honey Thick Liquid: Not tested   Puree Puree: Impaired Presentation: Spoon Oral Phase Impairments: Reduced labial seal;Reduced lingual movement/coordination Oral Phase Functional Implications: Prolonged oral transit   Solid     Solid: Not tested      Eather Colas 07/31/2021,12:53 PM

## 2021-07-31 NOTE — Care Management Important Message (Signed)
Important Message  Patient Details  Name: Sydney Turner MRN: [id number] Date of Birth: 1941/09/14   Medicare Important Message Given:  N/A - LOS <3 / Initial given by admissions     Johnell Comings 07/31/2021, 5:05 PM

## 2021-07-31 NOTE — Progress Notes (Signed)
Chart reviewed, bedside swallow eval completed. Report to follow. Moderate to severe oral phase dysphagia. Pt was lethargic throughout eval needinging continuos stimulation.Rec continue NPO for now, except ice chips after oral care. Discussed with daughter. Will reassess as alertness improves in hopes of starting a diet.

## 2021-07-31 NOTE — Progress Notes (Signed)
Re visited Pt. As previously requested by daughter. No change, Pt sleeping with mouth open. Daughter is hopeful that Pt will return to baseline but has mentioned that feeding tube is not an option if she is not able to take PO's. ST to follow up tomorrow in hopes of improvement.

## 2021-07-31 NOTE — TOC Initial Note (Signed)
Transition of Care [facility]) - Initial/Assessment Note    Patient Details  Name: Sydney Turner MRN: [id number] Date of Birth: April 25, 1941  [facility]) CM/SW Contact:    Caryn Section, RN Phone Number: 07/31/2021, 2:21 PM  Clinical Narrative:  Patient sleeping upon initial encounter.  Daughter, Sydney Turner at bedside.  As per daughter and facility, patient is a resident of [facility] and it is anticipated that patient will return to [facility] on discharge.  Daughter verbalizes no further TOC needs at this time, TOC contact information provided for questions/concerns.                 Expected Discharge Plan: Skilled Nursing Facility Barriers to Discharge: Continued Medical Work up   Patient Goals and CMS Choice     Choice offered to / list presented to : NA  Expected Discharge Plan and Services Expected Discharge Plan: Skilled Nursing Facility   Discharge Planning Services: CM Consult Post Acute Care Choice: Skilled Nursing Facility Living arrangements for the past 2 months: Skilled Nursing Facility                 DME Arranged:  (n/a patient has equipment in facility)         HH Arranged:  (n/a facility resident)          Prior Living Arrangements/Services Living arrangements for the past 2 months: Skilled Nursing Facility Lives with:: Facility Resident Patient language and need for interpreter reviewed:: Yes (Patient sleeping, spoke with daughter.) Do you feel safe going back to the place where you live?: Yes      Need for Family Participation in Patient Care: Yes (Comment)   Current home services:  (n/a facility resident) Criminal Activity/Legal Involvement Pertinent to Current Situation/Hospitalization: No - Comment as needed  Activities of Daily Living Home Assistive Devices/Equipment: Wheelchair ADL Screening (condition at time of admission) Patient's cognitive ability adequate to safely complete daily activities?: No Is the patient deaf or have difficulty  hearing?: No Does the patient have difficulty seeing, even when wearing glasses/contacts?: No Does the patient have difficulty concentrating, remembering, or making decisions?: Yes Patient able to express need for assistance with ADLs?: No Does the patient have difficulty dressing or bathing?: Yes Independently performs ADLs?: No Does the patient have difficulty walking or climbing stairs?: Yes Weakness of Legs: Both Weakness of Arms/Hands: Both  Permission Sought/Granted Permission sought to share information with : Facility Industrial/product designer granted to share information with : Yes, Verbal Permission Granted     Permission granted to share info w AGENCY: [facility]        Emotional Assessment Appearance:: Appears stated age Attitude/Demeanor/Rapport:  (n/a patient sleeping) Affect (typically observed):  (n/a patient sleeping) Orientation: :  (n/a patient sleeping) Alcohol / Substance Use: Not Applicable Psych Involvement: No (comment)  Admission diagnosis:  Dehydration [E86.0] Hypernatremia [E87.0] Acute cystitis without hematuria [N30.00] Low TSH level [R79.89] Altered mental status, unspecified altered mental status type [R41.82] AMS (altered mental status) [R41.82] Patient Active Problem List   Diagnosis Date Noted   UTI (urinary tract infection) 08-14-21   AMS (altered mental status) 2021/08/14   Hypernatremia 08-14-2021   Closed left hip fracture (HCC) 05/08/2019   Sepsis secondary to UTI (HCC) 02/03/2017   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   

## 2021-08-01 DIAGNOSIS — R4182 Altered mental status, unspecified: Secondary | ICD-10-CM | POA: Diagnosis not present

## 2021-08-01 LAB — BASIC METABOLIC PANEL
Anion gap: 7 (ref 5–15)
BUN: 18 mg/dL (ref 8–23)
CO2: 32 mmol/L (ref 22–32)
Calcium: 8.8 mg/dL — ABNORMAL LOW (ref 8.9–10.3)
Chloride: 111 mmol/L (ref 98–111)
Creatinine, Ser: 0.55 mg/dL (ref 0.44–1.00)
GFR, Estimated: >60 mL/min (ref >60)
Glucose, Bld: 111 mg/dL — ABNORMAL HIGH (ref 70–99)
Potassium: 2.9 mmol/L — ABNORMAL LOW (ref 3.5–5.1)
Sodium: 150 mmol/L — ABNORMAL HIGH (ref 135–145)

## 2021-08-01 LAB — URINE CULTURE

## 2021-08-01 MED ORDER — KCL IN DEXTROSE-NACL 40-5-0.45 MEQ/L-%-% IV SOLN
INTRAVENOUS | Status: DC
  Filled 2021-08-01 (×7): qty 1000

## 2021-08-01 MED ORDER — POTASSIUM CHLORIDE 2 MEQ/ML IV SOLN: INTRAVENOUS | Status: DC

## 2021-08-01 NOTE — TOC Progression Note (Signed)
Transition of Care [facility]) - Progression Note    Patient Details  Name: Sydney Turner MRN: [id number] Date of Birth: 03/04/1941  [facility] Care [facility]) CM/SW Contact  Caryn Section, RN Phone Number: 08/01/2021, 2:50 PM  Clinical Narrative:  [facility] hospice in to see patient to discuss role and services offered.  Daughter would like to wait to see how/if patient progresses to make a decision.  TOC to follow.     Expected Discharge Plan: Skilled Nursing Facility Barriers to Discharge: Continued Medical Work up  Expected Discharge Plan and Services Expected Discharge Plan: Skilled Nursing Facility   Discharge Planning Services: CM Consult Post Acute Care Choice: Skilled Nursing Facility Living arrangements for the past 2 months: Skilled Nursing Facility                 DME Arranged:  (n/a patient has equipment in facility)         HH Arranged:  (n/a facility resident)           Social Determinants of Health (SDOH) Interventions    Readmission Risk Interventions No flowsheet data found.

## 2021-08-01 NOTE — Progress Notes (Signed)
Speech Language Pathology Treatment: Dysphagia  Patient Details Name: Sydney Turner MRN: [id number] DOB: 06/22/1941 Today's Date: 08/01/2021 Time: 0830-0930 SLP Time Calculation (min) (ACUTE ONLY): 60 min  Assessment / Plan / Recommendation Clinical Impression  Pt seen today for ongoing po trials/assessment in hopes to establish least restrictive oral diet. Dtrs present in room this AM. Pt awake w/ eyes open; noted Baseline open-mouth posture at rest and when positioning her upright in bed for po trials. Noted Kyphosis impacting positioning.  Pt appears to present w/ oropharyngeal phase dysphagia in light of declined Cognitive status; Baseline Advanced Dementia. This can impact her overall awareness/timing/strength of swallow and safety during po tasks which increases risk for aspiration, choking. Following aspiration precautions and using a modified diet consistency w/ Nectar liquids appeared helpful in reducing her risk for aspiration. Concern noted for her ability to adequate maintain/meet hydration and nutritional needs at this time d/t overall presentation. She required MOD verbal/tactile/visual cues for during po tasks.   Pt consumed trials of ice chips, purees, and Nectar liquids VIA TSP w/ overt clinical s/s of aspiration noted w/ the Nectar liquids x1 when feeding(delayed throat clearing/Cough). W/ most trials, audible and multiple swallows were noted. No decline in respiratory status during/post trials however. Unable to assess vocal quality. Oral phase was c/b prolonged bolus management control for A-P transfer; oral holding noted. Anterior loss occurred at times though potentially impacted from slight lean d/t positioning in bed. Fair oral clearing occurred post swallows; slight-min bolus residue remained orally(diffuse). Encouraged lingual sweeping and f/u, Dry swallows w/ TIME B/T trials to aid oropharyngeal clearing.  D/t Cognitive decline and weakness, pt did not attempt to self-feed.  Some oral confusion w/ oral care.     D/t pt's Baseline, declined Cognitive status w/ advanced Dementia. and her risk for aspiration, recommend initiation of the dysphagia level 1(Puree) w/ Nectar liquids via TSP; aspiration precautions; feeding assistance; reduce Distractions during meals and engage pt during po's at meal for self-feeding. Check for oral clearing w/ all po's given. Alternate foods/liquids during meals. Pills Crushed in Puree for safer swallowing. MD/NSG updated. ST services recommends follow w/ Palliative Care for GOC and education re: impact of Cognitive decline/Dementia on swallowing; risk for aspiration and its impact. Recommend Dietician f/u for support.  ST services can follow pt at discharge for further education as needed. Largely suspect that pt's Dementia and risk for aspiration could hamper upgrade of diet. Precautions posted in room; information given on Dysphagia Drink Cups for family to order for pt. Education w/ Family on benefits of dysphagia diet; impact of Cognitive decline/Dementia on swallowing; Nectar consistency liquids; food/drink consistencies; aspiration precautions; ice chip protocol post oral care for Pleasure PRN but not w/ meals.        HPI HPI: Per admitting H&P " Sydney Turner is a 80 y.o. female seen in ed with complaints of mental status.  Patient is a resident of a skilled nursing facility where blood work was obtained on was concerning for dehydration.  Per MD, pt has Palliative Care following at her SNF.  Per report family members concerned that patient may have a urinary tract infection and has had decreased p.o. intake and urine output for the past few days and has been declining over past few weeks.  Patient at baseline is nonverbal bedridden and does not follow commands.  Code status is DNR.  HPI is unobtainable due to Advanced Dementia.  Per daughter, mom has Advanced Dementia and answers yes and  no questions. Limited mobility. Pt has past medical  history of Alzheimer's Dementia, osteoarthritis, hypothyroidism, UTI, Kyphosis.  Patient also has an allergy to sulfa and amoxicillin, penicillin.".  Per Daughters, pt's Diet was recently changes to a Dyshpagia diet w/ thickened liquids .   CXR: Suboptimal study due to patient positioning. Within this confines,  no radiographic evidence of acute cardiopulmonary process.      SLP Plan  Continue with current plan of care      Recommendations for follow up therapy are one component of a multi-disciplinary discharge planning process, led by the attending physician.  Recommendations may be updated based on patient status, additional functional criteria and insurance authorization.    Recommendations  Diet recommendations: Dysphagia 1 (puree);Nectar-thick liquid Liquids provided via: Teaspoon (only) Medication Administration: Crushed with puree Supervision: Staff to assist with self feeding;Full supervision/cueing for compensatory strategies Compensations: Minimize environmental distractions;Slow rate;Small sips/bites;Lingual sweep for clearance of pocketing;Multiple dry swallows after each bite/sip;Monitor for anterior loss;Follow solids with liquid Postural Changes and/or Swallow Maneuvers: Seated upright 90 degrees;Upright 30-60 min after meal                General recommendations:  (Dietician f/u; Palliative care f/u (ongoing at [facility])) Oral Care Recommendations: Oral care BID;Oral care prior to ice chip/H20;Oral care before and after PO;Staff/trained caregiver to provide oral care Follow up Recommendations: Skilled Nursing facility (baseline) SLP Visit Diagnosis: Dysphagia, oropharyngeal phase (R13.12) (baseline Dementia) Plan: Continue with current plan of care       GO                 Jerilynn Som, MS, CCC-SLP Speech Language Pathologist Rehab Services [phone] [facility]  08/01/2021, 2:02 PM

## 2021-08-01 NOTE — Progress Notes (Signed)
[facility] hospital Liaison note:  Requested by attending MD Dr. Ashok Pall to speak with patient's daughter regarding hospice services at her SNF/[facility]. Writer spoke in the room with patient's daughter Berkeley Veldman. Mrs. Darley has had hospice services in the past (2020), services explained to [place].  She reports her sister [name] is still hesitant about hospice services and is hopeful that their mother will "perk up". Cordelia Pen would like to wait for another 24-48 hrs to see if there is improvement in her mother's condition. Hospice information left with Cordelia Pen. TOC Elena and attending Dr. Ashok Pall updated.  Thank you for the opportunity to be involved in the care of this patient and her family. Dayna Barker BSN, RN, [facility] [employer] [phone]

## 2021-08-01 NOTE — Progress Notes (Addendum)
PROGRESS NOTE    Sydney Turner  VZD:[id number] DOB: 06/23/1941 DOA: 2021/08/14 PCP: Keane Police, MD  Outpatient Specialists: none    Brief Narrative:   From admission hpi:  Sydney Turner is a 80 y.o. female seen in ed with complaints of mental status.  Patient is a resident of a skilled nursing facility where blood work was obtained on was concerning for dehydration.  Per report family members concerned that patient may have a urinary tract infection and has had decreased p.o. intake and urine output for the past few days and has been declining over past few weeks.  Patient at baseline is nonverbal bedridden and does not follow commands.  CODE STATUS is DO NOT RESUSCITATE. HPI /ROS is otherwise unobtainable due to dementia and age. Per daughter mom has advanced dementia and answers yes and no questions. Limited mobility.   Pt has past medical history of Alzheimer's dementia, osteoarthritis, hypothyroidism, UTI. Patient also has an allergy to sulfa and amoxicillin, penicillin.   Assessment & Plan:   Principal Problem:   AMS (altered mental status) Active Problems:   Hypothyroidism (acquired)   UTI (urinary tract infection)   Hypernatremia  # Dehydration # Acute metabolic encephalopathy # Hypernatremia, chronic # Dysphagia Daughter concerned not receiving adequate care at facility, not being offered food/drink. Declining advanced dementia may be ultimate cause. Followed by palliative as outpatient. Sodium 157 on admission, 150 today. SLP has advanced diet but dysphagia is severe, not adequate to sustain - continue fluids  # Advanced dementia I am concerned current presentation is manifestation of advanced dementia, that patient is at end of life. Is followed by palliative as outpt. Daughter today some interest in hospice, I have contacted hospice who will f/u with family.   # Pyuria Unclear whether this is symptomatic uti; family very desirous of treatment. Urine  culture w/ multiple species. S/p 3 days ceftriaxone - stop abx and monitor  # Hypokalemia Likely 2/2 reduced po. 2.9 today, mg wnl yesterday - increase kcl in fluids from 20 to 40 meq - monitor  # Macrocytosis B12 wnl  # Hypothyroidism TSH low, free T4 elevated. On levothyroxine 125 mcg as outpt - hold levothyroxine, repeat TFTs in 4-6 weeks    DVT prophylaxis: lovenox Code Status: dnr Family Communication: daughter [name] updated @ bedside 10/13. Daughter sherry updated @ bedside 10/14. Daughter penny updated telephonically 10/14  Level of care: Med-Surg Status is: Inpatient  Remains inpatient appropriate because:Inpatient level of care appropriate due to severity of illness  Dispo: The patient is from: SNF              Anticipated d/c is to: SNF              Patient currently is not medically stable to d/c.   Difficult to place patient No        Consultants:  none  Procedures: none  Antimicrobials:  Ceftriaxone 10/12>10/14   Subjective: This morning in bed, not responsive, NAD  Objective: Vitals:   07/31/21 1952 08/01/21 0126 08/01/21 0420 08/01/21 0825  BP: (!) 145/67 (!) 147/80 (!) 157/68 135/74  Pulse: (!) 107 (!) 102 99 (!) 101  Resp: 12 16 14 18   Temp: 99 F (37.2 C) 98.3 F (36.8 C) 98.5 F (36.9 C) 99.8 F (37.7 C)  TempSrc:      SpO2: 96% 95% 97% 98%  Weight:      Height:        Intake/Output Summary (Last 24 hours) at  08/01/2021 1125 Last data filed at 08/01/2021 0346 Gross per 24 hour  Intake 998.14 ml  Output 300 ml  Net 698.14 ml   Filed Weights   08/05/2021 1402  Weight: 59 kg    Examination:  General exam: Appears calm and comfortable. Chronically ill appearing  Respiratory system: Clear to auscultation. Respiratory effort normal. Cardiovascular system: S1 & S2 heard, RRR. No JVD, murmurs, rubs, gallops or clicks. No pedal edema. Gastrointestinal system: Abdomen is nondistended, soft and nontender. No organomegaly or  masses felt. Normal bowel sounds heard. Central nervous system: awake. Not communicative Extremities: moves all 4 Skin: No rashes, lesions or ulcers. Sacrum examined Psychiatry: non-verbal, doesn't follow commands.    Data Reviewed: I have personally reviewed following labs and imaging studies  CBC: Recent Labs  Lab 07/26/2021 1425  WBC 6.4  HGB 13.2  HCT 40.5  MCV 104.1*  PLT 247   Basic Metabolic Panel: Recent Labs  Lab 08/04/2021 1425 07/31/21 0846 07/31/21 1628 08/01/21 0730  NA 157* 155*  --  150*  K 3.5 3.1*  --  2.9*  CL 118* 116*  --  111  CO2 27 29  --  32  GLUCOSE 123* 106*  --  111*  BUN 33* 22  --  18  CREATININE 0.76 0.63  --  0.55  CALCIUM 10.3 9.4  --  8.8*  MG  --   --  1.9  --    GFR: Estimated Creatinine Clearance: 45.8 mL/min (by C-G formula based on SCr of 0.55 mg/dL). Liver Function Tests: Recent Labs  Lab 08/10/2021 1425  AST 45*  ALT 40  ALKPHOS 58  BILITOT 1.2  PROT 7.3  ALBUMIN 3.6   No results for input(s): LIPASE, AMYLASE in the last 168 hours. Recent Labs  Lab 07/19/2021 1614  AMMONIA 23   Coagulation Profile: No results for input(s): INR, PROTIME in the last 168 hours. Cardiac Enzymes: No results for input(s): CKTOTAL, CKMB, CKMBINDEX, TROPONINI in the last 168 hours. BNP (last 3 results) No results for input(s): PROBNP in the last 8760 hours. HbA1C: No results for input(s): HGBA1C in the last 72 hours. CBG: No results for input(s): GLUCAP in the last 168 hours. Lipid Profile: No results for input(s): CHOL, HDL, LDLCALC, TRIG, CHOLHDL, LDLDIRECT in the last 72 hours. Thyroid Function Tests: Recent Labs    08/16/2021 1623 08/02/2021 1713  TSH 0.020*  --   FREET4  --  3.19*   Anemia Panel: Recent Labs    07/31/21 0846  VITAMINB12 481   Urine analysis:    Component Value Date/Time   COLORURINE YELLOW (A) 07/28/2021 1530   APPEARANCEUR TURBID (A) 08/09/2021 1530   APPEARANCEUR CLEAR 03/23/2014 1953   LABSPEC 1.015  07/22/2021 1530   LABSPEC 1.020 03/23/2014 1953   PHURINE 8.0 07/25/2021 1530   GLUCOSEU NEGATIVE 07/19/2021 1530   GLUCOSEU NEGATIVE 03/23/2014 1953   HGBUR SMALL (A) 08/06/2021 1530   BILIRUBINUR NEGATIVE 08/01/2021 1530   BILIRUBINUR NEGATIVE 03/23/2014 1953   KETONESUR NEGATIVE 08/14/2021 1530   PROTEINUR 100 (A) 07/21/2021 1530   NITRITE NEGATIVE 07/29/2021 1530   LEUKOCYTESUR MODERATE (A) 08/02/2021 1530   LEUKOCYTESUR TRACE 03/23/2014 1953   Sepsis Labs: @LABRCNTIP (procalcitonin:4,lacticidven:4)  ) Recent Results (from the past 240 hour(s))  Resp Panel by RT-PCR (Flu A&B, Covid) Nasopharyngeal Swab     Status: None   Collection Time: 08/02/2021  4:12 PM   Specimen: Nasopharyngeal Swab; Nasopharyngeal(NP) swabs in vial transport medium  Result Value Ref Range  Status   SARS Coronavirus 2 by RT PCR NEGATIVE NEGATIVE Final    Comment: (NOTE) SARS-CoV-2 target nucleic acids are NOT DETECTED.  The SARS-CoV-2 RNA is generally detectable in upper respiratory specimens during the acute phase of infection. The lowest concentration of SARS-CoV-2 viral copies this assay can detect is 138 copies/mL. A negative result does not preclude SARS-Cov-2 infection and should not be used as the sole basis for treatment or other patient management decisions. A negative result may occur with  improper specimen collection/handling, submission of specimen other than nasopharyngeal swab, presence of viral mutation(s) within the areas targeted by this assay, and inadequate number of viral copies(<138 copies/mL). A negative result must be combined with clinical observations, patient history, and epidemiological information. The expected result is Negative.  Fact Sheet for Patients:  BloggerCourse.com  Fact Sheet for Healthcare Providers:  SeriousBroker.it  This test is no t yet approved or cleared by the [place] FDA and  has been authorized  for detection and/or diagnosis of SARS-CoV-2 by FDA under an Emergency Use Authorization (EUA). This EUA will remain  in effect (meaning this test can be used) for the duration of the COVID-19 declaration under Section 564(b)(1) of the Act, 21 U.S.C.section 360bbb-3(b)(1), unless the authorization is terminated  or revoked sooner.       Influenza A by PCR NEGATIVE NEGATIVE Final   Influenza B by PCR NEGATIVE NEGATIVE Final    Comment: (NOTE) The Xpert Xpress SARS-CoV-2/FLU/RSV plus assay is intended as an aid in the diagnosis of influenza from Nasopharyngeal swab specimens and should not be used as a sole basis for treatment. Nasal washings and aspirates are unacceptable for Xpert Xpress SARS-CoV-2/FLU/RSV testing.  Fact Sheet for Patients: BloggerCourse.com  Fact Sheet for Healthcare Providers: SeriousBroker.it  This test is not yet approved or cleared by the [place] FDA and has been authorized for detection and/or diagnosis of SARS-CoV-2 by FDA under an Emergency Use Authorization (EUA). This EUA will remain in effect (meaning this test can be used) for the duration of the COVID-19 declaration under Section 564(b)(1) of the Act, 21 U.S.C. section 360bbb-3(b)(1), unless the authorization is terminated or revoked.  Performed at [facility], [address], [place], [place] [address]   Urine Culture     Status: Abnormal   Collection Time: 2021-08-21  5:40 PM   Specimen: Urine, Random  Result Value Ref Range Status   Specimen Description   Final    URINE, RANDOM Performed at [facility], [address], [place], [place] [address]    Special Requests   Final    NONE Performed at [facility], [address] [place], [place] [address]    Culture MULTIPLE SPECIES PRESENT, SUGGEST RECOLLECTION (A)  Final   Report Status 08/01/2021 FINAL  Final  Blood culture (single)     Status: None  (Preliminary result)   Collection Time: 2021-08-21 11:01 PM   Specimen: BLOOD  Result Value Ref Range Status   Specimen Description BLOOD LEFT HAND  Final   Special Requests   Final    BOTTLES DRAWN AEROBIC ONLY Blood Culture results may not be optimal due to an inadequate volume of blood received in culture bottles   Culture   Final    NO GROWTH 2 DAYS Performed at [facility], [address], [place], [place] [address]    Report Status PENDING  Incomplete         Radiology Studies: CT HEAD WO CONTRAST ( )  Result Date: 08/07/2021 CLINICAL DATA:  Altered mental status EXAM: CT HEAD WITHOUT CONTRAST TECHNIQUE: Contiguous axial images were obtained from the base of the skull through the vertex without intravenous contrast. COMPARISON:  Head CT 11/30/2019 FINDINGS: Brain: There is no acute intracranial hemorrhage, extra-axial fluid collection, or acute infarct. There is moderate global parenchymal volume loss with enlargement of the ventricular system and extra-axial CSF spaces. There is severe hippocampal atrophy, right worse than left. These findings are similar to the prior study Confluent hypodensity in the subcortical and periventricular white matter likely reflects sequela of advanced chronic white matter microangiopathy. There is no mass lesion.  There is no midline shift. Vascular: There is calcification of the bilateral cavernous ICAs. Skull: Normal. Negative for fracture or focal lesion. Sinuses/Orbits: The paranasal sinuses are clear. Bilateral lens implants are in place. The globes and orbits are otherwise unremarkable. Other: There is a left mastoid effusion. IMPRESSION: 1. No acute intracranial pathology. 2. Moderate global parenchymal volume loss with severe hippocampal atrophy, similar to the prior study. 3. Advanced chronic white matter microangiopathy, also not significantly changed. 4. Left mastoid effusion. Electronically Signed   By: Lesia Hausen M.D.   On:  07/29/2021 16:47   DG Chest Portable 1 View  Result Date: 07/27/2021 CLINICAL DATA:  Tachycardia, altered mental status EXAM: PORTABLE CHEST 1 VIEW COMPARISON:  Chest radiograph 02/02/2017 FINDINGS: Evaluation is degraded by suboptimal patient positioning. The lung apices are partially obscured. Within this confine: The cardiomediastinal silhouette is grossly within normal limits. Lung volumes are low. Linear opacities in the left lung base likely reflect subsegmental atelectasis. There is no focal consolidation. There is no significant pleural effusion. There is no definite pneumothorax No acute osseous abnormality is identified. IMPRESSION: Suboptimal study due to patient positioning. Within this confines, no radiographic evidence of acute cardiopulmonary process. Electronically Signed   By: Lesia Hausen M.D.   On: 08/10/2021 16:43        Scheduled Meds:  enoxaparin (LOVENOX) injection  40 mg Subcutaneous Q24H   Continuous Infusions:  cefTRIAXone (ROCEPHIN)  IV 75 mL/hr at 08/01/21 0346   dextrose 5 % and 0.45 % NaCl with KCl 40 mEq/L       LOS: 2 days    Time spent: 40 min    Silvano Bilis, MD [facility]   If 7PM-7AM, please contact night-coverage [email or web address] Password [email or web address] 08/01/2021, 11:25 AM

## 2021-08-02 DIAGNOSIS — R4182 Altered mental status, unspecified: Secondary | ICD-10-CM | POA: Diagnosis not present

## 2021-08-02 LAB — BASIC METABOLIC PANEL
Anion gap: 5 (ref 5–15)
BUN: 14 mg/dL (ref 8–23)
CO2: 30 mmol/L (ref 22–32)
Calcium: 8.7 mg/dL — ABNORMAL LOW (ref 8.9–10.3)
Chloride: 110 mmol/L (ref 98–111)
Creatinine, Ser: 0.63 mg/dL (ref 0.44–1.00)
GFR, Estimated: >60 mL/min (ref >60)
Glucose, Bld: 116 mg/dL — ABNORMAL HIGH (ref 70–99)
Potassium: 3.5 mmol/L (ref 3.5–5.1)
Sodium: 145 mmol/L (ref 135–145)

## 2021-08-02 MED ORDER — ENOXAPARIN SODIUM 30 MG/0.3ML IJ SOSY: 30.0000 mg | PREFILLED_SYRINGE | INTRAMUSCULAR | Status: DC

## 2021-08-02 NOTE — Progress Notes (Addendum)
Chart reviewed, Pt visited, Daughter in the room and reports Pt ate small amounts yesterday but has not been awake to take PO's today. Discussed pleasure verses comfort as far as PO's and not to feed when Pt is more congested or uninterested in eating. Noted congested cough while ST was present. Discussed with Nsg who plans to set up suction canister to assist with clearing any coughed phlegm and upper airway when needed. Reviewed MD and hospice notes. Daughter is hopeful that Pt can stay here if end of life is soon. She also mentioned hospice home. ST to follow up in a few days in the event that we can assist any further. Daughter was appreciative and realistic about her mothers prognosis.

## 2021-08-02 NOTE — Progress Notes (Signed)
PROGRESS NOTE    Sydney Turner  NGE:[id number] DOB: 10/04/1941 DOA: 08/15/2021 PCP: Keane Police, MD  Outpatient Specialists: none    Brief Narrative:   From admission hpi:  Sydney Turner is a 80 y.o. female seen in ed with complaints of mental status.  Patient is a resident of a skilled nursing facility where blood work was obtained on was concerning for dehydration.  Per report family members concerned that patient may have a urinary tract infection and has had decreased p.o. intake and urine output for the past few days and has been declining over past few weeks.  Patient at baseline is nonverbal bedridden and does not follow commands.  CODE STATUS is DO NOT RESUSCITATE. HPI /ROS is otherwise unobtainable due to dementia and age. Per daughter mom has advanced dementia and answers yes and no questions. Limited mobility.    Assessment & Plan:   Principal Problem:   AMS (altered mental status) Active Problems:   Hypothyroidism (acquired)   UTI (urinary tract infection)   Hypernatremia  # Dehydration # Acute metabolic encephalopathy # Hypernatremia, chronic # Dysphagia Advanced dementia appears to be culprit. Sodium normalized with IV hydration. Remains somnolent, dysphagia is severe.  - continue fluids for now, family wants to continue to wait and see if there is any improvement  # Advanced dementia I am concerned current presentation is manifestation of advanced dementia, that patient is at end of life. Is followed by palliative as outpt. Hospice has been consulted. Family is considering transition to comfort care and hospice. For now we are treating the treatable.  # Pyuria No overt uti symptoms. Urine culture w/ multiple species. S/p 3 days ceftriaxone - monitor  # Hypokalemia Resolved w/ addition of kcl to fluids - cont fluids w/ kcl - monitor  # Macrocytosis B12 wnl  # Hypothyroidism TSH low, free T4 elevated. On levothyroxine 125 mcg as outpt -  hold levothyroxine, repeat TFTs in 4-6 weeks    DVT prophylaxis: lovenox Code Status: dnr Family Communication: daughters updated @ bedside 10/15  Level of care: Med-Surg Status is: Inpatient  Remains inpatient appropriate because:Inpatient level of care appropriate due to severity of illness  Dispo: The patient is from: SNF              Anticipated d/c is to: SNF vs inpt hospice              Patient currently is not medically stable to d/c.   Difficult to place patient No        Consultants:  none  Procedures: none  Antimicrobials:  Ceftriaxone 10/12>10/14   Subjective: This morning in bed, not responsive, NAD  Objective: Vitals:   08/02/21 0422 08/02/21 0504 08/02/21 0816 08/02/21 1128  BP:  (!) 163/77 (!) 161/86 (!) 146/53  Pulse:  100 (!) 104 (!) 102  Resp:  16 20 20   Temp:  98.3 F (36.8 C) 98 F (36.7 C) 99.6 F (37.6 C)  TempSrc:  Oral  Axillary  SpO2:  95% 100% 98%  Weight: 57.7 kg     Height:        Intake/Output Summary (Last 24 hours) at 08/02/2021 1236 Last data filed at 08/02/2021 08/04/2021 Gross per 24 hour  Intake 1208.75 ml  Output 1600 ml  Net -391.25 ml   Filed Weights   08/01/2021 1402 08/02/21 0422  Weight: 59 kg 57.7 kg    Examination:  General exam: Appears calm and comfortable. Chronically ill appearing  Respiratory system:  Clear to auscultation. Respiratory effort normal. Cardiovascular system: S1 & S2 heard, RRR. No JVD, murmurs, rubs, gallops or clicks. No pedal edema. Gastrointestinal system: Abdomen is nondistended, soft and nontender. No organomegaly or masses felt. Normal bowel sounds heard. Central nervous system: awake. Not communicative Extremities: moves all 4 Skin: No rashes, lesions or ulcers. Sacrum examined Psychiatry: non-verbal, doesn't follow commands.    Data Reviewed: I have personally reviewed following labs and imaging studies  CBC: Recent Labs  Lab Aug 28, 2021 1425  WBC 6.4  HGB 13.2  HCT 40.5   MCV 104.1*  PLT 247   Basic Metabolic Panel: Recent Labs  Lab 28-Aug-2021 1425 07/31/21 0846 07/31/21 1628 08/01/21 0730 08/02/21 0523  NA 157* 155*  --  150* 145  K 3.5 3.1*  --  2.9* 3.5  CL 118* 116*  --  111 110  CO2 27 29  --  32 30  GLUCOSE 123* 106*  --  111* 116*  BUN 33* 22  --  18 14  CREATININE 0.76 0.63  --  0.55 0.63  CALCIUM 10.3 9.4  --  8.8* 8.7*  MG  --   --  1.9  --   --    GFR: Estimated Creatinine Clearance: 45.4 mL/min (by C-G formula based on SCr of 0.63 mg/dL). Liver Function Tests: Recent Labs  Lab 2021-08-28 1425  AST 45*  ALT 40  ALKPHOS 58  BILITOT 1.2  PROT 7.3  ALBUMIN 3.6   No results for input(s): LIPASE, AMYLASE in the last 168 hours. Recent Labs  Lab 2021-08-28 1614  AMMONIA 23   Coagulation Profile: No results for input(s): INR, PROTIME in the last 168 hours. Cardiac Enzymes: No results for input(s): CKTOTAL, CKMB, CKMBINDEX, TROPONINI in the last 168 hours. BNP (last 3 results) No results for input(s): PROBNP in the last 8760 hours. HbA1C: No results for input(s): HGBA1C in the last 72 hours. CBG: No results for input(s): GLUCAP in the last 168 hours. Lipid Profile: No results for input(s): CHOL, HDL, LDLCALC, TRIG, CHOLHDL, LDLDIRECT in the last 72 hours. Thyroid Function Tests: Recent Labs    28-Aug-2021 1623 Aug 28, 2021 1713  TSH 0.020*  --   FREET4  --  3.19*   Anemia Panel: Recent Labs    07/31/21 0846  VITAMINB12 481   Urine analysis:    Component Value Date/Time   COLORURINE YELLOW (A) Aug 28, 2021 1530   APPEARANCEUR TURBID (A) 08/28/2021 1530   APPEARANCEUR CLEAR 03/23/2014 1953   LABSPEC 1.015 2021/08/28 1530   LABSPEC 1.020 03/23/2014 1953   PHURINE 8.0 28-Aug-2021 1530   GLUCOSEU NEGATIVE 08-28-21 1530   GLUCOSEU NEGATIVE 03/23/2014 1953   HGBUR SMALL (A) 08/28/21 1530   BILIRUBINUR NEGATIVE 08/28/2021 1530   BILIRUBINUR NEGATIVE 03/23/2014 1953   KETONESUR NEGATIVE 28-Aug-2021 1530   PROTEINUR 100  (A) 08-28-2021 1530   NITRITE NEGATIVE 08/28/21 1530   LEUKOCYTESUR MODERATE (A) 2021/08/28 1530   LEUKOCYTESUR TRACE 03/23/2014 1953   Sepsis Labs: @LABRCNTIP (procalcitonin:4,lacticidven:4)  ) Recent Results (from the past 240 hour(s))  Resp Panel by RT-PCR (Flu A&B, Covid) Nasopharyngeal Swab     Status: None   Collection Time: 08-28-21  4:12 PM   Specimen: Nasopharyngeal Swab; Nasopharyngeal(NP) swabs in vial transport medium  Result Value Ref Range Status   SARS Coronavirus 2 by RT PCR NEGATIVE NEGATIVE Final    Comment: (NOTE) SARS-CoV-2 target nucleic acids are NOT DETECTED.  The SARS-CoV-2 RNA is generally detectable in upper respiratory specimens during the acute phase of  infection. The lowest concentration of SARS-CoV-2 viral copies this assay can detect is 138 copies/mL. A negative result does not preclude SARS-Cov-2 infection and should not be used as the sole basis for treatment or other patient management decisions. A negative result may occur with  improper specimen collection/handling, submission of specimen other than nasopharyngeal swab, presence of viral mutation(s) within the areas targeted by this assay, and inadequate number of viral copies(<138 copies/mL). A negative result must be combined with clinical observations, patient history, and epidemiological information. The expected result is Negative.  Fact Sheet for Patients:  BloggerCourse.com  Fact Sheet for Healthcare Providers:  SeriousBroker.it  This test is no t yet approved or cleared by the [place] FDA and  has been authorized for detection and/or diagnosis of SARS-CoV-2 by FDA under an Emergency Use Authorization (EUA). This EUA will remain  in effect (meaning this test can be used) for the duration of the COVID-19 declaration under Section 564(b)(1) of the Act, 21 U.S.C.section 360bbb-3(b)(1), unless the authorization is terminated  or  revoked sooner.       Influenza A by PCR NEGATIVE NEGATIVE Final   Influenza B by PCR NEGATIVE NEGATIVE Final    Comment: (NOTE) The Xpert Xpress SARS-CoV-2/FLU/RSV plus assay is intended as an aid in the diagnosis of influenza from Nasopharyngeal swab specimens and should not be used as a sole basis for treatment. Nasal washings and aspirates are unacceptable for Xpert Xpress SARS-CoV-2/FLU/RSV testing.  Fact Sheet for Patients: BloggerCourse.com  Fact Sheet for Healthcare Providers: SeriousBroker.it  This test is not yet approved or cleared by the [place] FDA and has been authorized for detection and/or diagnosis of SARS-CoV-2 by FDA under an Emergency Use Authorization (EUA). This EUA will remain in effect (meaning this test can be used) for the duration of the COVID-19 declaration under Section 564(b)(1) of the Act, 21 U.S.C. section 360bbb-3(b)(1), unless the authorization is terminated or revoked.  Performed at [facility], [address], [place], [place] [address]   Urine Culture     Status: Abnormal   Collection Time: Aug 15, 2021  5:40 PM   Specimen: Urine, Random  Result Value Ref Range Status   Specimen Description   Final    URINE, RANDOM Performed at [facility], [address], [place], [place] [address]    Special Requests   Final    NONE Performed at [facility], [address] [place], [place] [address]    Culture MULTIPLE SPECIES PRESENT, SUGGEST RECOLLECTION (A)  Final   Report Status 08/01/2021 FINAL  Final  Blood culture (single)     Status: None (Preliminary result)   Collection Time: 08-15-21 11:01 PM   Specimen: BLOOD  Result Value Ref Range Status   Specimen Description BLOOD LEFT HAND  Final   Special Requests   Final    BOTTLES DRAWN AEROBIC ONLY Blood Culture results may not be optimal due to an inadequate volume of blood received in culture bottles    Culture   Final    NO GROWTH 3 DAYS Performed at [facility], [address] [place], [place] [address]    Report Status PENDING  Incomplete         Radiology Studies: No results found.      Scheduled Meds:  enoxaparin (LOVENOX) injection  40 mg Subcutaneous Q24H   Continuous Infusions:  dextrose 5 % and 0.45 % NaCl with KCl 40 mEq/L 75 mL/hr at 08/02/21 0218     LOS:  3 days    Time spent: 30 min    Silvano Bilis, MD [facility]   If 7PM-7AM, please contact night-coverage [email or web address] Password [facility] 08/02/2021, 12:36 PM

## 2021-08-03 ENCOUNTER — Inpatient Hospital Stay: Payer: Medicare Other

## 2021-08-03 DIAGNOSIS — R4182 Altered mental status, unspecified: Secondary | ICD-10-CM | POA: Diagnosis not present

## 2021-08-03 LAB — URINALYSIS, COMPLETE (UACMP) WITH MICROSCOPIC
Bilirubin Urine: NEGATIVE
Glucose, UA: NEGATIVE mg/dL
Ketones, ur: NEGATIVE mg/dL
Nitrite: NEGATIVE
Protein, ur: NEGATIVE mg/dL
Specific Gravity, Urine: 1.009 (ref 1.005–1.030)
WBC, UA: >50 WBC/hpf — ABNORMAL HIGH (ref 0–5)
pH: 8 (ref 5.0–8.0)

## 2021-08-03 LAB — CBC WITH DIFFERENTIAL/PLATELET
Abs Immature Granulocytes: 0.03 10*3/uL (ref 0.00–0.07)
Basophils Absolute: 0 10*3/uL (ref 0.0–0.1)
Basophils Relative: 0 %
Eosinophils Absolute: 0 10*3/uL (ref 0.0–0.5)
Eosinophils Relative: 1 %
HCT: 33.1 % — ABNORMAL LOW (ref 36.0–46.0)
Hemoglobin: 11 g/dL — ABNORMAL LOW (ref 12.0–15.0)
Immature Granulocytes: 1 %
Lymphocytes Relative: 6 %
Lymphs Abs: 0.4 10*3/uL — ABNORMAL LOW (ref 0.7–4.0)
MCH: 32.8 pg (ref 26.0–34.0)
MCHC: 33.2 g/dL (ref 30.0–36.0)
MCV: 98.8 fL (ref 80.0–100.0)
Monocytes Absolute: 0.6 10*3/uL (ref 0.1–1.0)
Monocytes Relative: 9 %
Neutro Abs: 5.2 10*3/uL (ref 1.7–7.7)
Neutrophils Relative %: 83 %
Platelets: 158 10*3/uL (ref 150–400)
RBC: 3.35 MIL/uL — ABNORMAL LOW (ref 3.87–5.11)
RDW: 13.1 % (ref 11.5–15.5)
WBC: 6.3 10*3/uL (ref 4.0–10.5)
nRBC: 0 % (ref 0.0–0.2)

## 2021-08-03 LAB — COMPREHENSIVE METABOLIC PANEL
ALT: 16 U/L (ref 0–44)
AST: 21 U/L (ref 15–41)
Albumin: 2.9 g/dL — ABNORMAL LOW (ref 3.5–5.0)
Alkaline Phosphatase: 58 U/L (ref 38–126)
Anion gap: 6 (ref 5–15)
BUN: 9 mg/dL (ref 8–23)
CO2: 27 mmol/L (ref 22–32)
Calcium: 8.8 mg/dL — ABNORMAL LOW (ref 8.9–10.3)
Chloride: 103 mmol/L (ref 98–111)
Creatinine, Ser: 0.58 mg/dL (ref 0.44–1.00)
GFR, Estimated: >60 mL/min (ref >60)
Glucose, Bld: 116 mg/dL — ABNORMAL HIGH (ref 70–99)
Potassium: 4.3 mmol/L (ref 3.5–5.1)
Sodium: 136 mmol/L (ref 135–145)
Total Bilirubin: 0.8 mg/dL (ref 0.3–1.2)
Total Protein: 6.7 g/dL (ref 6.5–8.1)

## 2021-08-03 LAB — RESP PANEL BY RT-PCR (FLU A&B, COVID) ARPGX2
Influenza A by PCR: NEGATIVE
Influenza B by PCR: NEGATIVE
SARS Coronavirus 2 by RT PCR: POSITIVE — AB

## 2021-08-03 LAB — BASIC METABOLIC PANEL
Anion gap: 5 (ref 5–15)
BUN: 12 mg/dL (ref 8–23)
CO2: 29 mmol/L (ref 22–32)
Calcium: 8.8 mg/dL — ABNORMAL LOW (ref 8.9–10.3)
Chloride: 109 mmol/L (ref 98–111)
Creatinine, Ser: 0.54 mg/dL (ref 0.44–1.00)
GFR, Estimated: >60 mL/min (ref >60)
Glucose, Bld: 108 mg/dL — ABNORMAL HIGH (ref 70–99)
Potassium: 4.1 mmol/L (ref 3.5–5.1)
Sodium: 143 mmol/L (ref 135–145)

## 2021-08-03 LAB — PROCALCITONIN: Procalcitonin: <0.1 ng/mL

## 2021-08-03 MED ORDER — DM-GUAIFENESIN ER 30-600 MG PO TB12: 1.0000 | ORAL_TABLET | Freq: Two times a day (BID) | ORAL | Status: DC

## 2021-08-03 MED ORDER — ACETAMINOPHEN 650 MG RE SUPP
650.0000 mg | RECTAL | Status: DC | PRN
  Administered 2021-08-03: 17:00:00 650 mg via RECTAL
  Filled 2021-08-03: qty 1

## 2021-08-03 MED ORDER — ENOXAPARIN SODIUM 40 MG/0.4ML IJ SOSY
40.0000 mg | PREFILLED_SYRINGE | INTRAMUSCULAR | Status: DC
  Administered 2021-08-04: 40 mg via SUBCUTANEOUS
  Filled 2021-08-03: qty 0.4

## 2021-08-03 MED ORDER — KCL IN DEXTROSE-NACL 40-5-0.45 MEQ/L-%-% IV SOLN
INTRAVENOUS | Status: DC
  Filled 2021-08-03 (×4): qty 1000

## 2021-08-03 NOTE — Progress Notes (Signed)
PHARMACIST - PHYSICIAN COMMUNICATION  CONCERNING:  Enoxaparin (Lovenox) for DVT Prophylaxis    RECOMMENDATION: Patient was prescribed enoxaprin 30mg  q24 hours for VTE prophylaxis.   Filed Weights   08/19/21 1402 08/02/21 0422 08/03/21 0455  Weight: 59 kg (130 lb) 57.7 kg (127 lb 3.3 oz) 57.2 kg (126 lb 1.7 oz)    Body mass index is 24.63 kg/m.  Estimated Creatinine Clearance: 45.2 mL/min (by C-G formula based on SCr of 0.58 mg/dL).   Patient is candidate for enoxaparin 40mg  every 24 hours based on CrCl >33ml/min  DESCRIPTION: Pharmacy has adjusted enoxaparin dose per [facility] policy.  Patient is now receiving enoxaparin 40 mg every 24 hours    31m, PharmD Clinical Pharmacist  08/03/2021 6:45 PM

## 2021-08-03 NOTE — Progress Notes (Signed)
   08/03/21 1635  Assess: MEWS Score  Temp (!) 101.2 F (38.4 C)  Pulse Rate (!) 105  Assess: MEWS Score  MEWS Temp 1  MEWS Systolic 0  MEWS Pulse 1  MEWS RR 0  MEWS LOC 0  MEWS Score 2  MEWS Score Color Yellow  Assess: if the MEWS score is Yellow or Red  Were vital signs taken at a resting state? Yes  Focused Assessment Change from prior assessment (see assessment flowsheet)  Does the patient meet 2 or more of the SIRS criteria? No  MEWS guidelines implemented *See Row Information* Yes  Treat  Pain Scale PAINAD  Pain Score 0  Breathing 0  Negative Vocalization 0  Facial Expression 0  Body Language 0  Consolability 0  PAINAD Score 0  Take Vital Signs  Increase Vital Sign Frequency  Yellow: Q 2hr X 2 then Q 4hr X 2, if remains yellow, continue Q 4hrs  Escalate  MEWS: Escalate Yellow: discuss with charge nurse/RN and consider discussing with provider and RRT  Notify: Charge Nurse/RN  Name of Charge Nurse/RN Notified Curtis Sites  Date Charge Nurse/RN Notified 08/03/21  Time Charge Nurse/RN Notified 1643  Notify: Provider  Provider Name/Title Dr Shonna Chock  Date Provider Notified 08/03/21  Time Provider Notified [phone]  Notification Type  (text)  Notification Reason Change in status (patient running a temp)  Provider response See new orders  Date of Provider Response 08/03/21  Time of Provider Response 1642  Document  Patient Outcome Other (Comment) (interventions are being  administered)  Progress note created (see row info) Yes  Assess: SIRS CRITERIA  SIRS Temperature  1  SIRS Pulse 1  SIRS Respirations  0  SIRS WBC 0  SIRS Score Sum  2

## 2021-08-03 NOTE — Progress Notes (Addendum)
PROGRESS NOTE    Sydney Turner  OYD:[id number] DOB: 09/18/1941 DOA: 08/22/2021 PCP: Keane Police, MD  Outpatient Specialists: none    Brief Narrative:   From admission hpi:  Sydney Turner is a 80 y.o. female seen in ed with complaints of mental status.  Patient is a resident of a skilled nursing facility where blood work was obtained on was concerning for dehydration.  Per report family members concerned that patient may have a urinary tract infection and has had decreased p.o. intake and urine output for the past few days and has been declining over past few weeks.  Patient at baseline is nonverbal bedridden and does not follow commands.  CODE STATUS is DO NOT RESUSCITATE. HPI /ROS is otherwise unobtainable due to dementia and age. Per daughter mom has advanced dementia and answers yes and no questions. Limited mobility.    Assessment & Plan:   Principal Problem:   AMS (altered mental status) Active Problems:   Hypothyroidism (acquired)   UTI (urinary tract infection)   Hypernatremia  # Dehydration # Acute metabolic encephalopathy # Hypernatremia, chronic # Dysphagia Advanced dementia appears to be culprit. Sodium normalized with IV hydration. Remains somnolent, dysphagia is severe.  - continue fluids for now, family wants to continue to wait and see if there is any improvement. They are now leaning towards hospice  # Advanced dementia I am concerned current presentation is manifestation of advanced dementia, that patient is at end of life. Is followed by palliative as outpt. Hospice has been consulted. Family is considering transition to comfort care and hospice. For now we are treating the treatable. Will touch base w/ hospice/palliative tomorrow.  # Fever This afternoon to 101. No cough. No diarrhea. No skin lesions. Has mild tachycardia. Treated with 3 days ceftriaxone early in admission for poss uti. - I/o cath for urinalysis (last culture multiple species)  and culture - blood cultures x2 - covid pcr - cxr - cbc w/ diff, procalcitonin - will hold on abx pending receipt of above results - tylenol prn  # Pyuria No overt uti symptoms. Urine culture w/ multiple species. S/p 3 days ceftriaxone - see abovee  # Hypokalemia Resolved w/ addition of kcl to fluids - cont fluids w/ kcl - monitor  # Macrocytosis B12 wnl  # Hypothyroidism TSH low, free T4 elevated. On levothyroxine 125 mcg as outpt - hold levothyroxine, repeat TFTs in 4-6 weeks    DVT prophylaxis: lovenox Code Status: dnr Family Communication: daughter sherry updated telephonically 10/16  Level of care: Med-Surg Status is: Inpatient  Remains inpatient appropriate because:Inpatient level of care appropriate due to severity of illness  Dispo: The patient is from: SNF              Anticipated d/c is to: SNF vs inpt hospice              Patient currently is not medically stable to d/c.   Difficult to place patient No        Consultants:  none  Procedures: none  Antimicrobials:  Ceftriaxone 10/12>10/14   Subjective: This morning in bed, not responsive, NAD  Objective: Vitals:   08/03/21 0449 08/03/21 0455 08/03/21 0739 08/03/21 0740  BP: (!) 148/62  (!) 145/96   Pulse: 93  (!) 113 (!) 104  Resp: 18  18   Temp: 98.3 F (36.8 C)  98.8 F (37.1 C)   TempSrc: Oral  Axillary   SpO2: 99%  94% 96%  Weight:  57.2  kg    Height:        Intake/Output Summary (Last 24 hours) at 08/03/2021 1022 Last data filed at 08/03/2021 0740 Gross per 24 hour  Intake 1800 ml  Output 750 ml  Net 1050 ml   Filed Weights   16-Aug-2021 1402 08/02/21 0422 08/03/21 0455  Weight: 59 kg 57.7 kg 57.2 kg    Examination:  General exam: Appears calm and comfortable. Chronically ill appearing  Respiratory system: Clear to auscultation. Respiratory effort normal. Cardiovascular system: S1 & S2 heard, RRR. No JVD, murmurs, rubs, gallops or clicks. No pedal  edema. Gastrointestinal system: Abdomen is nondistended, soft and nontender. No organomegaly or masses felt. Normal bowel sounds heard. Central nervous system: awake. Not communicative Extremities: moves all 4 Skin: No rashes, lesions or ulcers. Sacrum examined Psychiatry: non-verbal, doesn't follow commands.    Data Reviewed: I have personally reviewed following labs and imaging studies  CBC: Recent Labs  Lab 16-Aug-2021 1425  WBC 6.4  HGB 13.2  HCT 40.5  MCV 104.1*  PLT 247   Basic Metabolic Panel: Recent Labs  Lab 08-16-21 1425 07/31/21 0846 07/31/21 1628 08/01/21 0730 08/02/21 0523 08/03/21 0415  NA 157* 155*  --  150* 145 143  K 3.5 3.1*  --  2.9* 3.5 4.1  CL 118* 116*  --  111 110 109  CO2 27 29  --  32 30 29  GLUCOSE 123* 106*  --  111* 116* 108*  BUN 33* 22  --  18 14 12   CREATININE 0.76 0.63  --  0.55 0.63 0.54  CALCIUM 10.3 9.4  --  8.8* 8.7* 8.8*  MG  --   --  1.9  --   --   --    GFR: Estimated Creatinine Clearance: 45.2 mL/min (by C-G formula based on SCr of 0.54 mg/dL). Liver Function Tests: Recent Labs  Lab 08/16/21 1425  AST 45*  ALT 40  ALKPHOS 58  BILITOT 1.2  PROT 7.3  ALBUMIN 3.6   No results for input(s): LIPASE, AMYLASE in the last 168 hours. Recent Labs  Lab 08-16-21 1614  AMMONIA 23   Coagulation Profile: No results for input(s): INR, PROTIME in the last 168 hours. Cardiac Enzymes: No results for input(s): CKTOTAL, CKMB, CKMBINDEX, TROPONINI in the last 168 hours. BNP (last 3 results) No results for input(s): PROBNP in the last 8760 hours. HbA1C: No results for input(s): HGBA1C in the last 72 hours. CBG: No results for input(s): GLUCAP in the last 168 hours. Lipid Profile: No results for input(s): CHOL, HDL, LDLCALC, TRIG, CHOLHDL, LDLDIRECT in the last 72 hours. Thyroid Function Tests: No results for input(s): TSH, T4TOTAL, FREET4, T3FREE, THYROIDAB in the last 72 hours.  Anemia Panel: No results for input(s):  VITAMINB12, FOLATE, FERRITIN, TIBC, IRON, RETICCTPCT in the last 72 hours.  Urine analysis:    Component Value Date/Time   COLORURINE YELLOW (A) 08/16/2021 1530   APPEARANCEUR TURBID (A) 08-16-2021 1530   APPEARANCEUR CLEAR 03/23/2014 1953   LABSPEC 1.015 08/16/21 1530   LABSPEC 1.020 03/23/2014 1953   PHURINE 8.0 2021/08/16 1530   GLUCOSEU NEGATIVE 2021-08-16 1530   GLUCOSEU NEGATIVE 03/23/2014 1953   HGBUR SMALL (A) 08-16-2021 1530   BILIRUBINUR NEGATIVE August 16, 2021 1530   BILIRUBINUR NEGATIVE 03/23/2014 1953   KETONESUR NEGATIVE 2021/08/16 1530   PROTEINUR 100 (A) 2021/08/16 1530   NITRITE NEGATIVE 08/16/2021 1530   LEUKOCYTESUR MODERATE (A) 2021-08-16 1530   LEUKOCYTESUR TRACE 03/23/2014 1953   Sepsis Labs: @LABRCNTIP (procalcitonin:4,lacticidven:4)  )  Recent Results (from the past 240 hour(s))  Resp Panel by RT-PCR (Flu A&B, Covid) Nasopharyngeal Swab     Status: None   Collection Time: 08/07/2021  4:12 PM   Specimen: Nasopharyngeal Swab; Nasopharyngeal(NP) swabs in vial transport medium  Result Value Ref Range Status   SARS Coronavirus 2 by RT PCR NEGATIVE NEGATIVE Final    Comment: (NOTE) SARS-CoV-2 target nucleic acids are NOT DETECTED.  The SARS-CoV-2 RNA is generally detectable in upper respiratory specimens during the acute phase of infection. The lowest concentration of SARS-CoV-2 viral copies this assay can detect is 138 copies/mL. A negative result does not preclude SARS-Cov-2 infection and should not be used as the sole basis for treatment or other patient management decisions. A negative result may occur with  improper specimen collection/handling, submission of specimen other than nasopharyngeal swab, presence of viral mutation(s) within the areas targeted by this assay, and inadequate number of viral copies(<138 copies/mL). A negative result must be combined with clinical observations, patient history, and epidemiological information. The expected result  is Negative.  Fact Sheet for Patients:  BloggerCourse.com  Fact Sheet for Healthcare Providers:  SeriousBroker.it  This test is no t yet approved or cleared by the [place] FDA and  has been authorized for detection and/or diagnosis of SARS-CoV-2 by FDA under an Emergency Use Authorization (EUA). This EUA will remain  in effect (meaning this test can be used) for the duration of the COVID-19 declaration under Section 564(b)(1) of the Act, 21 U.S.C.section 360bbb-3(b)(1), unless the authorization is terminated  or revoked sooner.       Influenza A by PCR NEGATIVE NEGATIVE Final   Influenza B by PCR NEGATIVE NEGATIVE Final    Comment: (NOTE) The Xpert Xpress SARS-CoV-2/FLU/RSV plus assay is intended as an aid in the diagnosis of influenza from Nasopharyngeal swab specimens and should not be used as a sole basis for treatment. Nasal washings and aspirates are unacceptable for Xpert Xpress SARS-CoV-2/FLU/RSV testing.  Fact Sheet for Patients: BloggerCourse.com  Fact Sheet for Healthcare Providers: SeriousBroker.it  This test is not yet approved or cleared by the [place] FDA and has been authorized for detection and/or diagnosis of SARS-CoV-2 by FDA under an Emergency Use Authorization (EUA). This EUA will remain in effect (meaning this test can be used) for the duration of the COVID-19 declaration under Section 564(b)(1) of the Act, 21 U.S.C. section 360bbb-3(b)(1), unless the authorization is terminated or revoked.  Performed at [facility], [address], [place], [place] [address]   Urine Culture     Status: Abnormal   Collection Time: 07/27/2021  5:40 PM   Specimen: Urine, Random  Result Value Ref Range Status   Specimen Description   Final    URINE, RANDOM Performed at [facility], [address], [place], [place] [address]    Special  Requests   Final    NONE Performed at [facility], [address] [place], [place] [address]    Culture MULTIPLE SPECIES PRESENT, SUGGEST RECOLLECTION (A)  Final   Report Status 08/01/2021 FINAL  Final  Blood culture (single)     Status: None (Preliminary result)   Collection Time: 08/03/2021 11:01 PM   Specimen: BLOOD  Result Value Ref Range Status   Specimen Description BLOOD LEFT HAND  Final   Special Requests   Final    BOTTLES DRAWN AEROBIC ONLY Blood Culture results may not be optimal due to an inadequate volume of blood received in culture bottles  Culture   Final    NO GROWTH 4 DAYS Performed at [facility], [address] [place], [place] [address]    Report Status PENDING  Incomplete         Radiology Studies: No results found.      Scheduled Meds:  enoxaparin (LOVENOX) injection  30 mg Subcutaneous Q24H   Continuous Infusions:  dextrose 5 % and 0.45 % NaCl with KCl 40 mEq/L 75 mL/hr at 08/03/21 0740     LOS: 4 days    Time spent: 20 min    Silvano Bilis, MD [facility]   If 7PM-7AM, please contact night-coverage [email or web address] Password [email or web address] 08/03/2021, 10:22 AM

## 2021-08-04 DIAGNOSIS — R4182 Altered mental status, unspecified: Secondary | ICD-10-CM | POA: Diagnosis not present

## 2021-08-04 LAB — BASIC METABOLIC PANEL
Anion gap: 8 (ref 5–15)
BUN: 11 mg/dL (ref 8–23)
CO2: 25 mmol/L (ref 22–32)
Calcium: 8.7 mg/dL — ABNORMAL LOW (ref 8.9–10.3)
Chloride: 105 mmol/L (ref 98–111)
Creatinine, Ser: 0.55 mg/dL (ref 0.44–1.00)
GFR, Estimated: >60 mL/min (ref >60)
Glucose, Bld: 95 mg/dL (ref 70–99)
Potassium: 4.2 mmol/L (ref 3.5–5.1)
Sodium: 138 mmol/L (ref 135–145)

## 2021-08-04 LAB — URINE CULTURE: Culture: NO GROWTH

## 2021-08-04 LAB — CULTURE, BLOOD (SINGLE): Culture: NO GROWTH

## 2021-08-04 MED ORDER — GLYCOPYRROLATE 1 MG PO TABS
1.0000 mg | ORAL_TABLET | ORAL | Status: DC | PRN
  Filled 2021-08-04: qty 1

## 2021-08-04 MED ORDER — BLISTEX MEDICATED EX OINT
TOPICAL_OINTMENT | CUTANEOUS | Status: DC | PRN
  Filled 2021-08-04: qty 6.3

## 2021-08-04 MED ORDER — LORAZEPAM 1 MG PO TABS
1.0000 mg | ORAL_TABLET | ORAL | Status: DC | PRN
  Administered 2021-08-11: 1 mg via ORAL

## 2021-08-04 MED ORDER — LORAZEPAM 2 MG/ML IJ SOLN
1.0000 mg | INTRAMUSCULAR | Status: DC | PRN
  Administered 2021-08-05 – 2021-08-12 (×2): 1 mg via INTRAVENOUS
  Filled 2021-08-04 (×2): qty 1

## 2021-08-04 MED ORDER — GLYCOPYRROLATE 0.2 MG/ML IJ SOLN
0.2000 mg | INTRAMUSCULAR | Status: DC | PRN
  Filled 2021-08-04: qty 1

## 2021-08-04 MED ORDER — LORAZEPAM 2 MG/ML PO CONC
1.0000 mg | ORAL | Status: DC | PRN
  Administered 2021-08-09 – 2021-08-11 (×2): 1 mg via SUBLINGUAL
  Filled 2021-08-04 (×7): qty 0.5

## 2021-08-04 MED ORDER — MORPHINE SULFATE (CONCENTRATE) 10 MG/0.5ML PO SOLN
5.0000 mg | ORAL | Status: DC | PRN
  Filled 2021-08-04: qty 0.5

## 2021-08-04 MED ORDER — BIOTENE DRY MOUTH MT LIQD: 15.0000 mL | OROMUCOSAL | Status: DC | PRN

## 2021-08-04 MED ORDER — GLYCOPYRROLATE 0.2 MG/ML IJ SOLN
0.2000 mg | INTRAMUSCULAR | Status: DC | PRN
  Administered 2021-08-12: 0.2 mg via INTRAVENOUS
  Filled 2021-08-04: qty 1

## 2021-08-04 MED ORDER — MORPHINE SULFATE (CONCENTRATE) 10 MG/0.5ML PO SOLN
5.0000 mg | ORAL | Status: DC | PRN
  Administered 2021-08-05 – 2021-08-09 (×3): 5 mg via SUBLINGUAL
  Filled 2021-08-04 (×3): qty 0.5

## 2021-08-04 MED ORDER — SCOPOLAMINE 1 MG/3DAYS TD PT72
1.0000 | MEDICATED_PATCH | TRANSDERMAL | Status: DC
  Administered 2021-08-04 – 2021-08-10 (×3): 1.5 mg via TRANSDERMAL
  Filled 2021-08-04 (×3): qty 1

## 2021-08-04 NOTE — Progress Notes (Addendum)
[facility] [address] [employer] [facility]) Hospital Liaison Note   Notified that family is interested in [facility]. Spoke with patient's daughter Sydney Turner to confirm interest and explain services. Patient chart and information under review by [facility] physician. Hospice Home eligibility pending at this time.    Unfortunately, Hospice Home is not able to accept patient while under Covid isolation. Family and Amado Nash, RN [facility] Manager aware hospital liaison will continue to follow patient for disposition and determine Hospice Home eligibility once Covid isolation has ended.    Please do not hesitate to call with any hospice related questions.    Thank you for the opportunity to participate in this patient's care.   Bobbie "Einar Gip, RN, BSN [facility] Liaison [phone]

## 2021-08-04 NOTE — Progress Notes (Addendum)
PROGRESS NOTE    Sydney Turner  XHB:[id number] DOB: 1941-09-03 DOA: 08/11/2021 PCP: Keane Police, MD  Outpatient Specialists: none    Brief Narrative:   From admission hpi:  Sydney Turner is a 80 y.o. female seen in ed with complaints of mental status.  Patient is a resident of a skilled nursing facility where blood work was obtained on was concerning for dehydration.  Per report family members concerned that patient may have a urinary tract infection and has had decreased p.o. intake and urine output for the past few days and has been declining over past few weeks.  Patient at baseline is nonverbal bedridden and does not follow commands.  CODE STATUS is DO NOT RESUSCITATE. HPI /ROS is otherwise unobtainable due to dementia and age. Per daughter mom has advanced dementia and answers yes and no questions. Limited mobility.    Assessment & Plan:   Principal Problem:   AMS (altered mental status) Active Problems:   Hypothyroidism (acquired)   UTI (urinary tract infection)   Hypernatremia  # covid-19 infection Symptoms (fever) and positive test on 10/16. No o2 requirement. Day 10 of isolation will be 10/26. - contact/airborne precautions  # Dehydration # Acute metabolic encephalopathy # Hypernatremia, chronic # Dysphagia Advanced dementia appears to be culprit. Sodium normalized with IV hydration. Remains somnolent, dysphagia is severe.  - family elects transition to comfort care today. Will stop ivf  # Oral secretions - scopolamine  # Advanced dementia I am concerned current presentation is manifestation of advanced dementia, that patient is at end of life. Is followed by palliative as outpt. Hospice has been consulted. Transition to comfort care today. Palliative consulted. Hospice following, family requests hospice home, if qualifies will need to serve out covid isolation here first.  # Pyuria No overt uti symptoms. Urine culture w/ multiple species. S/p 3  days ceftriaxone   # Hypokalemia Resolved w/ addition of kcl to fluidsr  # Hypothyroidism TSH low, free T4 elevated. On levothyroxine 125 mcg as outpt - hold levothyroxine    DVT prophylaxis: n/a Code Status: dnr Family Communication: daughters updated @ bedside 10/17  Level of care: Med-Surg Status is: Inpatient  Remains inpatient appropriate because:Inpatient level of care appropriate due to severity of illness  Dispo: The patient is from: SNF              Anticipated d/c is to: SNF vs inpt hospice              Patient currently is not medically stable to d/c.   Difficult to place patient No        Consultants:  none  Procedures: none  Antimicrobials:  Ceftriaxone 10/12>10/14   Subjective: This morning in bed, not responsive, NAD  Objective: Vitals:   08/03/21 1635 08/03/21 1805 08/03/21 2024 08/04/21 0650  BP:   109/73 104/68  Pulse: (!) 105 (!) 106 98 80  Resp:   19 18  Temp: (!) 101.2 F (38.4 C) 98.7 F (37.1 C) 98.8 F (37.1 C) 97.7 F (36.5 C)  TempSrc:  Oral Oral   SpO2:   97% 99%  Weight:      Height:        Intake/Output Summary (Last 24 hours) at 08/04/2021 1019 Last data filed at 08/04/2021 0200 Gross per 24 hour  Intake 1152.5 ml  Output 580 ml  Net 572.5 ml   Filed Weights   08/01/2021 1402 08/02/21 0422 08/03/21 0455  Weight: 59 kg 57.7 kg 57.2 kg  Examination:  General exam: Appears calm and comfortable. Chronically ill appearing  Respiratory system: Clear to auscultation. Respiratory effort normal. Cardiovascular system: S1 & S2 heard, RRR. No JVD, murmurs, rubs, gallops or clicks. No pedal edema. Gastrointestinal system: Abdomen is nondistended, soft and nontender. No organomegaly or masses felt. Normal bowel sounds heard. Central nervous system: awake. Not communicative Extremities: moves all 4 Skin: No rashes, lesions or ulcers. Sacrum examined Psychiatry: non-verbal, doesn't follow commands.    Data Reviewed:  I have personally reviewed following labs and imaging studies  CBC: Recent Labs  Lab 08/14/2021 1425 08/03/21 1713  WBC 6.4 6.3  NEUTROABS  --  5.2  HGB 13.2 11.0*  HCT 40.5 33.1*  MCV 104.1* 98.8  PLT 247 158   Basic Metabolic Panel: Recent Labs  Lab 07/31/21 1628 08/01/21 0730 08/02/21 0523 08/03/21 0415 08/03/21 1713 08/04/21 0449  NA  --  150* 145 143 136 138  K  --  2.9* 3.5 4.1 4.3 4.2  CL  --  111 110 109 103 105  CO2  --  32 30 29 27 25   GLUCOSE  --  111* 116* 108* 116* 95  BUN  --  18 14 12 9 11   CREATININE  --  0.55 0.63 0.54 0.58 0.55  CALCIUM  --  8.8* 8.7* 8.8* 8.8* 8.7*  MG 1.9  --   --   --   --   --    GFR: Estimated Creatinine Clearance: 45.2 mL/min (by C-G formula based on SCr of 0.55 mg/dL). Liver Function Tests: Recent Labs  Lab 08/11/2021 1425 08/03/21 1713  AST 45* 21  ALT 40 16  ALKPHOS 58 58  BILITOT 1.2 0.8  PROT 7.3 6.7  ALBUMIN 3.6 2.9*   No results for input(s): LIPASE, AMYLASE in the last 168 hours. Recent Labs  Lab 07/23/2021 1614  AMMONIA 23   Coagulation Profile: No results for input(s): INR, PROTIME in the last 168 hours. Cardiac Enzymes: No results for input(s): CKTOTAL, CKMB, CKMBINDEX, TROPONINI in the last 168 hours. BNP (last 3 results) No results for input(s): PROBNP in the last 8760 hours. HbA1C: No results for input(s): HGBA1C in the last 72 hours. CBG: No results for input(s): GLUCAP in the last 168 hours. Lipid Profile: No results for input(s): CHOL, HDL, LDLCALC, TRIG, CHOLHDL, LDLDIRECT in the last 72 hours. Thyroid Function Tests: No results for input(s): TSH, T4TOTAL, FREET4, T3FREE, THYROIDAB in the last 72 hours.  Anemia Panel: No results for input(s): VITAMINB12, FOLATE, FERRITIN, TIBC, IRON, RETICCTPCT in the last 72 hours.  Urine analysis:    Component Value Date/Time   COLORURINE YELLOW (A) 08/03/2021 1744   APPEARANCEUR HAZY (A) 08/03/2021 1744   APPEARANCEUR CLEAR 03/23/2014 1953   LABSPEC  1.009 08/03/2021 1744   LABSPEC 1.020 03/23/2014 1953   PHURINE 8.0 08/03/2021 1744   GLUCOSEU NEGATIVE 08/03/2021 1744   GLUCOSEU NEGATIVE 03/23/2014 1953   HGBUR SMALL (A) 08/03/2021 1744   BILIRUBINUR NEGATIVE 08/03/2021 1744   BILIRUBINUR NEGATIVE 03/23/2014 1953   KETONESUR NEGATIVE 08/03/2021 1744   PROTEINUR NEGATIVE 08/03/2021 1744   NITRITE NEGATIVE 08/03/2021 1744   LEUKOCYTESUR MODERATE (A) 08/03/2021 1744   LEUKOCYTESUR TRACE 03/23/2014 1953   Sepsis Labs: @LABRCNTIP (procalcitonin:4,lacticidven:4)  ) Recent Results (from the past 240 hour(s))  Resp Panel by RT-PCR (Flu A&B, Covid) Nasopharyngeal Swab     Status: None   Collection Time: 08/08/2021  4:12 PM   Specimen: Nasopharyngeal Swab; Nasopharyngeal(NP) swabs in vial transport medium  Result  Value Ref Range Status   SARS Coronavirus 2 by RT PCR NEGATIVE NEGATIVE Final    Comment: (NOTE) SARS-CoV-2 target nucleic acids are NOT DETECTED.  The SARS-CoV-2 RNA is generally detectable in upper respiratory specimens during the acute phase of infection. The lowest concentration of SARS-CoV-2 viral copies this assay can detect is 138 copies/mL. A negative result does not preclude SARS-Cov-2 infection and should not be used as the sole basis for treatment or other patient management decisions. A negative result may occur with  improper specimen collection/handling, submission of specimen other than nasopharyngeal swab, presence of viral mutation(s) within the areas targeted by this assay, and inadequate number of viral copies(<138 copies/mL). A negative result must be combined with clinical observations, patient history, and epidemiological information. The expected result is Negative.  Fact Sheet for Patients:  BloggerCourse.com  Fact Sheet for Healthcare Providers:  SeriousBroker.it  This test is no t yet approved or cleared by the [place] FDA and  has been  authorized for detection and/or diagnosis of SARS-CoV-2 by FDA under an Emergency Use Authorization (EUA). This EUA will remain  in effect (meaning this test can be used) for the duration of the COVID-19 declaration under Section 564(b)(1) of the Act, 21 U.S.C.section 360bbb-3(b)(1), unless the authorization is terminated  or revoked sooner.       Influenza A by PCR NEGATIVE NEGATIVE Final   Influenza B by PCR NEGATIVE NEGATIVE Final    Comment: (NOTE) The Xpert Xpress SARS-CoV-2/FLU/RSV plus assay is intended as an aid in the diagnosis of influenza from Nasopharyngeal swab specimens and should not be used as a sole basis for treatment. Nasal washings and aspirates are unacceptable for Xpert Xpress SARS-CoV-2/FLU/RSV testing.  Fact Sheet for Patients: BloggerCourse.com  Fact Sheet for Healthcare Providers: SeriousBroker.it  This test is not yet approved or cleared by the [place] FDA and has been authorized for detection and/or diagnosis of SARS-CoV-2 by FDA under an Emergency Use Authorization (EUA). This EUA will remain in effect (meaning this test can be used) for the duration of the COVID-19 declaration under Section 564(b)(1) of the Act, 21 U.S.C. section 360bbb-3(b)(1), unless the authorization is terminated or revoked.  Performed at [facility], [address], [place], [place] [address]   Urine Culture     Status: Abnormal   Collection Time: 07/28/2021  5:40 PM   Specimen: Urine, Random  Result Value Ref Range Status   Specimen Description   Final    URINE, RANDOM Performed at [facility], [address] [place], [place] [address]    Special Requests   Final    NONE Performed at [facility], [address] [place], [place] [address]    Culture MULTIPLE SPECIES PRESENT, SUGGEST RECOLLECTION (A)  Final   Report Status 08/01/2021 FINAL  Final  Blood culture (single)     Status:  None   Collection Time: 08/06/2021 11:01 PM   Specimen: BLOOD  Result Value Ref Range Status   Specimen Description BLOOD LEFT HAND  Final   Special Requests   Final    BOTTLES DRAWN AEROBIC ONLY Blood Culture results may not be optimal due to an inadequate volume of blood received in culture bottles   Culture   Final    NO GROWTH 5 DAYS Performed at [facility], [address], [place], [place] [address]    Report Status 08/04/2021 FINAL  Final  CULTURE, BLOOD (ROUTINE X 2) w Reflex to ID Panel  Status: None (Preliminary result)   Collection Time: 08/03/21  5:12 PM   Specimen: BLOOD  Result Value Ref Range Status   Specimen Description BLOOD RIGHT HAND  Final   Special Requests   Final    BOTTLES DRAWN AEROBIC AND ANAEROBIC Blood Culture adequate volume   Culture   Final    NO GROWTH < 12 HOURS Performed at [facility], [address], [place], [place] [address]    Report Status PENDING  Incomplete  CULTURE, BLOOD (ROUTINE X 2) w Reflex to ID Panel     Status: None (Preliminary result)   Collection Time: 08/03/21  5:14 PM   Specimen: BLOOD  Result Value Ref Range Status   Specimen Description BLOOD LEFT HAND  Final   Special Requests   Final    BOTTLES DRAWN AEROBIC ONLY Blood Culture results may not be optimal due to an inadequate volume of blood received in culture bottles   Culture   Final    NO GROWTH < 12 HOURS Performed at [facility], [address], [place], [place] [address]    Report Status PENDING  Incomplete  Resp Panel by RT-PCR (Flu A&B, Covid) Nasopharyngeal Swab     Status: Abnormal   Collection Time: 08/03/21  5:42 PM   Specimen: Nasopharyngeal Swab; Nasopharyngeal(NP) swabs in vial transport medium  Result Value Ref Range Status   SARS Coronavirus 2 by RT PCR POSITIVE (A) NEGATIVE Final    Comment: RESULT CALLED TO, READ BACK BY AND VERIFIED WITH: RENEE REID @ 1930 08/03/21 LFD (NOTE) SARS-CoV-2 target nucleic acids are  DETECTED.  The SARS-CoV-2 RNA is generally detectable in upper respiratory specimens during the acute phase of infection. Positive results are indicative of the presence of the identified virus, but do not rule out bacterial infection or co-infection with other pathogens not detected by the test. Clinical correlation with patient history and other diagnostic information is necessary to determine patient infection status. The expected result is Negative.  Fact Sheet for Patients: BloggerCourse.com  Fact Sheet for Healthcare Providers: SeriousBroker.it  This test is not yet approved or cleared by the [place] FDA and  has been authorized for detection and/or diagnosis of SARS-CoV-2 by FDA under an Emergency Use Authorization (EUA).  This EUA will remain in effect (meaning this test can be [place] ed) for the duration of  the COVID-19 declaration under Section 564(b)(1) of the Act, 21 U.S.C. section 360bbb-3(b)(1), unless the authorization is terminated or revoked sooner.     Influenza A by PCR NEGATIVE NEGATIVE Final   Influenza B by PCR NEGATIVE NEGATIVE Final    Comment: (NOTE) The Xpert Xpress SARS-CoV-2/FLU/RSV plus assay is intended as an aid in the diagnosis of influenza from Nasopharyngeal swab specimens and should not be used as a sole basis for treatment. Nasal washings and aspirates are unacceptable for Xpert Xpress SARS-CoV-2/FLU/RSV testing.  Fact Sheet for Patients: BloggerCourse.com  Fact Sheet for Healthcare Providers: SeriousBroker.it  This test is not yet approved or cleared by the [place] FDA and has been authorized for detection and/or diagnosis of SARS-CoV-2 by FDA under an Emergency Use Authorization (EUA). This EUA will remain in effect (meaning this test can be used) for the duration of the COVID-19 declaration under Section 564(b)(1) of the Act, 21  U.S.C. section 360bbb-3(b)(1), unless the authorization is terminated or revoked.  Performed at [facility], [address], [place], [place] [address]          Radiology  Studies: DG Chest Port 1 View  Result Date: 08/03/2021 CLINICAL DATA:  Fevers and altered mental status, initial encounter EXAM: PORTABLE CHEST 1 VIEW COMPARISON:  07/29/2021 FINDINGS: Cardiac shadow is stable. Increasing left basilar infiltrate is noted when compare with the prior exam. The lungs are otherwise clear. No acute bony abnormality is noted. IMPRESSION: Increase in left basilar airspace opacity consistent with infiltrate. Electronically Signed   By: Alcide Clever M.D.   On: 08/03/2021 19:36        Scheduled Meds:  dextromethorphan-guaiFENesin  1 tablet Oral BID   enoxaparin (LOVENOX) injection  40 mg Subcutaneous Q24H   scopolamine  1 patch Transdermal Q72H   Continuous Infusions:  dextrose 5 % and 0.45 % NaCl with KCl 40 mEq/L 75 mL/hr at 08/04/21 0200     LOS: 5 days    Time spent: 20 min    Silvano Bilis, MD [facility]   If 7PM-7AM, please contact night-coverage [email or web address] Password [email or web address] 08/04/2021, 10:19 AM

## 2021-08-04 NOTE — Progress Notes (Signed)
SLP Cancellation Note  Patient Details Name: Sydney Turner MRN: [id number] DOB: 1941/01/19   Cancelled treatment:       Reason Eval/Treat Not Completed: Medical issues which prohibited therapy;Patient's level of consciousness (chart reviewed; consulted NSG re: pt's status). Per NSG report, pt is now not taking any po's, food/drink/meds(being held). Pt has had a decline in status per NSG. Hospice is to see pt and family today for GOC.   ST services will f/u w/ pt's status next 1-2 days. Recommend continue w/ rec'd dysphagia diet w/ aspiration precautions and any oral intake; oral care frequently.      Jerilynn Som, MS, CCC-SLP Speech Language Pathologist Rehab Services [phone] [facility] 08/04/2021, 12:07 PM

## 2021-08-05 DIAGNOSIS — R4182 Altered mental status, unspecified: Secondary | ICD-10-CM | POA: Diagnosis not present

## 2021-08-05 MED ORDER — MORPHINE SULFATE (PF) 2 MG/ML IV SOLN
1.0000 mg | INTRAVENOUS | Status: DC | PRN
  Filled 2021-08-05 (×2): qty 1

## 2021-08-05 NOTE — Progress Notes (Signed)
[facility] [address] [employer] [facility]) Hospital Liaison Note  Spoke with daughter Cordelia Pen outside of patient's room. She states patient is resting comfortably. Cordelia Pen remains aware that once Covid isolation has ended plan is to determine eligibility for Hospice Home.   Please do not hesitate to call with any hospice related questions.  Thank you,   Bobbie "Einar Gip, RN, BSN [facility] Liaison [phone]

## 2021-08-05 NOTE — Progress Notes (Signed)
PROGRESS NOTE    Sydney Turner  RCV:[id number] DOB: Dec 14, 1940 DOA: 08/04/21 PCP: Keane Police, MD  Outpatient Specialists: none    Brief Narrative:   From admission hpi:  Sydney Turner is a 80 y.o. female seen in ed with complaints of mental status.  Patient is a resident of a skilled nursing facility where blood work was obtained on was concerning for dehydration.  Per report family members concerned that patient may have a urinary tract infection and has had decreased p.o. intake and urine output for the past few days and has been declining over past few weeks.  Patient at baseline is nonverbal bedridden and does not follow commands.  CODE STATUS is DO NOT RESUSCITATE. HPI /ROS is otherwise unobtainable due to dementia and age. Per daughter mom has advanced dementia and answers yes and no questions. Limited mobility.    Assessment & Plan:   Principal Problem:   AMS (altered mental status) Active Problems:   Hypothyroidism (acquired)   UTI (urinary tract infection)   Hypernatremia  # covid-19 infection Symptoms (fever) and positive test on 10/16. No o2 requirement. Day 10 of isolation will be 10/26. - contact/airborne precautions  # Dehydration # Acute metabolic encephalopathy # Hypernatremia, chronic # Dysphagia Advanced dementia appears to be culprit. Sodium normalized with IV hydration. Remains somnolent, dysphagia is severe.  - family elects transition to comfort care today. Will stop ivf  # Oral secretions - scopolamine  # Advanced dementia # End-of-life care Hospice/palliative following, transitioned to comfort care 10/17.  # Pyuria No overt uti symptoms. Urine culture w/ multiple species. S/p 3 days ceftriaxone  # Hypokalemia Resolved w/ addition of kcl to fluidsr  # Hypothyroidism TSH low, free T4 elevated. On levothyroxine 125 mcg as outpt - hold levothyroxine    DVT prophylaxis: n/a Code Status: dnr Family Communication: daughter  updated @ bedside 10/18  Level of care: Med-Surg Status is: Inpatient  Remains inpatient appropriate because:Inpatient level of care appropriate due to severity of illness  Dispo: The patient is from: SNF              Anticipated d/c is to: SNF vs inpt hospice              Patient currently is not medically stable to d/c.   Difficult to place patient No        Consultants:  none  Procedures: none  Antimicrobials:  Ceftriaxone 10/12>10/14   Subjective: This morning in bed, not responsive, NAD  Objective: Vitals:   08/04/21 0650 08/04/21 1222 08/04/21 2013 08/05/21 0515  BP: 104/68 (!) 114/101 (!) 109/50 132/88  Pulse: 80 91 93 91  Resp: 18 17 18 16   Temp: 97.7 F (36.5 C) 98.5 F (36.9 C) (!) 97.5 F (36.4 C) 98.5 F (36.9 C)  TempSrc:   Oral Oral  SpO2: 99% 100% 93% 93%  Weight:      Height:       No intake or output data in the 24 hours ending 08/05/21 1315  Filed Weights   08-04-2021 1402 08/02/21 0422 08/03/21 0455  Weight: 59 kg 57.7 kg 57.2 kg    Examination:  General exam: Appears calm and comfortable. Asleep and snoring    Data Reviewed: I have personally reviewed following labs and imaging studies  CBC: Recent Labs  Lab 08-04-2021 1425 08/03/21 1713  WBC 6.4 6.3  NEUTROABS  --  5.2  HGB 13.2 11.0*  HCT 40.5 33.1*  MCV 104.1* 98.8  PLT  247 158   Basic Metabolic Panel: Recent Labs  Lab 07/31/21 1628 08/01/21 0730 08/02/21 0523 08/03/21 0415 08/03/21 1713 08/04/21 0449  NA  --  150* 145 143 136 138  K  --  2.9* 3.5 4.1 4.3 4.2  CL  --  111 110 109 103 105  CO2  --  32 30 29 27 25   GLUCOSE  --  111* 116* 108* 116* 95  BUN  --  18 14 12 9 11   CREATININE  --  0.55 0.63 0.54 0.58 0.55  CALCIUM  --  8.8* 8.7* 8.8* 8.8* 8.7*  MG 1.9  --   --   --   --   --    GFR: Estimated Creatinine Clearance: 45.2 mL/min (by C-G formula based on SCr of 0.55 mg/dL). Liver Function Tests: Recent Labs  Lab 07/28/2021 1425 08/03/21 1713  AST  45* 21  ALT 40 16  ALKPHOS 58 58  BILITOT 1.2 0.8  PROT 7.3 6.7  ALBUMIN 3.6 2.9*   No results for input(s): LIPASE, AMYLASE in the last 168 hours. Recent Labs  Lab 08/02/2021 1614  AMMONIA 23   Coagulation Profile: No results for input(s): INR, PROTIME in the last 168 hours. Cardiac Enzymes: No results for input(s): CKTOTAL, CKMB, CKMBINDEX, TROPONINI in the last 168 hours. BNP (last 3 results) No results for input(s): PROBNP in the last 8760 hours. HbA1C: No results for input(s): HGBA1C in the last 72 hours. CBG: No results for input(s): GLUCAP in the last 168 hours. Lipid Profile: No results for input(s): CHOL, HDL, LDLCALC, TRIG, CHOLHDL, LDLDIRECT in the last 72 hours. Thyroid Function Tests: No results for input(s): TSH, T4TOTAL, FREET4, T3FREE, THYROIDAB in the last 72 hours.  Anemia Panel: No results for input(s): VITAMINB12, FOLATE, FERRITIN, TIBC, IRON, RETICCTPCT in the last 72 hours.  Urine analysis:    Component Value Date/Time   COLORURINE YELLOW (A) 08/03/2021 1744   APPEARANCEUR HAZY (A) 08/03/2021 1744   APPEARANCEUR CLEAR 03/23/2014 1953   LABSPEC 1.009 08/03/2021 1744   LABSPEC 1.020 03/23/2014 1953   PHURINE 8.0 08/03/2021 1744   GLUCOSEU NEGATIVE 08/03/2021 1744   GLUCOSEU NEGATIVE 03/23/2014 1953   HGBUR SMALL (A) 08/03/2021 1744   BILIRUBINUR NEGATIVE 08/03/2021 1744   BILIRUBINUR NEGATIVE 03/23/2014 1953   KETONESUR NEGATIVE 08/03/2021 1744   PROTEINUR NEGATIVE 08/03/2021 1744   NITRITE NEGATIVE 08/03/2021 1744   LEUKOCYTESUR MODERATE (A) 08/03/2021 1744   LEUKOCYTESUR TRACE 03/23/2014 1953   Sepsis Labs: @LABRCNTIP (procalcitonin:4,lacticidven:4)  ) Recent Results (from the past 240 hour(s))  Resp Panel by RT-PCR (Flu A&B, Covid) Nasopharyngeal Swab     Status: None   Collection Time: 07/28/2021  4:12 PM   Specimen: Nasopharyngeal Swab; Nasopharyngeal(NP) swabs in vial transport medium  Result Value Ref Range Status   SARS Coronavirus 2  by RT PCR NEGATIVE NEGATIVE Final    Comment: (NOTE) SARS-CoV-2 target nucleic acids are NOT DETECTED.  The SARS-CoV-2 RNA is generally detectable in upper respiratory specimens during the acute phase of infection. The lowest concentration of SARS-CoV-2 viral copies this assay can detect is 138 copies/mL. A negative result does not preclude SARS-Cov-2 infection and should not be used as the sole basis for treatment or other patient management decisions. A negative result may occur with  improper specimen collection/handling, submission of specimen other than nasopharyngeal swab, presence of viral mutation(s) within the areas targeted by this assay, and inadequate number of viral copies(<138 copies/mL). A negative result must be combined with clinical  observations, patient history, and epidemiological information. The expected result is Negative.  Fact Sheet for Patients:  BloggerCourse.com  Fact Sheet for Healthcare Providers:  SeriousBroker.it  This test is no t yet approved or cleared by the [place] FDA and  has been authorized for detection and/or diagnosis of SARS-CoV-2 by FDA under an Emergency Use Authorization (EUA). This EUA will remain  in effect (meaning this test can be used) for the duration of the COVID-19 declaration under Section 564(b)(1) of the Act, 21 U.S.C.section 360bbb-3(b)(1), unless the authorization is terminated  or revoked sooner.       Influenza A by PCR NEGATIVE NEGATIVE Final   Influenza B by PCR NEGATIVE NEGATIVE Final    Comment: (NOTE) The Xpert Xpress SARS-CoV-2/FLU/RSV plus assay is intended as an aid in the diagnosis of influenza from Nasopharyngeal swab specimens and should not be used as a sole basis for treatment. Nasal washings and aspirates are unacceptable for Xpert Xpress SARS-CoV-2/FLU/RSV testing.  Fact Sheet for Patients: BloggerCourse.com  Fact  Sheet for Healthcare Providers: SeriousBroker.it  This test is not yet approved or cleared by the [place] FDA and has been authorized for detection and/or diagnosis of SARS-CoV-2 by FDA under an Emergency Use Authorization (EUA). This EUA will remain in effect (meaning this test can be used) for the duration of the COVID-19 declaration under Section 564(b)(1) of the Act, 21 U.S.C. section 360bbb-3(b)(1), unless the authorization is terminated or revoked.  Performed at [facility], [address] [place], [place] [address]   Urine Culture     Status: Abnormal   Collection Time: 08/24/21  5:40 PM   Specimen: Urine, Random  Result Value Ref Range Status   Specimen Description   Final    URINE, RANDOM Performed at [facility], [address], [place], [place] [address]    Special Requests   Final    NONE Performed at [facility], [address] [place], [place] [address]    Culture MULTIPLE SPECIES PRESENT, SUGGEST RECOLLECTION (A)  Final   Report Status 08/01/2021 FINAL  Final  Blood culture (single)     Status: None   Collection Time: 2021-08-24 11:01 PM   Specimen: BLOOD  Result Value Ref Range Status   Specimen Description BLOOD LEFT HAND  Final   Special Requests   Final    BOTTLES DRAWN AEROBIC ONLY Blood Culture results may not be optimal due to an inadequate volume of blood received in culture bottles   Culture   Final    NO GROWTH 5 DAYS Performed at [facility], [address] [place], [place] [address]    Report Status 08/04/2021 FINAL  Final  CULTURE, BLOOD (ROUTINE X 2) w Reflex to ID Panel     Status: None (Preliminary result)   Collection Time: 08/03/21  5:12 PM   Specimen: BLOOD  Result Value Ref Range Status   Specimen Description BLOOD RIGHT HAND  Final   Special Requests   Final    BOTTLES DRAWN AEROBIC AND ANAEROBIC Blood Culture adequate volume   Culture   Final    NO GROWTH 2  DAYS Performed at [facility], [address] [place], [place] [address]    Report Status PENDING  Incomplete  CULTURE, BLOOD (ROUTINE X 2) w Reflex to ID Panel     Status: None (Preliminary result)   Collection Time: 08/03/21  5:14 PM   Specimen: BLOOD  Result Value Ref Range Status   Specimen  Description BLOOD LEFT HAND  Final   Special Requests   Final    BOTTLES DRAWN AEROBIC ONLY Blood Culture results may not be optimal due to an inadequate volume of blood received in culture bottles   Culture   Final    NO GROWTH 2 DAYS Performed at [facility], [address] [place], [place] [address]    Report Status PENDING  Incomplete  Resp Panel by RT-PCR (Flu A&B, Covid) Nasopharyngeal Swab     Status: Abnormal   Collection Time: 08/03/21  5:42 PM   Specimen: Nasopharyngeal Swab; Nasopharyngeal(NP) swabs in vial transport medium  Result Value Ref Range Status   SARS Coronavirus 2 by RT PCR POSITIVE (A) NEGATIVE Final    Comment: RESULT CALLED TO, READ BACK BY AND VERIFIED WITH: RENEE REID @ 1930 08/03/21 LFD (NOTE) SARS-CoV-2 target nucleic acids are DETECTED.  The SARS-CoV-2 RNA is generally detectable in upper respiratory specimens during the acute phase of infection. Positive results are indicative of the presence of the identified virus, but do not rule out bacterial infection or co-infection with other pathogens not detected by the test. Clinical correlation with patient history and other diagnostic information is necessary to determine patient infection status. The expected result is Negative.  Fact Sheet for Patients: BloggerCourse.com  Fact Sheet for Healthcare Providers: SeriousBroker.it  This test is not yet approved or cleared by the [place] FDA and  has been authorized for detection and/or diagnosis of SARS-CoV-2 by FDA under an Emergency Use Authorization (EUA).  This EUA will remain in  effect (meaning this test can be [place] ed) for the duration of  the COVID-19 declaration under Section 564(b)(1) of the Act, 21 U.S.C. section 360bbb-3(b)(1), unless the authorization is terminated or revoked sooner.     Influenza A by PCR NEGATIVE NEGATIVE Final   Influenza B by PCR NEGATIVE NEGATIVE Final    Comment: (NOTE) The Xpert Xpress SARS-CoV-2/FLU/RSV plus assay is intended as an aid in the diagnosis of influenza from Nasopharyngeal swab specimens and should not be used as a sole basis for treatment. Nasal washings and aspirates are unacceptable for Xpert Xpress SARS-CoV-2/FLU/RSV testing.  Fact Sheet for Patients: BloggerCourse.com  Fact Sheet for Healthcare Providers: SeriousBroker.it  This test is not yet approved or cleared by the [place] FDA and has been authorized for detection and/or diagnosis of SARS-CoV-2 by FDA under an Emergency Use Authorization (EUA). This EUA will remain in effect (meaning this test can be used) for the duration of the COVID-19 declaration under Section 564(b)(1) of the Act, 21 U.S.C. section 360bbb-3(b)(1), unless the authorization is terminated or revoked.  Performed at [facility], [address] [place], [place] [address]   Urine Culture     Status: None   Collection Time: 08/03/21  5:44 PM   Specimen: Urine, Random  Result Value Ref Range Status   Specimen Description   Final    URINE, RANDOM Performed at [facility], [address] [place], [place] [address]    Special Requests   Final    NONE Performed at [facility], [address], [place], [place] [address]    Culture   Final    NO GROWTH Performed at [facility], [address], [place], [place] [address]    Report Status 08/04/2021 FINAL  Final         Radiology Studies: DG Chest Port 1 View  Result Date: 08/03/2021 CLINICAL DATA:  Fevers and altered  mental  status, initial encounter EXAM: PORTABLE CHEST 1 VIEW COMPARISON:  22-Aug-2021 FINDINGS: Cardiac shadow is stable. Increasing left basilar infiltrate is noted when compare with the prior exam. The lungs are otherwise clear. No acute bony abnormality is noted. IMPRESSION: Increase in left basilar airspace opacity consistent with infiltrate. Electronically Signed   By: Alcide Clever M.D.   On: 08/03/2021 19:36        Scheduled Meds:  scopolamine  1 patch Transdermal Q72H   Continuous Infusions:     LOS: 6 days    Time spent: 20 min    Silvano Bilis, MD [facility]   If 7PM-7AM, please contact night-coverage [email or web address] Password [email or web address] 08/05/2021, 1:15 PM

## 2021-08-05 NOTE — Care Management Important Message (Signed)
Important Message  Patient Details  Name: Sydney Turner MRN: [id number] Date of Birth: 1941/10/11   Medicare Important Message Given:  Other (see comment)  Patient is on comfort care awaiting Hospice Home bed once cleared of COVID. Out of respect for the patient and family no Important Message from [facility] given.  Olegario Messier A Oiva Dibari 08/05/2021, 8:23 AM

## 2021-08-05 NOTE — TOC Progression Note (Signed)
Transition of Care [facility]) - Progression Note    Patient Details  Name: [name] MRN: [id number] Date of Birth: 08/04/1941  [facility] CM/SW Contact  Caryn Section, RN Phone Number: 08/05/2021, 2:59 PM  Clinical Narrative:  Patient is currently waiting for [facility], but tested positive for COVID on 16 Oct, thus will not be ready for transfer until 27 Oct, when quarantine is over.  [facility] is following this patient, TOC contact information provided.      Expected Discharge Plan: Skilled Nursing Facility Barriers to Discharge: Continued Medical Work up  Expected Discharge Plan and Services Expected Discharge Plan: Skilled Nursing Facility   Discharge Planning Services: CM Consult Post Acute Care Choice: Skilled Nursing Facility Living arrangements for the past 2 months: Skilled Nursing Facility                 DME Arranged:  (n/a patient has equipment in facility)         HH Arranged:  (n/a facility resident)           Social Determinants of Health (SDOH) Interventions    Readmission Risk Interventions No flowsheet data found.

## 2021-08-05 NOTE — Plan of Care (Signed)
  Problem: Skin Integrity: Goal: Risk for impaired skin integrity will decrease Outcome: Progressing   Problem: Activity: Goal: Risk for activity intolerance will decrease Outcome: Not Progressing   

## 2021-08-06 DIAGNOSIS — E870 Hyperosmolality and hypernatremia: Secondary | ICD-10-CM | POA: Diagnosis not present

## 2021-08-06 DIAGNOSIS — R4182 Altered mental status, unspecified: Secondary | ICD-10-CM | POA: Diagnosis not present

## 2021-08-06 NOTE — Progress Notes (Signed)
PROGRESS NOTE    ABBIGAIL Turner   GBT:[id number]  DOB: [birth year]  PCP: Keane Police, MD    DOA: 08/15/2021 LOS: 7    Brief Narrative / Hospital Course to Date:   From admission HPI: "Sydney Turner is a 80 y.o. female seen in ed with complaints of mental status.  Patient is a resident of a skilled nursing facility where blood work was obtained on was concerning for dehydration.  Per report family members concerned that patient may have a urinary tract infection and has had decreased p.o. intake and urine output for the past few days and has been declining over past few weeks.  Patient at baseline is nonverbal bedridden and does not follow commands.  CODE STATUS is DO NOT RESUSCITATE. HPI /ROS is otherwise unobtainable due to dementia and age. Per daughter mom has advanced dementia and answers yes and no questions. Limited mobility."  Assessment & Plan   Principal Problem:   AMS (altered mental status) Active Problems:   Hypothyroidism (acquired)   UTI (urinary tract infection)   Hypernatremia   Comfort Care Measures - transitioned to comfort status on 10/17. Advanced dementia End-of-life care Hospice/palliative following, transitioned to comfort care 10/17. Comfort care per orders. Family request to be contacted before any morphine, ativan or other sedating meds are give. Try Tylenol suppository for signs of pain prior to using morphine Notify MD/DO if signs of pain, distress or discomfort.   Covid-19 infection - Symptoms (fever) and positive test on 10/16.  No oxygen requirement.  Day 10 of isolation will be 10/26. Contact/airborne precautions.   Dehydration Acute metabolic encephalopathy Hypernatremia, chronic Dysphagia Advanced dementia appears to be cause of above issues.  Sodium normalized with IV hydration.  Remains somnolent, dysphagia is severe.  IV fluids have been stopped as per comfort care status   Oral secretions - scopolamine patch.   Improved.     Pyuria - No overt UTI symptoms.  Urine culture w/ multiple species.  Treated with 3 days ceftriaxone.   Hypokalemia - Resolved w/ addition of kcl to fluids   Hypothyroidism - TSH low, free T4 elevated.  On levothyroxine 125 mcg as outpt Hold levothyroxine per comfort care status.  Patient BMI: Body mass index is 24.63 kg/m.   DVT prophylaxis:    Diet:  Diet Orders (From admission, onward)     Start     Ordered   08/01/21 1004  DIET - DYS 1 Room service appropriate? Yes with Assist; Fluid consistency: Nectar Thick  Diet effective now       Comments: Extra Gravy on meats, potatoes. Butters. Yogurt and puddings and Magic Cup TID meals. NO STRAWS!! Extra Spoon.  Question Answer Comment  Room service appropriate? Yes with Assist   Fluid consistency: Nectar Thick      08/01/21 1006              Code Status: DNR   Subjective 08/06/21    Patient seen awake sitting up in bed with 3 daughters at bedside today.  She has reportedly appeared comfortable without signs of pain or distress.  She was reportedly very somnolent yesterday after being given morphine.  Daughter's request being contacted before this or any other sedating medications are given and asked that we try Tylenol suppositories first.  They do not want patient to be sedated so that they are able to enjoy time with her.   Disposition Plan & Communication   Status is: Inpatient  Remains inpatient appropriate because:  On comfort care measures, on airborne and contact precautions for COVID infection.  Anticipate discharge with hospice if hemodynamically stable once off precautions   Family Communication: Daughters at bedside on rounds   Consults, Procedures, Significant Events   Consultants:  Palliative care Hospice  Procedures:  None  Antimicrobials:  Anti-infectives (From admission, onward)    Start     Dose/Rate Route Frequency Ordered Stop   07/31/21 2000  cefTRIAXone (ROCEPHIN) 1 g in  sodium chloride 0.9 % 100 mL IVPB  Status:  Discontinued        1 g 200 mL/hr over 30 Minutes Intravenous Every 24 hours 2021-08-09 2016 08/01/21 1139   August 09, 2021 1830  cefTRIAXone (ROCEPHIN) 1 g in sodium chloride 0.9 % 100 mL IVPB        1 g 200 mL/hr over 30 Minutes Intravenous  Once 09-Aug-2021 1822 09-Aug-2021 1959         Micro    Objective   Vitals:   08/04/21 1222 08/04/21 2013 08/05/21 0515 08/06/21 0611  BP: (!) 114/101 (!) 109/50 132/88 104/65  Pulse: 91 93 91 (!) 111  Resp: 17 18 16    Temp: 98.5 F (36.9 C) (!) 97.5 F (36.4 C) 98.5 F (36.9 C) 98.4 F (36.9 C)  TempSrc:  Oral Oral Oral  SpO2: 100% 93% 93% 97%  Weight:      Height:        Intake/Output Summary (Last 24 hours) at 08/06/2021 1542 Last data filed at 08/06/2021 1300 Gross per 24 hour  Intake --  Output 750 ml  Net -750 ml   Filed Weights   2021-08-09 1402 08/02/21 0422 08/03/21 0455  Weight: 59 kg 57.7 kg 57.2 kg    Physical Exam:  General exam: awake, alert, no acute distress, mostly nonverbal HEENT: atraumatic, clear conjunctiva, anicteric sclera, moist mucus membranes, hearing grossly normal  Respiratory system: CTAB, no wheezes, rales or rhonchi, normal respiratory effort. Cardiovascular system: normal S1/S2, RRR, no pedal edema.   Gastrointestinal system: soft, NT, ND, no HSM felt, +bowel sounds. Central nervous system: no gross focal neurologic deficits, full neuro exam limited by patient's inability to follow commands due to dementia Skin: dry, intact, normal temperature, normal color, No rashes, lesions or ulcers seen on visualized skin Psychiatry: normal mood, congruent affect, abnormal judgment and insight due to dementia, nonverbal  Labs   Data Reviewed: I have personally reviewed following labs and imaging studies  CBC: Recent Labs  Lab 08/03/21 1713  WBC 6.3  NEUTROABS 5.2  HGB 11.0*  HCT 33.1*  MCV 98.8  PLT 158   Basic Metabolic Panel: Recent Labs  Lab 07/31/21 1628  08/01/21 0730 08/02/21 0523 08/03/21 0415 08/03/21 1713 08/04/21 0449  NA  --  150* 145 143 136 138  K  --  2.9* 3.5 4.1 4.3 4.2  CL  --  111 110 109 103 105  CO2  --  32 30 29 27 25   GLUCOSE  --  111* 116* 108* 116* 95  BUN  --  18 14 12 9 11   CREATININE  --  0.55 0.63 0.54 0.58 0.55  CALCIUM  --  8.8* 8.7* 8.8* 8.8* 8.7*  MG 1.9  --   --   --   --   --    GFR: Estimated Creatinine Clearance: 45.2 mL/min (by C-G formula based on SCr of 0.55 mg/dL). Liver Function Tests: Recent Labs  Lab 08/03/21 1713  AST 21  ALT 16  ALKPHOS 58  BILITOT  0.8  PROT 6.7  ALBUMIN 2.9*   No results for input(s): LIPASE, AMYLASE in the last 168 hours. Recent Labs  Lab Aug 24, 2021 1614  AMMONIA 23   Coagulation Profile: No results for input(s): INR, PROTIME in the last 168 hours. Cardiac Enzymes: No results for input(s): CKTOTAL, CKMB, CKMBINDEX, TROPONINI in the last 168 hours. BNP (last 3 results) No results for input(s): PROBNP in the last 8760 hours. HbA1C: No results for input(s): HGBA1C in the last 72 hours. CBG: No results for input(s): GLUCAP in the last 168 hours. Lipid Profile: No results for input(s): CHOL, HDL, LDLCALC, TRIG, CHOLHDL, LDLDIRECT in the last 72 hours. Thyroid Function Tests: No results for input(s): TSH, T4TOTAL, FREET4, T3FREE, THYROIDAB in the last 72 hours. Anemia Panel: No results for input(s): VITAMINB12, FOLATE, FERRITIN, TIBC, IRON, RETICCTPCT in the last 72 hours. Sepsis Labs: Recent Labs  Lab Aug 24, 2021 1614 08/24/21 2301 07/31/21 0846 08/03/21 1713  PROCALCITON  --   --   --  <0.10  LATICACIDVEN 1.4 2.0* 1.3  --     Recent Results (from the past 240 hour(s))  Resp Panel by RT-PCR (Flu A&B, Covid) Nasopharyngeal Swab     Status: None   Collection Time: 24-Aug-2021  4:12 PM   Specimen: Nasopharyngeal Swab; Nasopharyngeal(NP) swabs in vial transport medium  Result Value Ref Range Status   SARS Coronavirus 2 by RT PCR NEGATIVE NEGATIVE Final     Comment: (NOTE) SARS-CoV-2 target nucleic acids are NOT DETECTED.  The SARS-CoV-2 RNA is generally detectable in upper respiratory specimens during the acute phase of infection. The lowest concentration of SARS-CoV-2 viral copies this assay can detect is 138 copies/mL. A negative result does not preclude SARS-Cov-2 infection and should not be used as the sole basis for treatment or other patient management decisions. A negative result may occur with  improper specimen collection/handling, submission of specimen other than nasopharyngeal swab, presence of viral mutation(s) within the areas targeted by this assay, and inadequate number of viral copies(<138 copies/mL). A negative result must be combined with clinical observations, patient history, and epidemiological information. The expected result is Negative.  Fact Sheet for Patients:  BloggerCourse.com  Fact Sheet for Healthcare Providers:  SeriousBroker.it  This test is no t yet approved or cleared by the [place] FDA and  has been authorized for detection and/or diagnosis of SARS-CoV-2 by FDA under an Emergency Use Authorization (EUA). This EUA will remain  in effect (meaning this test can be used) for the duration of the COVID-19 declaration under Section 564(b)(1) of the Act, 21 U.S.C.section 360bbb-3(b)(1), unless the authorization is terminated  or revoked sooner.       Influenza A by PCR NEGATIVE NEGATIVE Final   Influenza B by PCR NEGATIVE NEGATIVE Final    Comment: (NOTE) The Xpert Xpress SARS-CoV-2/FLU/RSV plus assay is intended as an aid in the diagnosis of influenza from Nasopharyngeal swab specimens and should not be used as a sole basis for treatment. Nasal washings and aspirates are unacceptable for Xpert Xpress SARS-CoV-2/FLU/RSV testing.  Fact Sheet for Patients: BloggerCourse.com  Fact Sheet for Healthcare  Providers: SeriousBroker.it  This test is not yet approved or cleared by the [place] FDA and has been authorized for detection and/or diagnosis of SARS-CoV-2 by FDA under an Emergency Use Authorization (EUA). This EUA will remain in effect (meaning this test can be used) for the duration of the COVID-19 declaration under Section 564(b)(1) of the Act, 21 U.S.C. section 360bbb-3(b)(1), unless the authorization is terminated  or revoked.  Performed at [facility], [address], [place], [place] [address]   Urine Culture     Status: Abnormal   Collection Time: 08/08/21  5:40 PM   Specimen: Urine, Random  Result Value Ref Range Status   Specimen Description   Final    URINE, RANDOM Performed at [facility], [address] [place], [place] [address]    Special Requests   Final    NONE Performed at [facility], [address] [place], [place] [address]    Culture MULTIPLE SPECIES PRESENT, SUGGEST RECOLLECTION (A)  Final   Report Status 08/01/2021 FINAL  Final  Blood culture (single)     Status: None   Collection Time: 08/08/2021 11:01 PM   Specimen: BLOOD  Result Value Ref Range Status   Specimen Description BLOOD LEFT HAND  Final   Special Requests   Final    BOTTLES DRAWN AEROBIC ONLY Blood Culture results may not be optimal due to an inadequate volume of blood received in culture bottles   Culture   Final    NO GROWTH 5 DAYS Performed at [facility], [address] [place], [place] [address]    Report Status 08/04/2021 FINAL  Final  CULTURE, BLOOD (ROUTINE X 2) w Reflex to ID Panel     Status: None (Preliminary result)   Collection Time: 08/03/21  5:12 PM   Specimen: BLOOD  Result Value Ref Range Status   Specimen Description BLOOD RIGHT HAND  Final   Special Requests   Final    BOTTLES DRAWN AEROBIC AND ANAEROBIC Blood Culture adequate volume   Culture   Final    NO GROWTH 3 DAYS Performed at  [facility], [address] [place], [place] [address]    Report Status PENDING  Incomplete  CULTURE, BLOOD (ROUTINE X 2) w Reflex to ID Panel     Status: None (Preliminary result)   Collection Time: 08/03/21  5:14 PM   Specimen: BLOOD  Result Value Ref Range Status   Specimen Description BLOOD LEFT HAND  Final   Special Requests   Final    BOTTLES DRAWN AEROBIC ONLY Blood Culture results may not be optimal due to an inadequate volume of blood received in culture bottles   Culture   Final    NO GROWTH 3 DAYS Performed at [facility], [address], [place], [place] [address]    Report Status PENDING  Incomplete  Resp Panel by RT-PCR (Flu A&B, Covid) Nasopharyngeal Swab     Status: Abnormal   Collection Time: 08/03/21  5:42 PM   Specimen: Nasopharyngeal Swab; Nasopharyngeal(NP) swabs in vial transport medium  Result Value Ref Range Status   SARS Coronavirus 2 by RT PCR POSITIVE (A) NEGATIVE Final    Comment: RESULT CALLED TO, READ BACK BY AND VERIFIED WITH: RENEE REID @ 1930 08/03/21 LFD (NOTE) SARS-CoV-2 target nucleic acids are DETECTED.  The SARS-CoV-2 RNA is generally detectable in upper respiratory specimens during the acute phase of infection. Positive results are indicative of the presence of the identified virus, but do not rule out bacterial infection or co-infection with other pathogens not detected by the test. Clinical correlation with patient history and other diagnostic information is necessary to determine patient infection status. The expected result is Negative.  Fact Sheet for Patients: BloggerCourse.com  Fact Sheet for Healthcare Providers: SeriousBroker.it  This test is not yet approved or cleared by the [place] FDA and  has been  authorized for detection and/or diagnosis of SARS-CoV-2 by FDA under an Emergency Use Authorization (EUA).  This EUA will remain in effect (meaning  this test can be [place] ed) for the duration of  the COVID-19 declaration under Section 564(b)(1) of the Act, 21 U.S.C. section 360bbb-3(b)(1), unless the authorization is terminated or revoked sooner.     Influenza A by PCR NEGATIVE NEGATIVE Final   Influenza B by PCR NEGATIVE NEGATIVE Final    Comment: (NOTE) The Xpert Xpress SARS-CoV-2/FLU/RSV plus assay is intended as an aid in the diagnosis of influenza from Nasopharyngeal swab specimens and should not be used as a sole basis for treatment. Nasal washings and aspirates are unacceptable for Xpert Xpress SARS-CoV-2/FLU/RSV testing.  Fact Sheet for Patients: BloggerCourse.com  Fact Sheet for Healthcare Providers: SeriousBroker.it  This test is not yet approved or cleared by the [place] FDA and has been authorized for detection and/or diagnosis of SARS-CoV-2 by FDA under an Emergency Use Authorization (EUA). This EUA will remain in effect (meaning this test can be used) for the duration of the COVID-19 declaration under Section 564(b)(1) of the Act, 21 U.S.C. section 360bbb-3(b)(1), unless the authorization is terminated or revoked.  Performed at [facility], [address], [place], [place] [address]   Urine Culture     Status: None   Collection Time: 08/03/21  5:44 PM   Specimen: Urine, Random  Result Value Ref Range Status   Specimen Description   Final    URINE, RANDOM Performed at [facility], [address], [place], [place] [address]    Special Requests   Final    NONE Performed at [facility], [address], [place], [place] [address]    Culture   Final    NO GROWTH Performed at [facility], [address], [place], [place] [address]    Report Status 08/04/2021 FINAL  Final      Imaging Studies   No results found.   Medications   Scheduled Meds:  scopolamine  1 patch Transdermal Q72H   Continuous  Infusions:     LOS: 7 days    Time spent: 30 minutes    Pennie Banter, DO [facility]  08/06/2021, 3:42 PM      If 7PM-7AM, please contact night-coverage. How to contact the [facility] Attending or Consulting provider 7A - 7P or covering provider during after hours 7P -7A, for this patient?    Check the care team in [facility] and look for a) attending/consulting TRH provider listed and b) the [facility] team listed Log into [email or web address] and use [place]'s universal password to access. If you do not have the password, please contact the hospital operator. Locate the [facility] provider you are looking for under [facility] and page to a number that you can be directly reached. If you still have difficulty reaching the provider, please page the [facility] (Director on Call) for the Hospitalists listed on amion for assistance.

## 2021-08-06 NOTE — Progress Notes (Signed)
Daughter and family here visiting pt, given another update, pt comfortable, family at bedside

## 2021-08-06 NOTE — Progress Notes (Signed)
Daughter [name] given a daily update, states that she will be here soon to visit

## 2021-08-06 NOTE — Plan of Care (Signed)
PMT note:  Consult noted for GOC. It appears patient has been working with [facility] during the hospitalization. It appears as of 10/18 she is currently awaiting a bed at the hospice facility. She has medications in place for comfort. As goals, plans, and medications are set, please reconsult if needed.

## 2021-08-06 NOTE — Progress Notes (Signed)
SLP F/U Note  Patient Details Name: Sydney Turner MRN: [id number] DOB: 1941/06/18   Cancelled treatment:       Reason Eval/Treat Not Completed:  (chart reviewed; pt is awaiting transfer to Hospice Home per chart). Pt is currently awaiting a bed at the hospice facility per Palliative Care note; she has medications in place for comfort also. As goals and plans are set, no further skilled ST services indicated. NSG to reconsult if new needs arise while admitted.      Jerilynn Som, MS, CCC-SLP Speech Language Pathologist Rehab Services [phone] [facility] 08/06/2021, 11:28 AM

## 2021-08-07 DIAGNOSIS — R4182 Altered mental status, unspecified: Secondary | ICD-10-CM | POA: Diagnosis not present

## 2021-08-07 DIAGNOSIS — E870 Hyperosmolality and hypernatremia: Secondary | ICD-10-CM | POA: Diagnosis not present

## 2021-08-07 NOTE — Progress Notes (Signed)
[facility] [address] [employer] [facility]) Hospital Liaison Note  Hospital liaison unable to visit at bedside due to Covid isolation. Plan remains for re evaluation for Hospice Home once Covid isolation has ended.   Hospital liaison will continue to follow through disposition.  Please do not hesitate to call with any hospice related questions.  Thank you,  Bobbie "Einar Gip, RN, BSN [facility] Liaison [phone]

## 2021-08-07 NOTE — Progress Notes (Signed)
PROGRESS NOTE    Sydney Turner   ZOX:[id number]  DOB: [birth year]  PCP: Keane Police, MD    DOA: 08/07/2021 LOS: 8    Brief Narrative / Hospital Course to Date:   From admission HPI: "Sydney Turner is a 80 y.o. female seen in ed with complaints of mental status.  Patient is a resident of a skilled nursing facility where blood work was obtained on was concerning for dehydration.  Per report family members concerned that patient may have a urinary tract infection and has had decreased p.o. intake and urine output for the past few days and has been declining over past few weeks.  Patient at baseline is nonverbal bedridden and does not follow commands.  CODE STATUS is DO NOT RESUSCITATE. HPI /ROS is otherwise unobtainable due to dementia and age. Per daughter mom has advanced dementia and answers yes and no questions. Limited mobility."  Assessment & Plan   Principal Problem:   AMS (altered mental status) Active Problems:   Hypothyroidism (acquired)   UTI (urinary tract infection)   Hypernatremia   Comfort Care Measures - transitioned to comfort status on 10/17. Advanced dementia End-of-life care Hospice/palliative following, transitioned to comfort care 10/17. Comfort care per orders. Family request to be contacted before any morphine, ativan or other sedating meds are give. Try Tylenol suppository for signs of pain prior to using morphine Notify MD/DO if signs of pain, distress or discomfort.   Covid-19 infection - Symptoms (fever) and positive test on 10/16.  No oxygen requirement.  Day 10 of isolation will be 10/26. Contact/airborne precautions.   Dehydration Acute metabolic encephalopathy Hypernatremia, chronic Dysphagia Advanced dementia appears to be cause of above issues.  Sodium normalized with IV hydration.  Remains somnolent, dysphagia is severe.  IV fluids have been stopped as per comfort care status   Oral secretions - scopolamine patch.   Improved.     Pyuria - No overt UTI symptoms.  Urine culture w/ multiple species.  Treated with 3 days ceftriaxone.   Hypokalemia - Resolved w/ addition of kcl to fluids   Hypothyroidism - TSH low, free T4 elevated.  On levothyroxine 125 mcg as outpt Hold levothyroxine per comfort care status.  Patient BMI: Body mass index is 24.63 kg/m.   DVT prophylaxis:    Diet:  Diet Orders (From admission, onward)     Start     Ordered   08/01/21 1004  DIET - DYS 1 Room service appropriate? Yes with Assist; Fluid consistency: Nectar Thick  Diet effective now       Comments: Extra Gravy on meats, potatoes. Butters. Yogurt and puddings and Magic Cup TID meals. NO STRAWS!! Extra Spoon.  Question Answer Comment  Room service appropriate? Yes with Assist   Fluid consistency: Nectar Thick      08/01/21 1006              Code Status: DNR   Subjective 08/07/21    Patient awake resting on her side in the bed when seen today no family members present at the time.  No acute events reported overnight.  Patient mostly nonverbal but shakes head no when asked if any pain.   Disposition Plan & Communication   Status is: Inpatient  Remains inpatient appropriate because: On comfort care measures, on airborne and contact precautions for COVID infection.  Anticipate discharge with hospice if hemodynamically stable once off precautions   Family Communication: Daughters at bedside on rounds 10/19   Consults, Procedures, Significant Events  Consultants:  Palliative care Hospice  Procedures:  None  Antimicrobials:  Anti-infectives (From admission, onward)    Start     Dose/Rate Route Frequency Ordered Stop   07/31/21 2000  cefTRIAXone (ROCEPHIN) 1 g in sodium chloride 0.9 % 100 mL IVPB  Status:  Discontinued        1 g 200 mL/hr over 30 Minutes Intravenous Every 24 hours 08/15/2021 2016 08/01/21 1139   08/17/2021 1830  cefTRIAXone (ROCEPHIN) 1 g in sodium chloride 0.9 % 100 mL IVPB         1 g 200 mL/hr over 30 Minutes Intravenous  Once 08/06/2021 1822 08/05/2021 1959         Micro    Objective   Vitals:   08/04/21 2013 08/05/21 0515 08/06/21 0611 08/07/21 0614  BP: (!) 109/50 132/88 104/65 (!) 122/55  Pulse: 93 91 (!) 111 85  Resp: 18 16  15   Temp: (!) 97.5 F (36.4 C) 98.5 F (36.9 C) 98.4 F (36.9 C) (!) 97.2 F (36.2 C)  TempSrc: Oral Oral Oral   SpO2: 93% 93% 97% 99%  Weight:      Height:        Intake/Output Summary (Last 24 hours) at 08/07/2021 1510 Last data filed at 08/07/2021 0813 Gross per 24 hour  Intake --  Output 300 ml  Net -300 ml   Filed Weights   07/21/2021 1402 08/02/21 0422 08/03/21 0455  Weight: 59 kg 57.7 kg 57.2 kg    Physical Exam:  General exam: awake, laying on left side in bed, no acute distress, minimally nonverbal Respiratory system: Lungs clear bilaterally, normal respiratory effort, on room air Cardiovascular system: Regular rate and rhythm, no peripheral edema.   Gastrointestinal system: soft, nontender abdomen Central nervous system: no gross focal neurologic deficits, full neuro exam limited by patient's inability to follow commands due to dementia   Labs   Data Reviewed: I have personally reviewed following labs and imaging studies  CBC: Recent Labs  Lab 08/03/21 1713  WBC 6.3  NEUTROABS 5.2  HGB 11.0*  HCT 33.1*  MCV 98.8  PLT 158   Basic Metabolic Panel: Recent Labs  Lab 07/31/21 1628 08/01/21 0730 08/02/21 0523 08/03/21 0415 08/03/21 1713 08/04/21 0449  NA  --  150* 145 143 136 138  K  --  2.9* 3.5 4.1 4.3 4.2  CL  --  111 110 109 103 105  CO2  --  32 30 29 27 25   GLUCOSE  --  111* 116* 108* 116* 95  BUN  --  18 14 12 9 11   CREATININE  --  0.55 0.63 0.54 0.58 0.55  CALCIUM  --  8.8* 8.7* 8.8* 8.8* 8.7*  MG 1.9  --   --   --   --   --    GFR: Estimated Creatinine Clearance: 45.2 mL/min (by C-G formula based on SCr of 0.55 mg/dL). Liver Function Tests: Recent Labs  Lab 08/03/21 1713   AST 21  ALT 16  ALKPHOS 58  BILITOT 0.8  PROT 6.7  ALBUMIN 2.9*   No results for input(s): LIPASE, AMYLASE in the last 168 hours. No results for input(s): AMMONIA in the last 168 hours.  Coagulation Profile: No results for input(s): INR, PROTIME in the last 168 hours. Cardiac Enzymes: No results for input(s): CKTOTAL, CKMB, CKMBINDEX, TROPONINI in the last 168 hours. BNP (last 3 results) No results for input(s): PROBNP in the last 8760 hours. HbA1C: No results for input(s): HGBA1C  in the last 72 hours. CBG: No results for input(s): GLUCAP in the last 168 hours. Lipid Profile: No results for input(s): CHOL, HDL, LDLCALC, TRIG, CHOLHDL, LDLDIRECT in the last 72 hours. Thyroid Function Tests: No results for input(s): TSH, T4TOTAL, FREET4, T3FREE, THYROIDAB in the last 72 hours. Anemia Panel: No results for input(s): VITAMINB12, FOLATE, FERRITIN, TIBC, IRON, RETICCTPCT in the last 72 hours. Sepsis Labs: Recent Labs  Lab 08/03/21 1713  PROCALCITON <0.10    Recent Results (from the past 240 hour(s))  Resp Panel by RT-PCR (Flu A&B, Covid) Nasopharyngeal Swab     Status: None   Collection Time: 08/18/21  4:12 PM   Specimen: Nasopharyngeal Swab; Nasopharyngeal(NP) swabs in vial transport medium  Result Value Ref Range Status   SARS Coronavirus 2 by RT PCR NEGATIVE NEGATIVE Final    Comment: (NOTE) SARS-CoV-2 target nucleic acids are NOT DETECTED.  The SARS-CoV-2 RNA is generally detectable in upper respiratory specimens during the acute phase of infection. The lowest concentration of SARS-CoV-2 viral copies this assay can detect is 138 copies/mL. A negative result does not preclude SARS-Cov-2 infection and should not be used as the sole basis for treatment or other patient management decisions. A negative result may occur with  improper specimen collection/handling, submission of specimen other than nasopharyngeal swab, presence of viral mutation(s) within the areas  targeted by this assay, and inadequate number of viral copies(<138 copies/mL). A negative result must be combined with clinical observations, patient history, and epidemiological information. The expected result is Negative.  Fact Sheet for Patients:  BloggerCourse.com  Fact Sheet for Healthcare Providers:  SeriousBroker.it  This test is no t yet approved or cleared by the [place] FDA and  has been authorized for detection and/or diagnosis of SARS-CoV-2 by FDA under an Emergency Use Authorization (EUA). This EUA will remain  in effect (meaning this test can be used) for the duration of the COVID-19 declaration under Section 564(b)(1) of the Act, 21 U.S.C.section 360bbb-3(b)(1), unless the authorization is terminated  or revoked sooner.       Influenza A by PCR NEGATIVE NEGATIVE Final   Influenza B by PCR NEGATIVE NEGATIVE Final    Comment: (NOTE) The Xpert Xpress SARS-CoV-2/FLU/RSV plus assay is intended as an aid in the diagnosis of influenza from Nasopharyngeal swab specimens and should not be used as a sole basis for treatment. Nasal washings and aspirates are unacceptable for Xpert Xpress SARS-CoV-2/FLU/RSV testing.  Fact Sheet for Patients: BloggerCourse.com  Fact Sheet for Healthcare Providers: SeriousBroker.it  This test is not yet approved or cleared by the [place] FDA and has been authorized for detection and/or diagnosis of SARS-CoV-2 by FDA under an Emergency Use Authorization (EUA). This EUA will remain in effect (meaning this test can be used) for the duration of the COVID-19 declaration under Section 564(b)(1) of the Act, 21 U.S.C. section 360bbb-3(b)(1), unless the authorization is terminated or revoked.  Performed at [facility], [address] [place], [place] [address]   Urine Culture     Status: Abnormal   Collection Time:  08/18/2021  5:40 PM   Specimen: Urine, Random  Result Value Ref Range Status   Specimen Description   Final    URINE, RANDOM Performed at [facility], [address] [place], [place] [address]    Special Requests   Final    NONE Performed at [facility], [address] [place], [place] [address]    Culture MULTIPLE SPECIES PRESENT, SUGGEST RECOLLECTION (A)  Final   Report Status 08/01/2021 FINAL  Final  Blood culture (single)     Status: None   Collection Time: 08/17/2021 11:01 PM   Specimen: BLOOD  Result Value Ref Range Status   Specimen Description BLOOD LEFT HAND  Final   Special Requests   Final    BOTTLES DRAWN AEROBIC ONLY Blood Culture results may not be optimal due to an inadequate volume of blood received in culture bottles   Culture   Final    NO GROWTH 5 DAYS Performed at [facility], [address] [place], [place] [address]    Report Status 08/04/2021 FINAL  Final  CULTURE, BLOOD (ROUTINE X 2) w Reflex to ID Panel     Status: None (Preliminary result)   Collection Time: 08/03/21  5:12 PM   Specimen: BLOOD  Result Value Ref Range Status   Specimen Description BLOOD RIGHT HAND  Final   Special Requests   Final    BOTTLES DRAWN AEROBIC AND ANAEROBIC Blood Culture adequate volume   Culture   Final    NO GROWTH 4 DAYS Performed at [facility], [address], [place], [place] [address]    Report Status PENDING  Incomplete  CULTURE, BLOOD (ROUTINE X 2) w Reflex to ID Panel     Status: None (Preliminary result)   Collection Time: 08/03/21  5:14 PM   Specimen: BLOOD  Result Value Ref Range Status   Specimen Description BLOOD LEFT HAND  Final   Special Requests   Final    BOTTLES DRAWN AEROBIC ONLY Blood Culture results may not be optimal due to an inadequate volume of blood received in culture bottles   Culture   Final    NO GROWTH 4 DAYS Performed at [facility], [address], [place], [place] [address]     Report Status PENDING  Incomplete  Resp Panel by RT-PCR (Flu A&B, Covid) Nasopharyngeal Swab     Status: Abnormal   Collection Time: 08/03/21  5:42 PM   Specimen: Nasopharyngeal Swab; Nasopharyngeal(NP) swabs in vial transport medium  Result Value Ref Range Status   SARS Coronavirus 2 by RT PCR POSITIVE (A) NEGATIVE Final    Comment: RESULT CALLED TO, READ BACK BY AND VERIFIED WITH: RENEE REID @ 1930 08/03/21 LFD (NOTE) SARS-CoV-2 target nucleic acids are DETECTED.  The SARS-CoV-2 RNA is generally detectable in upper respiratory specimens during the acute phase of infection. Positive results are indicative of the presence of the identified virus, but do not rule out bacterial infection or co-infection with other pathogens not detected by the test. Clinical correlation with patient history and other diagnostic information is necessary to determine patient infection status. The expected result is Negative.  Fact Sheet for Patients: BloggerCourse.com  Fact Sheet for Healthcare Providers: SeriousBroker.it  This test is not yet approved or cleared by the [place] FDA and  has been authorized for detection and/or diagnosis of SARS-CoV-2 by FDA under an Emergency Use Authorization (EUA).  This EUA will remain in effect (meaning this test can be [place] ed) for the duration of  the COVID-19 declaration under Section 564(b)(1) of the Act, 21 U.S.C. section 360bbb-3(b)(1), unless the authorization is terminated or revoked sooner.     Influenza A by PCR NEGATIVE NEGATIVE Final   Influenza B by PCR NEGATIVE NEGATIVE Final    Comment: (NOTE) The Xpert Xpress SARS-CoV-2/FLU/RSV plus assay is intended as an aid in the diagnosis of influenza from Nasopharyngeal swab specimens and should not  be used as a sole basis for treatment. Nasal washings and aspirates are unacceptable for Xpert Xpress SARS-CoV-2/FLU/RSV testing.  Fact Sheet for  Patients: BloggerCourse.com  Fact Sheet for Healthcare Providers: SeriousBroker.it  This test is not yet approved or cleared by the [place] FDA and has been authorized for detection and/or diagnosis of SARS-CoV-2 by FDA under an Emergency Use Authorization (EUA). This EUA will remain in effect (meaning this test can be used) for the duration of the COVID-19 declaration under Section 564(b)(1) of the Act, 21 U.S.C. section 360bbb-3(b)(1), unless the authorization is terminated or revoked.  Performed at [facility], [address], [place], [place] [address]   Urine Culture     Status: None   Collection Time: 08/03/21  5:44 PM   Specimen: Urine, Random  Result Value Ref Range Status   Specimen Description   Final    URINE, RANDOM Performed at [facility], [address] [place], [place] [address]    Special Requests   Final    NONE Performed at [facility], [address], [place], [place] [address]    Culture   Final    NO GROWTH Performed at [facility], [address], [place], [place] [address]    Report Status 08/04/2021 FINAL  Final      Imaging Studies   No results found.   Medications   Scheduled Meds:  scopolamine  1 patch Transdermal Q72H   Continuous Infusions:     LOS: 8 days    Time spent: 20 minutes    Pennie Banter, DO [facility]  08/07/2021, 3:10 PM      If 7PM-7AM, please contact night-coverage. How to contact the [facility] Attending or Consulting provider 7A - 7P or covering provider during after hours 7P -7A, for this patient?    Check the care team in [facility] and look for a) attending/consulting TR[facility] provider listed and b) the [facility] team listed Log into [email or web address] and use [place]'s universal password to access. If you do not have the password, please contact the hospital operator. Locate the [facility] provider you are looking for under  [facility] and page to a number that you can be directly reached. If you still have difficulty reaching the provider, please page the [facility] (Director on Call) for the Hospitalists listed on amion for assistance.

## 2021-08-08 DIAGNOSIS — N390 Urinary tract infection, site not specified: Secondary | ICD-10-CM | POA: Diagnosis not present

## 2021-08-08 DIAGNOSIS — R4182 Altered mental status, unspecified: Secondary | ICD-10-CM | POA: Diagnosis not present

## 2021-08-08 LAB — CULTURE, BLOOD (ROUTINE X 2)
Culture: NO GROWTH
Culture: NO GROWTH
Special Requests: ADEQUATE

## 2021-08-08 NOTE — Progress Notes (Signed)
PROGRESS NOTE    Sydney Turner   DTO:[id number]  DOB: 1940/12/15  PCP: Keane Police, MD    DOA: 2021/08/03 LOS: 9    Brief Narrative / Hospital Course to Date:   From admission HPI: "Sydney Turner is a 80 y.o. female seen in ed with complaints of mental status.  Patient is a resident of a skilled nursing facility where blood work was obtained on was concerning for dehydration.  Per report family members concerned that patient may have a urinary tract infection and has had decreased p.o. intake and urine output for the past few days and has been declining over past few weeks.  Patient at baseline is nonverbal bedridden and does not follow commands.  CODE STATUS is DO NOT RESUSCITATE. HPI /ROS is otherwise unobtainable due to dementia and age. Per daughter Sydney Turner has advanced dementia and answers yes and no questions. Limited mobility."  Assessment & Plan   Principal Problem:   AMS (altered mental status) Active Problems:   Hypothyroidism (acquired)   UTI (urinary tract infection)   Hypernatremia   Comfort Care Measures - transitioned to comfort status on 10/17. Advanced dementia End-of-life care Hospice/palliative following, transitioned to comfort care 10/17. Comfort care per orders. Family request to be contacted before any morphine, ativan or other sedating meds are give. Try Tylenol suppository for signs of pain prior to using morphine Notify MD/DO if signs of pain, distress or discomfort.   Covid-19 infection - Symptoms (fever) and positive test on 10/16.  No oxygen requirement.  Day 10 of isolation will be 10/26. Contact/airborne precautions.   Dehydration Acute metabolic encephalopathy Hypernatremia, chronic Dysphagia Advanced dementia appears to be cause of above issues.  Sodium normalized with IV hydration.  Remains somnolent, dysphagia is severe.  IV fluids have been stopped as per comfort care status   Oral secretions - scopolamine patch.   Improved.     Pyuria - No overt UTI symptoms.  Urine culture w/ multiple species.  Treated with 3 days ceftriaxone.   Hypokalemia - Resolved w/ addition of kcl to fluids   Hypothyroidism - TSH low, free T4 elevated.  On levothyroxine 125 mcg as outpt Hold levothyroxine per comfort care status.  Patient BMI: Body mass index is 24.63 kg/m.   DVT prophylaxis:    Diet:  Diet Orders (From admission, onward)     Start     Ordered   08/01/21 1004  DIET - DYS 1 Room service appropriate? Yes with Assist; Fluid consistency: Nectar Thick  Diet effective now       Comments: Extra Gravy on meats, potatoes. Butters. Yogurt and puddings and Magic Cup TID meals. NO STRAWS!! Extra Spoon.  Question Answer Comment  Room service appropriate? Yes with Assist   Fluid consistency: Nectar Thick      08/01/21 1006              Code Status: DNR   Subjective 08/08/21    Patient sleeping comfortably this AM.  No family present on my encounter.  No acute events reported.    Disposition Plan & Communication   Status is: Inpatient  Remains inpatient appropriate because: On comfort care measures, on airborne and contact precautions for COVID infection.  Anticipate discharge with hospice if hemodynamically stable once off precautions   Family Communication: Daughters at bedside on rounds 10/19   Consults, Procedures, Significant Events   Consultants:  Palliative care Hospice  Procedures:  None  Antimicrobials:  Anti-infectives (From admission, onward)  Start     Dose/Rate Route Frequency Ordered Stop   07/31/21 2000  cefTRIAXone (ROCEPHIN) 1 g in sodium chloride 0.9 % 100 mL IVPB  Status:  Discontinued        1 g 200 mL/hr over 30 Minutes Intravenous Every 24 hours 08/11/2021 2016 08/01/21 1139   07/22/2021 1830  cefTRIAXone (ROCEPHIN) 1 g in sodium chloride 0.9 % 100 mL IVPB        1 g 200 mL/hr over 30 Minutes Intravenous  Once 08/11/2021 1822 08/11/2021 1959         Micro     Objective   Vitals:   08/06/21 0611 08/07/21 0614 08/07/21 1614 08/08/21 0610  BP: 104/65 (!) 122/55 (!) 112/59 (!) 108/96  Pulse: (!) 111 85 (!) 104 94  Resp:  15 20 18   Temp: 98.4 F (36.9 C) (!) 97.2 F (36.2 C) 98.3 F (36.8 C) (!) 97.5 F (36.4 C)  TempSrc: Oral  Oral Oral  SpO2: 97% 99% 95% 98%  Weight:      Height:        Intake/Output Summary (Last 24 hours) at 08/08/2021 1653 Last data filed at 08/08/2021 08/10/2021 Gross per 24 hour  Intake --  Output 150 ml  Net -150 ml   Filed Weights   07/26/2021 1402 08/02/21 0422 08/03/21 0455  Weight: 59 kg 57.7 kg 57.2 kg    Physical Exam:  General exam: sleeping comfortably, no acute distress Respiratory system: normal respiratory effort, on room air Cardiovascular system: Regular rate and rhythm, no peripheral edema.   Central nervous system: unable to perform neuro exam at this time; pt sleeping and does not follow commands at baseline.  Grossly non-focal exam   Labs   Data Reviewed: I have personally reviewed following labs and imaging studies  CBC: Recent Labs  Lab 08/03/21 1713  WBC 6.3  NEUTROABS 5.2  HGB 11.0*  HCT 33.1*  MCV 98.8  PLT 158   Basic Metabolic Panel: Recent Labs  Lab 08/02/21 0523 08/03/21 0415 08/03/21 1713 08/04/21 0449  NA 145 143 136 138  K 3.5 4.1 4.3 4.2  CL 110 109 103 105  CO2 30 29 27 25   GLUCOSE 116* 108* 116* 95  BUN 14 12 9 11   CREATININE 0.63 0.54 0.58 0.55  CALCIUM 8.7* 8.8* 8.8* 8.7*   GFR: Estimated Creatinine Clearance: 45.2 mL/min (by C-G formula based on SCr of 0.55 mg/dL). Liver Function Tests: Recent Labs  Lab 08/03/21 1713  AST 21  ALT 16  ALKPHOS 58  BILITOT 0.8  PROT 6.7  ALBUMIN 2.9*   No results for input(s): LIPASE, AMYLASE in the last 168 hours. No results for input(s): AMMONIA in the last 168 hours.  Coagulation Profile: No results for input(s): INR, PROTIME in the last 168 hours. Cardiac Enzymes: No results for input(s): CKTOTAL,  CKMB, CKMBINDEX, TROPONINI in the last 168 hours. BNP (last 3 results) No results for input(s): PROBNP in the last 8760 hours. HbA1C: No results for input(s): HGBA1C in the last 72 hours. CBG: No results for input(s): GLUCAP in the last 168 hours. Lipid Profile: No results for input(s): CHOL, HDL, LDLCALC, TRIG, CHOLHDL, LDLDIRECT in the last 72 hours. Thyroid Function Tests: No results for input(s): TSH, T4TOTAL, FREET4, T3FREE, THYROIDAB in the last 72 hours. Anemia Panel: No results for input(s): VITAMINB12, FOLATE, FERRITIN, TIBC, IRON, RETICCTPCT in the last 72 hours. Sepsis Labs: Recent Labs  Lab 08/03/21 1713  PROCALCITON <0.10    Recent Results (  from the past 240 hour(s))  Resp Panel by RT-PCR (Flu A&B, Covid) Nasopharyngeal Swab     Status: None   Collection Time: 08/08/2021  4:12 PM   Specimen: Nasopharyngeal Swab; Nasopharyngeal(NP) swabs in vial transport medium  Result Value Ref Range Status   SARS Coronavirus 2 by RT PCR NEGATIVE NEGATIVE Final    Comment: (NOTE) SARS-CoV-2 target nucleic acids are NOT DETECTED.  The SARS-CoV-2 RNA is generally detectable in upper respiratory specimens during the acute phase of infection. The lowest concentration of SARS-CoV-2 viral copies this assay can detect is 138 copies/mL. A negative result does not preclude SARS-Cov-2 infection and should not be used as the sole basis for treatment or other patient management decisions. A negative result may occur with  improper specimen collection/handling, submission of specimen other than nasopharyngeal swab, presence of viral mutation(s) within the areas targeted by this assay, and inadequate number of viral copies(<138 copies/mL). A negative result must be combined with clinical observations, patient history, and epidemiological information. The expected result is Negative.  Fact Sheet for Patients:  BloggerCourse.com  Fact Sheet for Healthcare Providers:   SeriousBroker.it  This test is no t yet approved or cleared by the [place] FDA and  has been authorized for detection and/or diagnosis of SARS-CoV-2 by FDA under an Emergency Use Authorization (EUA). This EUA will remain  in effect (meaning this test can be used) for the duration of the COVID-19 declaration under Section 564(b)(1) of the Act, 21 U.S.C.section 360bbb-3(b)(1), unless the authorization is terminated  or revoked sooner.       Influenza A by PCR NEGATIVE NEGATIVE Final   Influenza B by PCR NEGATIVE NEGATIVE Final    Comment: (NOTE) The Xpert Xpress SARS-CoV-2/FLU/RSV plus assay is intended as an aid in the diagnosis of influenza from Nasopharyngeal swab specimens and should not be used as a sole basis for treatment. Nasal washings and aspirates are unacceptable for Xpert Xpress SARS-CoV-2/FLU/RSV testing.  Fact Sheet for Patients: BloggerCourse.com  Fact Sheet for Healthcare Providers: SeriousBroker.it  This test is not yet approved or cleared by the [place] FDA and has been authorized for detection and/or diagnosis of SARS-CoV-2 by FDA under an Emergency Use Authorization (EUA). This EUA will remain in effect (meaning this test can be used) for the duration of the COVID-19 declaration under Section 564(b)(1) of the Act, 21 U.S.C. section 360bbb-3(b)(1), unless the authorization is terminated or revoked.  Performed at [facility], [address], [place], [place] [address]   Urine Culture     Status: Abnormal   Collection Time: 08/17/2021  5:40 PM   Specimen: Urine, Random  Result Value Ref Range Status   Specimen Description   Final    URINE, RANDOM Performed at [facility], [address] [place], [place] [address]    Special Requests   Final    NONE Performed at [facility], [address] [place], [place] [address]    Culture  MULTIPLE SPECIES PRESENT, SUGGEST RECOLLECTION (A)  Final   Report Status 08/01/2021 FINAL  Final  Blood culture (single)     Status: None   Collection Time: 08/11/2021 11:01 PM   Specimen: BLOOD  Result Value Ref Range Status   Specimen Description BLOOD LEFT HAND  Final   Special Requests   Final    BOTTLES DRAWN AEROBIC ONLY Blood Culture results may not be optimal due to an inadequate volume of blood received in culture bottles   Culture  Final    NO GROWTH 5 DAYS Performed at [facility], [address] [place], [place] [address]    Report Status 08/04/2021 FINAL  Final  CULTURE, BLOOD (ROUTINE X 2) w Reflex to ID Panel     Status: None   Collection Time: 08/03/21  5:12 PM   Specimen: BLOOD  Result Value Ref Range Status   Specimen Description BLOOD RIGHT HAND  Final   Special Requests   Final    BOTTLES DRAWN AEROBIC AND ANAEROBIC Blood Culture adequate volume   Culture   Final    NO GROWTH 5 DAYS Performed at [facility], [address] [place], [place] [address]    Report Status 08/08/2021 FINAL  Final  CULTURE, BLOOD (ROUTINE X 2) w Reflex to ID Panel     Status: None   Collection Time: 08/03/21  5:14 PM   Specimen: BLOOD  Result Value Ref Range Status   Specimen Description BLOOD LEFT HAND  Final   Special Requests   Final    BOTTLES DRAWN AEROBIC ONLY Blood Culture results may not be optimal due to an inadequate volume of blood received in culture bottles   Culture   Final    NO GROWTH 5 DAYS Performed at [facility], [address] [place], [place] [address]    Report Status 08/08/2021 FINAL  Final  Resp Panel by RT-PCR (Flu A&B, Covid) Nasopharyngeal Swab     Status: Abnormal   Collection Time: 08/03/21  5:42 PM   Specimen: Nasopharyngeal Swab; Nasopharyngeal(NP) swabs in vial transport medium  Result Value Ref Range Status   SARS Coronavirus 2 by RT PCR POSITIVE (A) NEGATIVE Final    Comment: RESULT CALLED TO, READ BACK BY  AND VERIFIED WITH: RENEE REID @ 1930 08/03/21 L[facility] (NOTE) SARS-CoV-2 target nucleic acids are DETECTED.  The SARS-CoV-2 RNA is generally detectable in upper respiratory specimens during the acute phase of infection. Positive results are indicative of the presence of the identified virus, but do not rule out bacterial infection or co-infection with other pathogens not detected by the test. Clinical correlation with patient history and other diagnostic information is necessary to determine patient infection status. The expected result is Negative.  Fact Sheet for Patients: BloggerCourse.com  Fact Sheet for Healthcare Providers: SeriousBroker.it  This test is not yet approved or cleared by the [place] FDA and  has been authorized for detection and/or diagnosis of SARS-CoV-2 by FDA under an Emergency Use Authorization (EUA).  This EUA will remain in effect (meaning this test can be [place] ed) for the duration of  the COVID-19 declaration under Section 564(b)(1) of the Act, 21 U.S.C. section 360bbb-3(b)(1), unless the authorization is terminated or revoked sooner.     Influenza A by PCR NEGATIVE NEGATIVE Final   Influenza B by PCR NEGATIVE NEGATIVE Final    Comment: (NOTE) The Xpert Xpress SARS-CoV-2/FLU/RSV plus assay is intended as an aid in the diagnosis of influenza from Nasopharyngeal swab specimens and should not be used as a sole basis for treatment. Nasal washings and aspirates are unacceptable for Xpert Xpress SARS-CoV-2/FLU/RSV testing.  Fact Sheet for Patients: BloggerCourse.com  Fact Sheet for Healthcare Providers: SeriousBroker.it  This test is not yet approved or cleared by the [place] FDA and has been authorized for detection and/or diagnosis of SARS-CoV-2 by FDA under an Emergency Use Authorization (EUA). This EUA will remain in effect (meaning this test  can be used) for the duration of the COVID-19 declaration  under Section 564(b)(1) of the Act, 21 U.S.C. section 360bbb-3(b)(1), unless the authorization is terminated or revoked.  Performed at [facility], [address], [place], [place] [address]   Urine Culture     Status: None   Collection Time: 08/03/21  5:44 PM   Specimen: Urine, Random  Result Value Ref Range Status   Specimen Description   Final    URINE, RANDOM Performed at [facility], [address] [place], [place] [address]    Special Requests   Final    NONE Performed at [facility], [address], [place], [place] [address]    Culture   Final    NO GROWTH Performed at [facility], [address] [place], [place] [address]    Report Status 08/04/2021 FINAL  Final      Imaging Studies   No results found.   Medications   Scheduled Meds:  scopolamine  1 patch Transdermal Q72H   Continuous Infusions:     LOS: 9 days    Time spent: 20 minutes    Pennie Banter, DO [facility]  08/08/2021, 4:53 PM      If 7PM-7AM, please contact night-coverage. How to contact the [facility] Attending or Consulting provider 7A - 7P or covering provider during after hours 7P -7A, for this patient?    Check the care team in [facility] and look for a) attending/consulting [facility] provider listed and b) the [facility] team listed Log into [email or web address] and use [facility] universal password to access. If you do not have the password, please contact the hospital operator. Locate the [facility] provider you are looking for under [facility] and page to a number that you can be directly reached. If you still have difficulty reaching the provider, please page the [facility] (Director on Call) for the Hospitalists listed on amion for assistance.

## 2021-08-09 DIAGNOSIS — Z515 Encounter for palliative care: Secondary | ICD-10-CM

## 2021-08-09 DIAGNOSIS — R4182 Altered mental status, unspecified: Secondary | ICD-10-CM | POA: Diagnosis not present

## 2021-08-09 NOTE — Progress Notes (Signed)
PROGRESS NOTE    CODA FILLER   CHE:[id number]  DOB: December 05, 1940  PCP: Keane Police, MD    DOA: 08/16/2021 LOS: 10    Brief Narrative / Hospital Course to Date:   From admission HPI: "Sydney Turner is a 80 y.o. female seen in ed with complaints of mental status.  Patient is a resident of a skilled nursing facility where blood work was obtained on was concerning for dehydration.  Per report family members concerned that patient may have a urinary tract infection and has had decreased p.o. intake and urine output for the past few days and has been declining over past few weeks.  Patient at baseline is nonverbal bedridden and does not follow commands.  CODE STATUS is DO NOT RESUSCITATE. HPI /ROS is otherwise unobtainable due to dementia and age. Per daughter mom has advanced dementia and answers yes and no questions. Limited mobility."  Assessment & Plan   Principal Problem:   AMS (altered mental status) Active Problems:   Hypothyroidism (acquired)   UTI (urinary tract infection)   Hypernatremia   Comfort Care Measures - transitioned to comfort status on 10/17. Advanced dementia End-of-life care Hospice/palliative following, transitioned to comfort care 10/17. Comfort care per orders. Family request to be contacted before any morphine, ativan or other sedating meds are give. Try Tylenol suppository for signs of pain prior to using morphine Notify MD/DO if signs of pain, distress or discomfort.   Covid-19 infection - Symptoms (fever) and positive test on 10/16.  No oxygen requirement.  Day 10 of isolation will be 10/26. Contact/airborne precautions.   Dehydration Acute metabolic encephalopathy Hypernatremia, chronic Dysphagia Advanced dementia appears to be cause of above issues.  Sodium normalized with IV hydration.  Remains somnolent, dysphagia is severe.  IV fluids have been stopped as per comfort care status   Oral secretions - scopolamine patch.   Improved.     Pyuria - No overt UTI symptoms.  Urine culture w/ multiple species.  Treated with 3 days ceftriaxone.   Hypokalemia - Resolved w/ addition of kcl to fluids   Hypothyroidism - TSH low, free T4 elevated.  On levothyroxine 125 mcg as outpt Hold levothyroxine per comfort care status.  Patient BMI: Body mass index is 24.63 kg/m.   DVT prophylaxis:    Diet:  Diet Orders (From admission, onward)     Start     Ordered   08/01/21 1004  DIET - DYS 1 Room service appropriate? Yes with Assist; Fluid consistency: Nectar Thick  Diet effective now       Comments: Extra Gravy on meats, potatoes. Butters. Yogurt and puddings and Magic Cup TID meals. NO STRAWS!! Extra Spoon.  Question Answer Comment  Room service appropriate? Yes with Assist   Fluid consistency: Nectar Thick      08/01/21 1006              Code Status: DNR   Subjective 08/09/21    Patient patient laying on her back in bed with eyes awake when seen this morning.  She does not respond or interact.  No acute events reported.  No family at bedside during my encounter.  Nurse reports she ate only a few spoons of nectar.   Disposition Plan & Communication   Status is: Inpatient  Remains inpatient appropriate because: On comfort care measures, on airborne and contact precautions for COVID infection.  Anticipate discharge with hospice if hemodynamically stable once off precautions   Family Communication: Daughters at bedside on  rounds 10/19   Consults, Procedures, Significant Events   Consultants:  Palliative care Hospice  Procedures:  None  Antimicrobials:  Anti-infectives (From admission, onward)    Start     Dose/Rate Route Frequency Ordered Stop   07/31/21 2000  cefTRIAXone (ROCEPHIN) 1 g in sodium chloride 0.9 % 100 mL IVPB  Status:  Discontinued        1 g 200 mL/hr over 30 Minutes Intravenous Every 24 hours 08/27/2021 2016 08/01/21 1139   08/27/2021 1830  cefTRIAXone (ROCEPHIN) 1 g in  sodium chloride 0.9 % 100 mL IVPB        1 g 200 mL/hr over 30 Minutes Intravenous  Once 08-27-2021 1822 Aug 27, 2021 1959         Micro    Objective   Vitals:   08/07/21 0614 08/07/21 1614 08/08/21 0610 08/09/21 0615  BP: (!) 122/55 (!) 112/59 (!) 108/96 (!) 127/56  Pulse: 85 (!) 104 94 93  Resp: 15 20 18 16   Temp: (!) 97.2 F (36.2 C) 98.3 F (36.8 C) (!) 97.5 F (36.4 C) 98.1 F (36.7 C)  TempSrc:  Oral Oral Oral  SpO2: 99% 95% 98% 96%  Weight:      Height:       No intake or output data in the 24 hours ending 08/09/21 1452  Filed Weights   27-Aug-2021 1402 08/02/21 0422 08/03/21 0455  Weight: 59 kg 57.7 kg 57.2 kg    Physical Exam:  General exam: Awake but not responsive, no acute distress Respiratory system: normal respiratory effort, on room air Cardiovascular system: Regular rate and rhythm, no peripheral edema.   Central nervous system: unable to perform neuro exam at this time; pt sleeping and does not follow commands at baseline.  Grossly non-focal exam   Labs   Data Reviewed: I have personally reviewed following labs and imaging studies  CBC: Recent Labs  Lab 08/03/21 1713  WBC 6.3  NEUTROABS 5.2  HGB 11.0*  HCT 33.1*  MCV 98.8  PLT 158   Basic Metabolic Panel: Recent Labs  Lab 08/03/21 0415 08/03/21 1713 08/04/21 0449  NA 143 136 138  K 4.1 4.3 4.2  CL 109 103 105  CO2 29 27 25   GLUCOSE 108* 116* 95  BUN 12 9 11   CREATININE 0.54 0.58 0.55  CALCIUM 8.8* 8.8* 8.7*   GFR: Estimated Creatinine Clearance: 45.2 mL/min (by C-G formula based on SCr of 0.55 mg/dL). Liver Function Tests: Recent Labs  Lab 08/03/21 1713  AST 21  ALT 16  ALKPHOS 58  BILITOT 0.8  PROT 6.7  ALBUMIN 2.9*   No results for input(s): LIPASE, AMYLASE in the last 168 hours. No results for input(s): AMMONIA in the last 168 hours.  Coagulation Profile: No results for input(s): INR, PROTIME in the last 168 hours. Cardiac Enzymes: No results for input(s): CKTOTAL,  CKMB, CKMBINDEX, TROPONINI in the last 168 hours. BNP (last 3 results) No results for input(s): PROBNP in the last 8760 hours. HbA1C: No results for input(s): HGBA1C in the last 72 hours. CBG: No results for input(s): GLUCAP in the last 168 hours. Lipid Profile: No results for input(s): CHOL, HDL, LDLCALC, TRIG, CHOLHDL, LDLDIRECT in the last 72 hours. Thyroid Function Tests: No results for input(s): TSH, T4TOTAL, FREET4, T3FREE, THYROIDAB in the last 72 hours. Anemia Panel: No results for input(s): VITAMINB12, FOLATE, FERRITIN, TIBC, IRON, RETICCTPCT in the last 72 hours. Sepsis Labs: Recent Labs  Lab 08/03/21 1713  PROCALCITON <0.10  Recent Results (from the past 240 hour(s))  Resp Panel by RT-PCR (Flu A&B, Covid) Nasopharyngeal Swab     Status: None   Collection Time: 07/22/2021  4:12 PM   Specimen: Nasopharyngeal Swab; Nasopharyngeal(NP) swabs in vial transport medium  Result Value Ref Range Status   SARS Coronavirus 2 by RT PCR NEGATIVE NEGATIVE Final    Comment: (NOTE) SARS-CoV-2 target nucleic acids are NOT DETECTED.  The SARS-CoV-2 RNA is generally detectable in upper respiratory specimens during the acute phase of infection. The lowest concentration of SARS-CoV-2 viral copies this assay can detect is 138 copies/mL. A negative result does not preclude SARS-Cov-2 infection and should not be used as the sole basis for treatment or other patient management decisions. A negative result may occur with  improper specimen collection/handling, submission of specimen other than nasopharyngeal swab, presence of viral mutation(s) within the areas targeted by this assay, and inadequate number of viral copies(<138 copies/mL). A negative result must be combined with clinical observations, patient history, and epidemiological information. The expected result is Negative.  Fact Sheet for Patients:  BloggerCourse.com  Fact Sheet for Healthcare Providers:   SeriousBroker.it  This test is no t yet approved or cleared by the [place] FDA and  has been authorized for detection and/or diagnosis of SARS-CoV-2 by FDA under an Emergency Use Authorization (EUA). This EUA will remain  in effect (meaning this test can be used) for the duration of the COVID-19 declaration under Section 564(b)(1) of the Act, 21 U.S.C.section 360bbb-3(b)(1), unless the authorization is terminated  or revoked sooner.       Influenza A by PCR NEGATIVE NEGATIVE Final   Influenza B by PCR NEGATIVE NEGATIVE Final    Comment: (NOTE) The Xpert Xpress SARS-CoV-2/FLU/RSV plus assay is intended as an aid in the diagnosis of influenza from Nasopharyngeal swab specimens and should not be used as a sole basis for treatment. Nasal washings and aspirates are unacceptable for Xpert Xpress SARS-CoV-2/FLU/RSV testing.  Fact Sheet for Patients: BloggerCourse.com  Fact Sheet for Healthcare Providers: SeriousBroker.it  This test is not yet approved or cleared by the [place] FDA and has been authorized for detection and/or diagnosis of SARS-CoV-2 by FDA under an Emergency Use Authorization (EUA). This EUA will remain in effect (meaning this test can be used) for the duration of the COVID-19 declaration under Section 564(b)(1) of the Act, 21 U.S.C. section 360bbb-3(b)(1), unless the authorization is terminated or revoked.  Performed at [facility], [address], [place], [place] [address]   Urine Culture     Status: Abnormal   Collection Time: 08/18/2021  5:40 PM   Specimen: Urine, Random  Result Value Ref Range Status   Specimen Description   Final    URINE, RANDOM Performed at [facility], [address] [place], [place] [address]    Special Requests   Final    NONE Performed at [facility], [address] [place], [place] [address]    Culture  MULTIPLE SPECIES PRESENT, SUGGEST RECOLLECTION (A)  Final   Report Status 08/01/2021 FINAL  Final  Blood culture (single)     Status: None   Collection Time: 08/01/2021 11:01 PM   Specimen: BLOOD  Result Value Ref Range Status   Specimen Description BLOOD LEFT HAND  Final   Special Requests   Final    BOTTLES DRAWN AEROBIC ONLY Blood Culture results may not be optimal due to an inadequate volume of blood received in culture bottles   Culture  Final    NO GROWTH 5 DAYS Performed at [facility], [address] [place], [place] [address]    Report Status 08/04/2021 FINAL  Final  CULTURE, BLOOD (ROUTINE X 2) w Reflex to ID Panel     Status: None   Collection Time: 08/03/21  5:12 PM   Specimen: BLOOD  Result Value Ref Range Status   Specimen Description BLOOD RIGHT HAND  Final   Special Requests   Final    BOTTLES DRAWN AEROBIC AND ANAEROBIC Blood Culture adequate volume   Culture   Final    NO GROWTH 5 DAYS Performed at [facility], [address] [place], [place] [address]    Report Status 08/08/2021 FINAL  Final  CULTURE, BLOOD (ROUTINE X 2) w Reflex to ID Panel     Status: None   Collection Time: 08/03/21  5:14 PM   Specimen: BLOOD  Result Value Ref Range Status   Specimen Description BLOOD LEFT HAND  Final   Special Requests   Final    BOTTLES DRAWN AEROBIC ONLY Blood Culture results may not be optimal due to an inadequate volume of blood received in culture bottles   Culture   Final    NO GROWTH 5 DAYS Performed at [facility], [address] [place], [place] [address]    Report Status 08/08/2021 FINAL  Final  Resp Panel by RT-PCR (Flu A&B, Covid) Nasopharyngeal Swab     Status: Abnormal   Collection Time: 08/03/21  5:42 PM   Specimen: Nasopharyngeal Swab; Nasopharyngeal(NP) swabs in vial transport medium  Result Value Ref Range Status   SARS Coronavirus 2 by RT PCR POSITIVE (A) NEGATIVE Final    Comment: RESULT CALLED TO, READ BACK BY  AND VERIFIED WITH: RENEE REID @ 1930 08/03/21 LFD (NOTE) SARS-CoV-2 target nucleic acids are DETECTED.  The SARS-CoV-2 RNA is generally detectable in upper respiratory specimens during the acute phase of infection. Positive results are indicative of the presence of the identified virus, but do not rule out bacterial infection or co-infection with other pathogens not detected by the test. Clinical correlation with patient history and other diagnostic information is necessary to determine patient infection status. The expected result is Negative.  Fact Sheet for Patients: BloggerCourse.com  Fact Sheet for Healthcare Providers: SeriousBroker.it  This test is not yet approved or cleared by the [place] FDA and  has been authorized for detection and/or diagnosis of SARS-CoV-2 by FDA under an Emergency Use Authorization (EUA).  This EUA will remain in effect (meaning this test can be [place] ed) for the duration of  the COVID-19 declaration under Section 564(b)(1) of the Act, 21 U.S.C. section 360bbb-3(b)(1), unless the authorization is terminated or revoked sooner.     Influenza A by PCR NEGATIVE NEGATIVE Final   Influenza B by PCR NEGATIVE NEGATIVE Final    Comment: (NOTE) The Xpert Xpress SARS-CoV-2/FLU/RSV plus assay is intended as an aid in the diagnosis of influenza from Nasopharyngeal swab specimens and should not be used as a sole basis for treatment. Nasal washings and aspirates are unacceptable for Xpert Xpress SARS-CoV-2/FLU/RSV testing.  Fact Sheet for Patients: BloggerCourse.com  Fact Sheet for Healthcare Providers: SeriousBroker.it  This test is not yet approved or cleared by the [place] FDA and has been authorized for detection and/or diagnosis of SARS-CoV-2 by FDA under an Emergency Use Authorization (EUA). This EUA will remain in effect (meaning this test  can be used) for the duration of the COVID-19 declaration  under Section 564(b)(1) of the Act, 21 U.S.C. section 360bbb-3(b)(1), unless the authorization is terminated or revoked.  Performed at [facility], [address], [place], [place] [address]   Urine Culture     Status: None   Collection Time: 08/03/21  5:44 PM   Specimen: Urine, Random  Result Value Ref Range Status   Specimen Description   Final    URINE, RANDOM Performed at [facility], [address], [place], [place] [address]    Special Requests   Final    NONE Performed at [facility], [address], [place], [place] [address]    Culture   Final    NO GROWTH Performed at [facility], [address] [place], [place] [address]    Report Status 08/04/2021 FINAL  Final      Imaging Studies   No results found.   Medications   Scheduled Meds:  scopolamine  1 patch Transdermal Q72H   Continuous Infusions:     LOS: 10 days    Time spent: 20 minutes    Pennie Banter, DO [facility]  08/09/2021, 2:52 PM      If 7PM-7AM, please contact night-coverage. How to contact the [facility] Attending or Consulting provider 7A - 7P or covering provider during after hours 7P -7A, for this patient?    Check the care team in [facility] and look for a) attending/consulting [facility] provider listed and b) the [facility] team listed Log into [email or web address] and use [place]'s universal password to access. If you do not have the password, please contact the hospital operator. Locate the [facility] provider you are looking for under [facility] and page to a number that you can be directly reached. If you still have difficulty reaching the provider, please page the [facility] (Director on Call) for the Hospitalists listed on amion for assistance.

## 2021-08-10 DIAGNOSIS — Z515 Encounter for palliative care: Secondary | ICD-10-CM | POA: Diagnosis not present

## 2021-08-10 NOTE — Progress Notes (Signed)
PROGRESS NOTE    Sydney Turner   ZOX:[id number]  DOB: 1941/06/09  PCP: Keane Police, MD    DOA: 07-Aug-2021 LOS: 11    Brief Narrative / Hospital Course to Date:   From admission HPI: "Sydney Turner is a 80 y.o. female seen in ed with complaints of mental status.  Patient is a resident of a skilled nursing facility where blood work was obtained on was concerning for dehydration.  Per report family members concerned that patient may have a urinary tract infection and has had decreased p.o. intake and urine output for the past few days and has been declining over past few weeks.  Patient at baseline is nonverbal bedridden and does not follow commands.  CODE STATUS is DO NOT RESUSCITATE. HPI /ROS is otherwise unobtainable due to dementia and age. Per daughter mom has advanced dementia and answers yes and no questions. Limited mobility."  Assessment & Plan   Principal Problem:   End of life care Active Problems:   Hypothyroidism (acquired)   UTI (urinary tract infection)   AMS (altered mental status)   Hypernatremia   Comfort Care Measures - transitioned to comfort status on 10/17. Advanced dementia End-of-life care Hospice/palliative following, transitioned to comfort care 10/17. Comfort care per orders. Family request to be contacted before any morphine, ativan or other sedating meds are give. Try Tylenol suppository for signs of pain prior to using morphine Notify MD/DO if signs of pain, distress or discomfort.   Covid-19 infection - Symptoms (fever) and positive test on 10/16.  No oxygen requirement.  Day 10 of isolation will be 10/26. Contact/airborne precautions.   Dehydration Acute metabolic encephalopathy Hypernatremia, chronic Dysphagia Advanced dementia appears to be cause of above issues.  Sodium normalized with IV hydration.  Remains somnolent, dysphagia is severe.  IV fluids have been stopped as per comfort care status   Oral secretions -  scopolamine patch.  Improved.     Pyuria - No overt UTI symptoms.  Urine culture w/ multiple species.  Treated with 3 days ceftriaxone.   Hypokalemia - Resolved w/ addition of kcl to fluids   Hypothyroidism - TSH low, free T4 elevated.  On levothyroxine 125 mcg as outpt Hold levothyroxine per comfort care status.  Patient BMI: Body mass index is 24.63 kg/m.   DVT prophylaxis:    Diet:  Diet Orders (From admission, onward)     Start     Ordered   08/01/21 1004  DIET - DYS 1 Room service appropriate? Yes with Assist; Fluid consistency: Nectar Thick  Diet effective now       Comments: Extra Gravy on meats, potatoes. Butters. Yogurt and puddings and Magic Cup TID meals. NO STRAWS!! Extra Spoon.  Question Answer Comment  Room service appropriate? Yes with Assist   Fluid consistency: Nectar Thick      08/01/21 1006              Code Status: DNR   Subjective 08/10/21    Patient sleeping comfortably when seen this morning.  She does not respond to verbal or tactile stimulus.  No family at bedside during my encounter.  No acute events reported overnight.   Disposition Plan & Communication   Status is: Inpatient  Remains inpatient appropriate because: On comfort care measures, on airborne and contact precautions for COVID infection.  Anticipate discharge with hospice if hemodynamically stable once off precautions   Family Communication: Daughters at bedside on rounds 10/19   Consults, Procedures, Significant Events  Consultants:  Palliative care Hospice  Procedures:  None  Antimicrobials:  Anti-infectives (From admission, onward)    Start     Dose/Rate Route Frequency Ordered Stop   07/31/21 2000  cefTRIAXone (ROCEPHIN) 1 g in sodium chloride 0.9 % 100 mL IVPB  Status:  Discontinued        1 g 200 mL/hr over 30 Minutes Intravenous Every 24 hours 07/26/2021 2016 08/01/21 1139   07/20/2021 1830  cefTRIAXone (ROCEPHIN) 1 g in sodium chloride 0.9 % 100 mL IVPB         1 g 200 mL/hr over 30 Minutes Intravenous  Once 07/24/2021 1822 07/29/2021 1959         Micro    Objective   Vitals:   08/07/21 1614 08/08/21 0610 08/09/21 0615 08/10/21 0543  BP: (!) 112/59 (!) 108/96 (!) 127/56 127/75  Pulse: (!) 104 94 93 (!) 101  Resp: 20 18 16 16   Temp: 98.3 F (36.8 C) (!) 97.5 F (36.4 C) 98.1 F (36.7 C) 98.3 F (36.8 C)  TempSrc: Oral Oral Oral Oral  SpO2: 95% 98% 96% 93%  Weight:      Height:       No intake or output data in the 24 hours ending 08/10/21 1519  Filed Weights   08/08/2021 1402 08/02/21 0422 08/03/21 0455  Weight: 59 kg 57.7 kg 57.2 kg    Physical Exam:  General exam: Sleeping comfortably, no acute distress Respiratory system: Lungs clear, normal respiratory effort, on room air Cardiovascular system: Regular in rhythm, no peripheral edema.   Central nervous system: Patient sleeping and unresponsive, does not follow commands due to advanced dementia   Labs   Data Reviewed: I have personally reviewed following labs and imaging studies  CBC: Recent Labs  Lab 08/03/21 1713  WBC 6.3  NEUTROABS 5.2  HGB 11.0*  HCT 33.1*  MCV 98.8  PLT 158   Basic Metabolic Panel: Recent Labs  Lab 08/03/21 1713 08/04/21 0449  NA 136 138  K 4.3 4.2  CL 103 105  CO2 27 25  GLUCOSE 116* 95  BUN 9 11  CREATININE 0.58 0.55  CALCIUM 8.8* 8.7*   GFR: Estimated Creatinine Clearance: 45.2 mL/min (by C-G formula based on SCr of 0.55 mg/dL). Liver Function Tests: Recent Labs  Lab 08/03/21 1713  AST 21  ALT 16  ALKPHOS 58  BILITOT 0.8  PROT 6.7  ALBUMIN 2.9*   No results for input(s): LIPASE, AMYLASE in the last 168 hours. No results for input(s): AMMONIA in the last 168 hours.  Coagulation Profile: No results for input(s): INR, PROTIME in the last 168 hours. Cardiac Enzymes: No results for input(s): CKTOTAL, CKMB, CKMBINDEX, TROPONINI in the last 168 hours. BNP (last 3 results) No results for input(s): PROBNP in the last  8760 hours. HbA1C: No results for input(s): HGBA1C in the last 72 hours. CBG: No results for input(s): GLUCAP in the last 168 hours. Lipid Profile: No results for input(s): CHOL, HDL, LDLCALC, TRIG, CHOLHDL, LDLDIRECT in the last 72 hours. Thyroid Function Tests: No results for input(s): TSH, T4TOTAL, FREET4, T3FREE, THYROIDAB in the last 72 hours. Anemia Panel: No results for input(s): VITAMINB12, FOLATE, FERRITIN, TIBC, IRON, RETICCTPCT in the last 72 hours. Sepsis Labs: Recent Labs  Lab 08/03/21 1713  PROCALCITON <0.10    Recent Results (from the past 240 hour(s))  CULTURE, BLOOD (ROUTINE X 2) w Reflex to ID Panel     Status: None   Collection Time: 08/03/21  5:12  PM   Specimen: BLOOD  Result Value Ref Range Status   Specimen Description BLOOD RIGHT HAND  Final   Special Requests   Final    BOTTLES DRAWN AEROBIC AND ANAEROBIC Blood Culture adequate volume   Culture   Final    NO GROWTH 5 DAYS Performed at [facility], [address] [place], [place] [address]    Report Status 08/08/2021 FINAL  Final  CULTURE, BLOOD (ROUTINE X 2) w Reflex to ID Panel     Status: None   Collection Time: 08/03/21  5:14 PM   Specimen: BLOOD  Result Value Ref Range Status   Specimen Description BLOOD LEFT HAND  Final   Special Requests   Final    BOTTLES DRAWN AEROBIC ONLY Blood Culture results may not be optimal due to an inadequate volume of blood received in culture bottles   Culture   Final    NO GROWTH 5 DAYS Performed at [facility], [address] [place], [place] [address]    Report Status 08/08/2021 FINAL  Final  Resp Panel by RT-PCR (Flu A&B, Covid) Nasopharyngeal Swab     Status: Abnormal   Collection Time: 08/03/21  5:42 PM   Specimen: Nasopharyngeal Swab; Nasopharyngeal(NP) swabs in vial transport medium  Result Value Ref Range Status   SARS Coronavirus 2 by RT PCR POSITIVE (A) NEGATIVE Final    Comment: RESULT CALLED TO, READ BACK BY AND VERIFIED  WITH: RENEE REID @ 1930 08/03/21 L[facility] (NOTE) SARS-CoV-2 target nucleic acids are DETECTED.  The SARS-CoV-2 RNA is generally detectable in upper respiratory specimens during the acute phase of infection. Positive results are indicative of the presence of the identified virus, but do not rule out bacterial infection or co-infection with other pathogens not detected by the test. Clinical correlation with patient history and other diagnostic information is necessary to determine patient infection status. The expected result is Negative.  Fact Sheet for Patients: BloggerCourse.com  Fact Sheet for Healthcare Providers: SeriousBroker.it  This test is not yet approved or cleared by the [place] FDA and  has been authorized for detection and/or diagnosis of SARS-CoV-2 by FDA under an Emergency Use Authorization (EUA).  This EUA will remain in effect (meaning this test can be [place] ed) for the duration of  the COVID-19 declaration under Section 564(b)(1) of the Act, 21 U.S.C. section 360bbb-3(b)(1), unless the authorization is terminated or revoked sooner.     Influenza A by PCR NEGATIVE NEGATIVE Final   Influenza B by PCR NEGATIVE NEGATIVE Final    Comment: (NOTE) The Xpert Xpress SARS-CoV-2/FLU/RSV plus assay is intended as an aid in the diagnosis of influenza from Nasopharyngeal swab specimens and should not be used as a sole basis for treatment. Nasal washings and aspirates are unacceptable for Xpert Xpress SARS-CoV-2/FLU/RSV testing.  Fact Sheet for Patients: BloggerCourse.com  Fact Sheet for Healthcare Providers: SeriousBroker.it  This test is not yet approved or cleared by the [place] FDA and has been authorized for detection and/or diagnosis of SARS-CoV-2 by FDA under an Emergency Use Authorization (EUA). This EUA will remain in effect (meaning this test can be used)  for the duration of the COVID-19 declaration under Section 564(b)(1) of the Act, 21 U.S.C. section 360bbb-3(b)(1), unless the authorization is terminated or revoked.  Performed at [facility], [address], [place], [place] [address]   Urine Culture     Status: None   Collection Time: 08/03/21  5:44 PM   Specimen: Urine, Random  Result Value Ref Range Status   Specimen Description   Final    URINE, RANDOM Performed at [facility], [address], [place], [place] [address]    Special Requests   Final    NONE Performed at [facility], [address], [place], [place] [address]    Culture   Final    NO GROWTH Performed at [facility], [address], [place], [place] [address]    Report Status 08/04/2021 FINAL  Final      Imaging Studies   No results found.   Medications   Scheduled Meds:  scopolamine  1 patch Transdermal Q72H   Continuous Infusions:     LOS: 11 days    Time spent: 20 minutes    Pennie Banter, DO [facility]  08/10/2021, 3:19 PM      If 7PM-7AM, please contact night-coverage. How to contact the [facility] Attending or Consulting provider 7A - 7P or covering provider during after hours 7P -7A, for this patient?    Check the care team in [facility] and look for a) attending/consulting TR[facility] provider listed and b) the [facility] team listed Log into [email or web address] and use [facility]'s universal password to access. If you do not have the password, please contact the hospital operator. Locate the [facility] provider you are looking for under [facility] and page to a number that you can be directly reached. If you still have difficulty reaching the provider, please page the [facility] (Director on Call) for the Hospitalists listed on amion for assistance.

## 2021-08-10 NOTE — Progress Notes (Signed)
Spoke with daughter, she stated that rather than full meal trays being delivered that she would not eat, having the Magic Cups and thickened fluids readily available from units nutrition room would be enough for now. I notified the MD, Denton Lank, who gave verbal order for NPO diet status with those exceptions.

## 2021-08-11 DIAGNOSIS — Z515 Encounter for palliative care: Secondary | ICD-10-CM | POA: Diagnosis not present

## 2021-08-11 MED ORDER — MORPHINE 100MG IN NS 100ML (1MG/ML) PREMIX INFUSION
2.0000 mg/h | INTRAVENOUS | Status: DC
  Administered 2021-08-11: 15:00:00 2 mg/h via INTRAVENOUS
  Filled 2021-08-11: qty 100

## 2021-08-11 NOTE — TOC Progression Note (Signed)
Transition of Care [facility]) - Progression Note    Patient Details  Name: Sydney Turner MRN: [id number] Date of Birth: 1941/05/16  Transition of Care [facility]) CM/SW Contact  Caryn Section, RN Phone Number: 08/11/2021, 2:31 PM  Clinical Narrative:    patient is still waiting for hospice house.  Family is conferring with MD and [facility].    Expected Discharge Plan: Skilled Nursing Facility Barriers to Discharge: Continued Medical Work up  Expected Discharge Plan and Services Expected Discharge Plan: Skilled Nursing Facility   Discharge Planning Services: CM Consult Post Acute Care Choice: Skilled Nursing Facility Living arrangements for the past 2 months: Skilled Nursing Facility                 DME Arranged:  (n/a patient has equipment in facility)         HH Arranged:  (n/a facility resident)           Social Determinants of Health (SDOH) Interventions    Readmission Risk Interventions No flowsheet data found.

## 2021-08-11 NOTE — Progress Notes (Signed)
[facility] [address] [employer] [facility]) Hospital Liaison Note  Spoke with daughter Cordelia Pen by phone and with daughter [name] by patient's room. Plan remains to transport patient to Hospice Home, if eligible, once Covid isolation has ended and once a bed is available.   Report exchanged with hospital care team.  Please do not hesitate to call with any hospice related questions or concerns.   Thank you,  Bobbie "Einar Gip, RN, BSN [facility] Liaison [phone]

## 2021-08-11 NOTE — Progress Notes (Signed)
PROGRESS NOTE    Sydney Turner   BJY:[id number]  DOB: [birth year]  PCP: Keane Police, MD    DOA: 08-21-21 LOS: 12    Brief Narrative / Hospital Course to Date:   From admission HPI: "Sydney Turner is a 80 y.o. female seen in ed with complaints of mental status.  Patient is a resident of a skilled nursing facility where blood work was obtained on was concerning for dehydration.  Per report family members concerned that patient may have a urinary tract infection and has had decreased p.o. intake and urine output for the past few days and has been declining over past few weeks.  Patient at baseline is nonverbal bedridden and does not follow commands.  CODE STATUS is DO NOT RESUSCITATE. HPI /ROS is otherwise unobtainable due to dementia and age. Per daughter mom has advanced dementia and answers yes and no questions. Limited mobility."  Assessment & Plan   Principal Problem:   End of life care Active Problems:   Hypothyroidism (acquired)   UTI (urinary tract infection)   AMS (altered mental status)   Hypernatremia   Comfort Care Measures - transitioned to comfort status on 10/17. Advanced dementia End-of-life care Hospice liaison following. Transitioned to comfort care 10/17. Comfort care per orders.  Family initially requested to be contacted before any morphine, ativan or other sedating meds are give. 10/24: Patient started on morphine infusion due to tachycardia and tachypnea, also at family's request  Notify MD/DO if signs of pain, distress or discomfort.   Covid-19 infection - Symptoms (fever) and positive test on 10/16.  No oxygen requirement.  Day 10 of isolation will be 10/26. Contact/airborne precautions.   Dehydration Acute metabolic encephalopathy Hypernatremia, chronic Dysphagia Advanced dementia appears to be cause of above issues.  Sodium normalized with IV hydration.  Remains somnolent, dysphagia is severe.  IV fluids have been stopped  as per comfort care status   Oral secretions - scopolamine patch.  Improved.     Pyuria - No overt UTI symptoms.  Urine culture w/ multiple species.  Treated with 3 days ceftriaxone.   Hypokalemia - Resolved w/ addition of kcl to fluids   Hypothyroidism - TSH low, free T4 elevated.  On levothyroxine 125 mcg as outpt Hold levothyroxine per comfort care status.  Patient BMI: Body mass index is 24.63 kg/m.   DVT prophylaxis:    Diet:  Diet Orders (From admission, onward)     Start     Ordered   08/10/21 1737  Diet NPO time specified  Diet effective now       Comments: May have magic cups or thickened liquids as wanted   08/10/21 1737              Code Status: DNR   Subjective 08/11/21    Patient patient sleeping when seen but appears breathing is somewhat tachypneic.  No family at bedside during encounter.  No acute events reported  Later today, contacted by patient's nurse who stated family now comfortable with use of morphine for comfort care, later requested she be started on infusion.   Disposition Plan & Communication   Status is: Inpatient  Remains inpatient appropriate because: On comfort care measures, on airborne and contact precautions for COVID infection.  Anticipate discharge with hospice if hemodynamically stable once off precautions   Family Communication: Daughters at bedside on rounds 10/19   Consults, Procedures, Significant Events   Consultants:  Palliative care Hospice  Procedures:  None  Antimicrobials:  Anti-infectives (From admission, onward)    Start     Dose/Rate Route Frequency Ordered Stop   07/31/21 2000  cefTRIAXone (ROCEPHIN) 1 g in sodium chloride 0.9 % 100 mL IVPB  Status:  Discontinued        1 g 200 mL/hr over 30 Minutes Intravenous Every 24 hours 08/15/2021 2016 08/01/21 1139   07/27/2021 1830  cefTRIAXone (ROCEPHIN) 1 g in sodium chloride 0.9 % 100 mL IVPB        1 g 200 mL/hr over 30 Minutes Intravenous  Once 07/31/2021  1822 08/18/2021 1959         Micro    Objective   Vitals:   08/09/21 0615 08/10/21 0543 08/10/21 2141 08/11/21 0543  BP: (!) 127/56 127/75 126/77 (!) 114/57  Pulse: 93 (!) 101 (!) 42 (!) 108  Resp: 16 16    Temp: 98.1 F (36.7 C) 98.3 F (36.8 C) 98.4 F (36.9 C) 98.7 F (37.1 C)  TempSrc: Oral Oral Oral Oral  SpO2: 96% 93% (!) 89% 92%  Weight:      Height:        Intake/Output Summary (Last 24 hours) at 08/11/2021 1642 Last data filed at 08/10/2021 1900 Gross per 24 hour  Intake --  Output 450 ml  Net -450 ml    Filed Weights   08/10/2021 1402 08/02/21 0422 08/03/21 0455  Weight: 59 kg 57.7 kg 57.2 kg    Physical Exam:  General exam: Patient sleeping comfortably, no acute distress Respiratory system: Appears tachypneic but without accessory muscle use, lungs clear, on room air Cardiovascular system: RRR, no edema Central nervous system: Patient sleeping and unresponsive, does not follow commands due to advanced dementia   Labs   Data Reviewed: I have personally reviewed following labs and imaging studies  CBC: No results for input(s): WBC, NEUTROABS, HGB, HCT, MCV, PLT in the last 168 hours.  Basic Metabolic Panel: No results for input(s): NA, K, CL, CO2, GLUCOSE, BUN, CREATININE, CALCIUM, MG, PHOS in the last 168 hours.  GFR: Estimated Creatinine Clearance: 45.2 mL/min (by C-G formula based on SCr of 0.55 mg/dL). Liver Function Tests: No results for input(s): AST, ALT, ALKPHOS, BILITOT, PROT, ALBUMIN in the last 168 hours.  No results for input(s): LIPASE, AMYLASE in the last 168 hours. No results for input(s): AMMONIA in the last 168 hours.  Coagulation Profile: No results for input(s): INR, PROTIME in the last 168 hours. Cardiac Enzymes: No results for input(s): CKTOTAL, CKMB, CKMBINDEX, TROPONINI in the last 168 hours. BNP (last 3 results) No results for input(s): PROBNP in the last 8760 hours. HbA1C: No results for input(s): HGBA1C in the last  72 hours. CBG: No results for input(s): GLUCAP in the last 168 hours. Lipid Profile: No results for input(s): CHOL, HDL, LDLCALC, TRIG, CHOLHDL, LDLDIRECT in the last 72 hours. Thyroid Function Tests: No results for input(s): TSH, T4TOTAL, FREET4, T3FREE, THYROIDAB in the last 72 hours. Anemia Panel: No results for input(s): VITAMINB12, FOLATE, FERRITIN, TIBC, IRON, RETICCTPCT in the last 72 hours. Sepsis Labs: No results for input(s): PROCALCITON, LATICACIDVEN in the last 168 hours.   Recent Results (from the past 240 hour(s))  CULTURE, BLOOD (ROUTINE X 2) w Reflex to ID Panel     Status: None   Collection Time: 08/03/21  5:12 PM   Specimen: BLOOD  Result Value Ref Range Status   Specimen Description BLOOD RIGHT HAND  Final   Special Requests   Final    BOTTLES DRAWN AEROBIC  AND ANAEROBIC Blood Culture adequate volume   Culture   Final    NO GROWTH 5 DAYS Performed at [facility], [address] [place], [place] [address]    Report Status 08/08/2021 FINAL  Final  CULTURE, BLOOD (ROUTINE X 2) w Reflex to ID Panel     Status: None   Collection Time: 08/03/21  5:14 PM   Specimen: BLOOD  Result Value Ref Range Status   Specimen Description BLOOD LEFT HAND  Final   Special Requests   Final    BOTTLES DRAWN AEROBIC ONLY Blood Culture results may not be optimal due to an inadequate volume of blood received in culture bottles   Culture   Final    NO GROWTH 5 DAYS Performed at [facility], [address] [place], [place] [address]    Report Status 08/08/2021 FINAL  Final  Resp Panel by RT-PCR (Flu A&B, Covid) Nasopharyngeal Swab     Status: Abnormal   Collection Time: 08/03/21  5:42 PM   Specimen: Nasopharyngeal Swab; Nasopharyngeal(NP) swabs in vial transport medium  Result Value Ref Range Status   SARS Coronavirus 2 by RT PCR POSITIVE (A) NEGATIVE Final    Comment: RESULT CALLED TO, READ BACK BY AND VERIFIED WITH: RENEE REID @ 1930 08/03/21  LFD (NOTE) SARS-CoV-2 target nucleic acids are DETECTED.  The SARS-CoV-2 RNA is generally detectable in upper respiratory specimens during the acute phase of infection. Positive results are indicative of the presence of the identified virus, but do not rule out bacterial infection or co-infection with other pathogens not detected by the test. Clinical correlation with patient history and other diagnostic information is necessary to determine patient infection status. The expected result is Negative.  Fact Sheet for Patients: BloggerCourse.com  Fact Sheet for Healthcare Providers: SeriousBroker.it  This test is not yet approved or cleared by the [place] FDA and  has been authorized for detection and/or diagnosis of SARS-CoV-2 by FDA under an Emergency Use Authorization (EUA).  This EUA will remain in effect (meaning this test can be [place] ed) for the duration of  the COVID-19 declaration under Section 564(b)(1) of the Act, 21 U.S.C. section 360bbb-3(b)(1), unless the authorization is terminated or revoked sooner.     Influenza A by PCR NEGATIVE NEGATIVE Final   Influenza B by PCR NEGATIVE NEGATIVE Final    Comment: (NOTE) The Xpert Xpress SARS-CoV-2/FLU/RSV plus assay is intended as an aid in the diagnosis of influenza from Nasopharyngeal swab specimens and should not be used as a sole basis for treatment. Nasal washings and aspirates are unacceptable for Xpert Xpress SARS-CoV-2/FLU/RSV testing.  Fact Sheet for Patients: BloggerCourse.com  Fact Sheet for Healthcare Providers: SeriousBroker.it  This test is not yet approved or cleared by the [place] FDA and has been authorized for detection and/or diagnosis of SARS-CoV-2 by FDA under an Emergency Use Authorization (EUA). This EUA will remain in effect (meaning this test can be used) for the duration of the COVID-19  declaration under Section 564(b)(1) of the Act, 21 U.S.C. section 360bbb-3(b)(1), unless the authorization is terminated or revoked.  Performed at [facility], [address], [place], [place] [address]   Urine Culture     Status: None   Collection Time: 08/03/21  5:44 PM   Specimen: Urine, Random  Result Value Ref Range Status   Specimen Description   Final    URINE, RANDOM Performed at [facility], [address] [place], [place] [address]  Special Requests   Final    NONE Performed at [facility], [address], [place], [place] [address]    Culture   Final    NO GROWTH Performed at [facility], [address] [place], [place] [address]    Report Status 08/04/2021 FINAL  Final      Imaging Studies   No results found.   Medications   Scheduled Meds:  scopolamine  1 patch Transdermal Q72H   Continuous Infusions:  morphine 2 mg/hr (08/11/21 1513)       LOS: 12 days    Time spent: 20 minutes    Pennie Banter, DO [facility]  08/11/2021, 4:42 PM      If 7PM-7AM, please contact night-coverage. How to contact the [facility] Attending or Consulting provider 7A - 7P or covering provider during after hours 7P -7A, for this patient?    Check the care team in [facility] and look for a) attending/consulting [facility] provider listed and b) the [facility] team listed Log into [email or web address] and use [facility] universal password to access. If you do not have the password, please contact the hospital operator. Locate the [facility] provider you are looking for under [facility] and page to a number that you can be directly reached. If you still have difficulty reaching the provider, please page the [facility] (Director on Call) for the Hospitalists listed on amion for assistance.

## 2021-08-11 NOTE — Progress Notes (Signed)
Morphine drip, per family request, infusing without difficulties.

## 2021-08-12 DIAGNOSIS — R638 Other symptoms and signs concerning food and fluid intake: Secondary | ICD-10-CM | POA: Diagnosis present

## 2021-08-12 DIAGNOSIS — F0280 Dementia in other diseases classified elsewhere without behavioral disturbance: Secondary | ICD-10-CM | POA: Diagnosis present

## 2021-08-12 DIAGNOSIS — E860 Dehydration: Secondary | ICD-10-CM | POA: Diagnosis present

## 2021-08-19 NOTE — Progress Notes (Signed)
Patient taken to morgue. 

## 2021-08-19 NOTE — Care Management Important Message (Signed)
Important Message  Patient Details  Name: Sydney Turner MRN: [id number] Date of Birth: April 11, 1941   Medicare Important Message Given:  Other (see comment)  Patient on comfort care and awaiting bed at the [facility].    Olegario Messier A [name] Aug 22, 2021, 9:08 AM

## 2021-08-19 NOTE — Progress Notes (Signed)
  Chaplain On-Call responded to call from Unit Secretary Erasmo Downer who reported the death of the patient and the presence of family members.  Chaplain met RN Tiffany who provided medical and social history about the patient and family. Tiffany assisted this Chaplain with necessary PPE for entering the room due to the patient's diagnosis with COVID.  Chaplain met patient's daughters Sydney Turner and Sydney Turner at bedside. They described their caring relationships with the patient, and lovingly remembered the patient's life of generosity and hospitality prior to her diagnosis of Alzheimer's. They stated also that they have been strengthened by recalling the patient's life of faith.  Chaplain provided much spiritual and emotional support and prayer.  Chaplain Pollyann Samples M.Div., [facility]

## 2021-08-19 NOTE — Progress Notes (Signed)
Patient's daughter [name] notified of patient's passing.

## 2021-08-19 NOTE — Progress Notes (Signed)
Patient noted to be absent of spontaneous respirations, no apical heart rate, no carotid pulse. DNR/Comfort care status. Pronounced by Dr. Esaw Grandchild.

## 2021-08-19 NOTE — Death Summary Note (Addendum)
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 female seen in ed with complaints of mental status.  Patient is a resident of a skilled nursing facility where blood work was obtained on was concerning for dehydration.  Per report family members concerned that patient may have a urinary tract infection and has had decreased p.o. intake and urine output for the past few days and has been declining over past few weeks.  Patient at baseline is nonverbal bedridden and does not follow commands.  CODE STATUS is DO NOT RESUSCITATE. HPI /ROS is otherwise unobtainable due to dementia and age. Per daughter mom has advanced dementia and answers yes and no questions. Limited mobility."  Hospital Course:  Urinalysis was suspicious for UTI with pyruria, but urine culture was no growth.  She was treated empirically with Rocephin early during admission. Hypernatremia was treated with IV hydration and resolved. Over the 12 days of admission, patient's oral intake steadily declined.  She remained somnolent.  During oral intake, was noted to have severe dysphagia.  Patient was being followed by palliative care in the outpatient setting for advanced dementia due to Alzheimer's disease.  Upon admission to the hospital, patient appeared in  final stages and near end of life.  Family together made decision to transition the patient to comfort care.  Patient incidentally tested positive for Covid-19, but displayed no respiratory, GI or other signs or symptoms of Covid-19 infection.  She was placed on airborne and contact precautions during admission.  Comfort medications were started and up-titrated as needed.  Patient was started on morphine infusion at family's request on 08/11/2021 due to worsening tachypnea and tachycardia. This morning, patient noted to have passed away when seen on rounds.   Time: 0949  Signed:  Pennie Banter, DO  [facility] September 07, 2021, 12:36 PM

## 2021-08-19 NOTE — Progress Notes (Signed)
Hospital chaplain at beside for family comfort.

## 2021-08-19 DEATH — deceased

## 2022-05-10 IMAGING — DX DG CHEST 1V PORT
1 series · 1 of 1 positions shown · non-contrast
Comparison: 07/30/2021

CLINICAL DATA: Fevers and altered mental status, initial encounter

EXAM:
PORTABLE CHEST 1 VIEW

[chest ap]
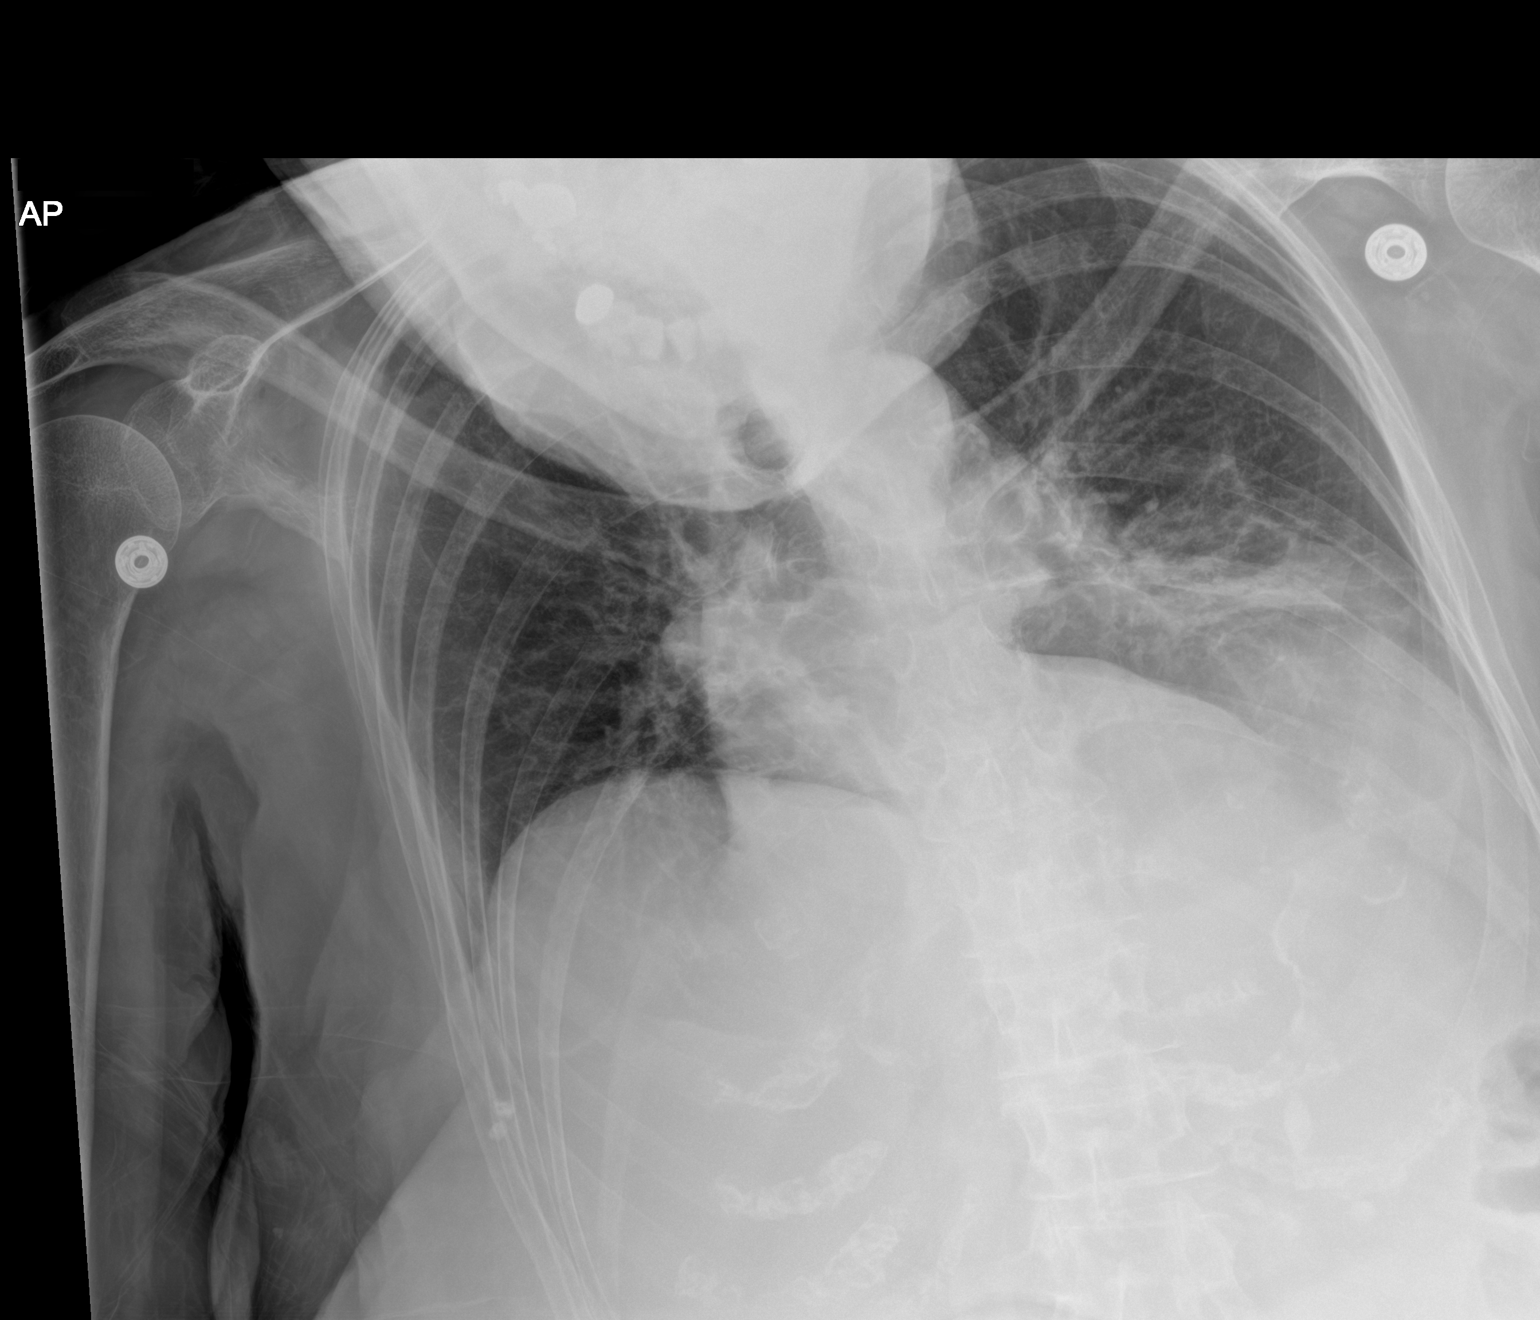

[1 of 1 positions shown; findings below may reference images not displayed]

FINDINGS: Cardiac shadow is stable. Increasing left basilar infiltrate is
noted when compare with the prior exam. The lungs are otherwise
clear. No acute bony abnormality is noted.
IMPRESSION: Increase in left basilar airspace opacity consistent with
infiltrate.
